# Patient Record
Sex: Female | Born: 1965 | Race: White | Hispanic: No | Marital: Married | Smoking: Current every day smoker
Health system: Southern US, Community
[De-identification: ages and names within clinical notes are randomized; demographics above are authoritative.]

## PROBLEM LIST (undated history)

## (undated) ENCOUNTER — Emergency Department (HOSPITAL_COMMUNITY): Admission: EM | Payer: PRIVATE HEALTH INSURANCE | Source: Home / Self Care

## (undated) DIAGNOSIS — F32A Depression, unspecified: Secondary | ICD-10-CM

## (undated) DIAGNOSIS — F419 Anxiety disorder, unspecified: Secondary | ICD-10-CM

## (undated) DIAGNOSIS — F329 Major depressive disorder, single episode, unspecified: Secondary | ICD-10-CM

## (undated) DIAGNOSIS — K219 Gastro-esophageal reflux disease without esophagitis: Secondary | ICD-10-CM

## (undated) HISTORY — DX: Depression, unspecified: F32.A

## (undated) HISTORY — DX: Anxiety disorder, unspecified: F41.9

## (undated) HISTORY — DX: Major depressive disorder, single episode, unspecified: F32.9

---

## 1997-05-13 ENCOUNTER — Inpatient Hospital Stay (HOSPITAL_COMMUNITY): Admission: AD | Admit: 1997-05-13 | Discharge: 1997-05-16 | Payer: Self-pay | Admitting: *Deleted

## 2003-01-19 HISTORY — PX: TUBAL LIGATION: SHX77

## 2006-06-21 ENCOUNTER — Other Ambulatory Visit: Admission: RE | Admit: 2006-06-21 | Discharge: 2006-06-21 | Payer: Self-pay | Admitting: Obstetrics and Gynecology

## 2008-02-06 ENCOUNTER — Other Ambulatory Visit: Admission: RE | Admit: 2008-02-06 | Discharge: 2008-02-06 | Payer: Self-pay | Admitting: Obstetrics and Gynecology

## 2008-02-12 ENCOUNTER — Encounter: Admission: RE | Admit: 2008-02-12 | Discharge: 2008-02-12 | Payer: Self-pay | Admitting: Obstetrics and Gynecology

## 2009-06-12 ENCOUNTER — Ambulatory Visit (HOSPITAL_COMMUNITY): Payer: Self-pay | Admitting: Psychiatry

## 2009-06-24 ENCOUNTER — Ambulatory Visit (HOSPITAL_COMMUNITY): Payer: Self-pay | Admitting: Psychiatry

## 2009-06-26 ENCOUNTER — Ambulatory Visit (HOSPITAL_COMMUNITY): Payer: Self-pay | Admitting: Psychiatry

## 2009-07-02 ENCOUNTER — Ambulatory Visit (HOSPITAL_COMMUNITY): Payer: Self-pay | Admitting: Psychiatry

## 2009-07-22 ENCOUNTER — Ambulatory Visit (HOSPITAL_COMMUNITY): Payer: Self-pay | Admitting: Psychiatry

## 2009-07-28 ENCOUNTER — Ambulatory Visit (HOSPITAL_COMMUNITY): Payer: Self-pay | Admitting: Psychiatry

## 2009-08-19 ENCOUNTER — Ambulatory Visit (HOSPITAL_COMMUNITY): Payer: Self-pay | Admitting: Psychiatry

## 2009-08-26 ENCOUNTER — Ambulatory Visit (HOSPITAL_COMMUNITY): Payer: Self-pay | Admitting: Psychiatry

## 2009-09-23 ENCOUNTER — Ambulatory Visit (HOSPITAL_COMMUNITY): Payer: Self-pay | Admitting: Psychiatry

## 2009-09-30 ENCOUNTER — Other Ambulatory Visit: Admission: RE | Admit: 2009-09-30 | Discharge: 2009-09-30 | Payer: Self-pay | Admitting: Obstetrics and Gynecology

## 2009-10-28 ENCOUNTER — Ambulatory Visit (HOSPITAL_COMMUNITY): Payer: Self-pay | Admitting: Psychiatry

## 2010-01-01 ENCOUNTER — Ambulatory Visit (HOSPITAL_COMMUNITY): Payer: Self-pay | Admitting: Psychiatry

## 2010-02-03 ENCOUNTER — Ambulatory Visit (HOSPITAL_COMMUNITY)
Admission: RE | Admit: 2010-02-03 | Discharge: 2010-02-03 | Payer: Self-pay | Source: Home / Self Care | Attending: Psychiatry | Admitting: Psychiatry

## 2010-03-03 ENCOUNTER — Encounter (HOSPITAL_COMMUNITY): Payer: Self-pay | Admitting: Psychiatry

## 2010-03-17 ENCOUNTER — Encounter (INDEPENDENT_AMBULATORY_CARE_PROVIDER_SITE_OTHER): Payer: 59 | Admitting: Psychiatry

## 2010-03-17 DIAGNOSIS — F319 Bipolar disorder, unspecified: Secondary | ICD-10-CM

## 2010-05-14 ENCOUNTER — Encounter (INDEPENDENT_AMBULATORY_CARE_PROVIDER_SITE_OTHER): Payer: 59 | Admitting: Psychiatry

## 2010-05-14 DIAGNOSIS — F3189 Other bipolar disorder: Secondary | ICD-10-CM

## 2010-07-14 ENCOUNTER — Encounter (HOSPITAL_COMMUNITY): Payer: 59 | Admitting: Psychiatry

## 2011-01-02 ENCOUNTER — Other Ambulatory Visit (HOSPITAL_COMMUNITY): Payer: Self-pay | Admitting: Psychiatry

## 2011-05-27 ENCOUNTER — Other Ambulatory Visit: Payer: Self-pay | Admitting: Family Medicine

## 2011-05-27 DIAGNOSIS — Z1231 Encounter for screening mammogram for malignant neoplasm of breast: Secondary | ICD-10-CM

## 2011-07-06 ENCOUNTER — Ambulatory Visit: Payer: 59 | Attending: Orthopedic Surgery | Admitting: Physical Therapy

## 2011-07-06 DIAGNOSIS — M25619 Stiffness of unspecified shoulder, not elsewhere classified: Secondary | ICD-10-CM | POA: Insufficient documentation

## 2011-07-06 DIAGNOSIS — IMO0001 Reserved for inherently not codable concepts without codable children: Secondary | ICD-10-CM | POA: Insufficient documentation

## 2011-07-06 DIAGNOSIS — M25519 Pain in unspecified shoulder: Secondary | ICD-10-CM | POA: Insufficient documentation

## 2011-07-20 ENCOUNTER — Ambulatory Visit: Payer: 59 | Attending: Orthopedic Surgery | Admitting: Physical Therapy

## 2011-07-20 DIAGNOSIS — M25519 Pain in unspecified shoulder: Secondary | ICD-10-CM | POA: Insufficient documentation

## 2011-07-20 DIAGNOSIS — M25619 Stiffness of unspecified shoulder, not elsewhere classified: Secondary | ICD-10-CM | POA: Insufficient documentation

## 2011-07-20 DIAGNOSIS — IMO0001 Reserved for inherently not codable concepts without codable children: Secondary | ICD-10-CM | POA: Insufficient documentation

## 2011-07-27 ENCOUNTER — Encounter: Payer: 59 | Admitting: Physical Therapy

## 2011-08-03 ENCOUNTER — Ambulatory Visit: Payer: 59 | Admitting: Physical Therapy

## 2011-08-18 ENCOUNTER — Encounter: Payer: 59 | Admitting: Physical Therapy

## 2011-09-01 ENCOUNTER — Encounter: Payer: 59 | Admitting: Physical Therapy

## 2011-10-25 ENCOUNTER — Ambulatory Visit
Admission: RE | Admit: 2011-10-25 | Discharge: 2011-10-25 | Disposition: A | Discharge status: Home / Self Care | Payer: 59 | Source: Ambulatory Visit | Attending: Family Medicine | Admitting: Family Medicine

## 2011-10-25 DIAGNOSIS — Z1231 Encounter for screening mammogram for malignant neoplasm of breast: Secondary | ICD-10-CM

## 2011-10-27 ENCOUNTER — Other Ambulatory Visit: Payer: Self-pay | Admitting: Family Medicine

## 2011-10-27 DIAGNOSIS — R928 Other abnormal and inconclusive findings on diagnostic imaging of breast: Secondary | ICD-10-CM

## 2011-11-22 ENCOUNTER — Other Ambulatory Visit: Payer: Self-pay | Admitting: Family Medicine

## 2011-11-22 ENCOUNTER — Ambulatory Visit
Admission: RE | Admit: 2011-11-22 | Discharge: 2011-11-22 | Disposition: A | Discharge status: Home / Self Care | Payer: 59 | Source: Ambulatory Visit | Attending: Family Medicine | Admitting: Family Medicine

## 2011-11-22 DIAGNOSIS — R928 Other abnormal and inconclusive findings on diagnostic imaging of breast: Secondary | ICD-10-CM

## 2011-12-03 ENCOUNTER — Ambulatory Visit
Admission: RE | Admit: 2011-12-03 | Discharge: 2011-12-03 | Disposition: A | Discharge status: Home / Self Care | Payer: 59 | Source: Ambulatory Visit | Attending: Family Medicine | Admitting: Family Medicine

## 2011-12-03 DIAGNOSIS — R928 Other abnormal and inconclusive findings on diagnostic imaging of breast: Secondary | ICD-10-CM

## 2012-05-10 ENCOUNTER — Other Ambulatory Visit: Payer: Self-pay | Admitting: Family Medicine

## 2012-05-10 ENCOUNTER — Other Ambulatory Visit: Payer: Self-pay | Admitting: Nurse Practitioner

## 2012-09-07 ENCOUNTER — Encounter: Payer: Self-pay | Admitting: Nurse Practitioner

## 2012-09-07 ENCOUNTER — Ambulatory Visit (INDEPENDENT_AMBULATORY_CARE_PROVIDER_SITE_OTHER): Payer: 59 | Admitting: Nurse Practitioner

## 2012-09-07 VITALS — BP 102/64 | Ht 63.0 in | Wt 118.0 lb

## 2012-09-07 DIAGNOSIS — G4700 Insomnia, unspecified: Secondary | ICD-10-CM

## 2012-09-07 DIAGNOSIS — F319 Bipolar disorder, unspecified: Secondary | ICD-10-CM

## 2012-09-07 MED ORDER — DIVALPROEX SODIUM ER 500 MG PO TB24: ORAL_TABLET | ORAL | Status: DC

## 2012-09-07 MED ORDER — ALPRAZOLAM 0.5 MG PO TABS: 0.5000 mg | ORAL_TABLET | Freq: Every evening | ORAL | Status: DC | PRN

## 2012-09-07 MED ORDER — QUETIAPINE FUMARATE 25 MG PO TABS: ORAL_TABLET | ORAL | Status: DC

## 2012-09-11 ENCOUNTER — Encounter: Payer: Self-pay | Admitting: Nurse Practitioner

## 2012-09-11 DIAGNOSIS — F319 Bipolar disorder, unspecified: Secondary | ICD-10-CM | POA: Insufficient documentation

## 2012-09-11 NOTE — Assessment & Plan Note (Signed)
Meds ordered this encounter  Medications  . DISCONTD: QUEtiapine Fumarate (SEROQUEL PO)    Sig: Take by mouth.  . DISCONTD: ALPRAZolam (XANAX) 0.5 MG tablet    Sig: Take 0.5 mg by mouth at bedtime as needed for sleep.  Marland Kitchen ALPRAZolam (XANAX) 0.5 MG tablet    Sig: Take 1 tablet (0.5 mg total) by mouth at bedtime as needed for sleep.    Dispense:  30 tablet    Refill:  5    Order Specific Question:  Supervising Provider    Answer:  Merlyn Albert [[id number]]  . QUEtiapine (SEROQUEL) 25 MG tablet    Sig: 3 po QHS    Dispense:  270 tablet    Refill:  1    Order Specific Question:  Supervising Provider    Answer:  Merlyn Albert [[id number]]  . divalproex (DEPAKOTE ER) 500 MG 24 hr tablet    Sig: 2 po qd    Dispense:  180 tablet    Refill:  1    Order Specific Question:  Supervising Provider    Answer:  Merlyn Albert [[id number]]   If no problems recheck in 6 months, call back sooner if needed. Reminded patient about importance of regular preventive health physicals and routine lab wo

## 2012-09-11 NOTE — Progress Notes (Signed)
Subjective:  Patient presents for recheck on her bipolar disorder, depression and anxiety. Symptoms have been stable. Currently regimen seems to be working well using Xanax at bedtime for sleep. Has been under more stress lately due to personal issues. Defers need for psychiatric care at this time.  Objective:   BP 102/64  Ht 5\' 3"  (1.6 m)  Wt 118 lb (53.524 kg)  BMI 20.91 kg/m2 NAD. Alert, oriented. Mildly anxious affect. Lungs clear. Heart regular rate rhythm.  Assessment:Bipolar disorder, unspecified  Insomnia  Plan: Meds ordered this encounter  Medications  . DISCONTD: QUEtiapine Fumarate (SEROQUEL PO)    Sig: Take by mouth.  . DISCONTD: ALPRAZolam (XANAX) 0.5 MG tablet    Sig: Take 0.5 mg by mouth at bedtime as needed for sleep.  Marland Kitchen ALPRAZolam (XANAX) 0.5 MG tablet    Sig: Take 1 tablet (0.5 mg total) by mouth at bedtime as needed for sleep.    Dispense:  30 tablet    Refill:  5    Order Specific Question:  Supervising Provider    Answer:  Merlyn Albert [[id number]]  . QUEtiapine (SEROQUEL) 25 MG tablet    Sig: 3 po QHS    Dispense:  270 tablet    Refill:  1    Order Specific Question:  Supervising Provider    Answer:  Merlyn Albert [[id number]]  . divalproex (DEPAKOTE ER) 500 MG 24 hr tablet    Sig: 2 po qd    Dispense:  180 tablet    Refill:  1    Order Specific Question:  Supervising Provider    Answer:  Merlyn Albert [[id number]]   If no problems recheck in 6 months, call back sooner if needed. Reminded patient about importance of regular preventive health physicals and routine lab work.

## 2012-09-14 ENCOUNTER — Ambulatory Visit (INDEPENDENT_AMBULATORY_CARE_PROVIDER_SITE_OTHER): Payer: 59 | Admitting: Nurse Practitioner

## 2012-09-14 ENCOUNTER — Encounter: Payer: Self-pay | Admitting: Nurse Practitioner

## 2012-09-14 VITALS — BP 102/68 | Temp 98.6°F | Ht 62.0 in | Wt 109.0 lb

## 2012-09-14 DIAGNOSIS — J069 Acute upper respiratory infection, unspecified: Secondary | ICD-10-CM

## 2012-09-15 ENCOUNTER — Ambulatory Visit: Payer: 59 | Admitting: Nurse Practitioner

## 2012-09-16 NOTE — Progress Notes (Signed)
Subjective:  Presents complaints of sore throat and ear pain that began 4 days ago. Slight fever times. No cough runny nose or wheezing. No changes in her headaches. No vomiting diarrhea or abdominal pain. No acid reflux or heartburn.  Objective:   BP 102/68  Temp(Src) 98.6 F (37 C) (Oral)  Ht 5\' 2"  (1.575 m)  Wt 109 lb (49.442 kg)  BMI 19.93 kg/m2 NAD. Alert, oriented. TMs clear effusion, no erythema. Pharynx injected with PND noted. Neck supple with mild soft slightly tender adenopathy. Lungs clear. Heart regular rate rhythm.  Assessment:Acute upper respiratory infections of unspecified site  Plan: Given a written prescription for Z-Pak as directed per patient request. OTC meds as directed for congestion. Call back next week if no improvement, sooner if worse.

## 2012-11-15 ENCOUNTER — Other Ambulatory Visit: Payer: Self-pay

## 2012-11-15 ENCOUNTER — Other Ambulatory Visit: Payer: Self-pay | Admitting: Family Medicine

## 2012-11-15 DIAGNOSIS — Z1231 Encounter for screening mammogram for malignant neoplasm of breast: Secondary | ICD-10-CM

## 2012-12-04 ENCOUNTER — Ambulatory Visit: Payer: 59

## 2012-12-04 ENCOUNTER — Ambulatory Visit (HOSPITAL_COMMUNITY)
Admission: RE | Admit: 2012-12-04 | Discharge: 2012-12-04 | Disposition: A | Discharge status: Home / Self Care | Payer: 59 | Source: Ambulatory Visit | Attending: Family Medicine | Admitting: Family Medicine

## 2012-12-04 DIAGNOSIS — Z1231 Encounter for screening mammogram for malignant neoplasm of breast: Secondary | ICD-10-CM | POA: Insufficient documentation

## 2013-04-07 ENCOUNTER — Other Ambulatory Visit: Payer: Self-pay | Admitting: Nurse Practitioner

## 2013-05-14 ENCOUNTER — Ambulatory Visit (INDEPENDENT_AMBULATORY_CARE_PROVIDER_SITE_OTHER): Payer: PRIVATE HEALTH INSURANCE | Admitting: Nurse Practitioner

## 2013-05-14 ENCOUNTER — Encounter: Payer: Self-pay | Admitting: Nurse Practitioner

## 2013-05-14 VITALS — BP 112/82 | Ht 62.0 in | Wt 119.0 lb

## 2013-05-14 DIAGNOSIS — F319 Bipolar disorder, unspecified: Secondary | ICD-10-CM

## 2013-05-14 MED ORDER — DIVALPROEX SODIUM ER 500 MG PO TB24: ORAL_TABLET | ORAL | Status: DC

## 2013-05-14 MED ORDER — ALPRAZOLAM 0.5 MG PO TABS: 0.5000 mg | ORAL_TABLET | Freq: Three times a day (TID) | ORAL | Status: DC | PRN

## 2013-05-14 MED ORDER — QUETIAPINE FUMARATE 25 MG PO TABS: ORAL_TABLET | ORAL | Status: DC

## 2013-05-16 ENCOUNTER — Encounter: Payer: Self-pay | Admitting: Nurse Practitioner

## 2013-05-16 ENCOUNTER — Other Ambulatory Visit: Payer: Self-pay | Admitting: Nurse Practitioner

## 2013-05-16 DIAGNOSIS — F319 Bipolar disorder, unspecified: Secondary | ICD-10-CM

## 2013-05-16 NOTE — Progress Notes (Signed)
Subjective:  Presents for recheck. Overall symptoms are stable but having more anxiety due to situational stress. Her husband has major health issues. Her other meds are the same, asking for an increase in xanax for now.   Objective:   BP 112/82  Ht 5\' 2"  (1.575 m)  Wt 119 lb (53.978 kg)  BMI 21.76 kg/m2 NAD. Alert,oriented. Thoughts logical, coherent and relevant. Lungs clear. Heart RRR.  Assessment:  Problem List Items Addressed This Visit     Other   Bipolar disorder, unspecified - Primary (Chronic)     Plan:  Meds ordered this encounter  Medications  . QUEtiapine (SEROQUEL) 25 MG tablet    Sig: 3 po QHS    Dispense:  270 tablet    Refill:  0    Order Specific Question:  Supervising Provider    Answer:  Merlyn AlbertLUKING, WILLIAM S [[id number]]  . divalproex (DEPAKOTE ER) 500 MG 24 hr tablet    Sig: 2 po qd    Dispense:  180 tablet    Refill:  0    Order Specific Question:  Supervising Provider    Answer:  Merlyn AlbertLUKING, WILLIAM S [[id number]]  . ALPRAZolam (XANAX) 0.5 MG tablet    Sig: Take 1 tablet (0.5 mg total) by mouth 3 (three) times daily as needed for anxiety.    Dispense:  90 tablet    Refill:  2    May refill monthly    Order Specific Question:  Supervising Provider    Answer:  Merlyn AlbertLUKING, WILLIAM S [[id number]]  discussed our updated policy regarding treatment of bipolar disorder. Patient understands that she must make appointment with mental health for further refills. Given information on local mental health providers. Call back in meantime if any problems. Seek help immediately if severe issues.

## 2013-05-28 ENCOUNTER — Telehealth: Payer: Self-pay | Admitting: Family Medicine

## 2013-05-28 NOTE — Telephone Encounter (Signed)
Spoke with pt, explained that I haven't gotten to her referral as of yet but nothing special required, she may call to set up, [facility] [place], also suggested calling her insurance to get list of providers to choose from, pt will set up appt

## 2013-05-28 NOTE — Telephone Encounter (Signed)
Patient would like to know status on referral to psychiatry.

## 2013-07-10 ENCOUNTER — Ambulatory Visit (INDEPENDENT_AMBULATORY_CARE_PROVIDER_SITE_OTHER): Payer: PRIVATE HEALTH INSURANCE | Admitting: Psychiatry

## 2013-07-10 ENCOUNTER — Encounter (HOSPITAL_COMMUNITY): Payer: Self-pay | Admitting: Psychiatry

## 2013-07-10 VITALS — BP 110/80 | Ht 62.0 in | Wt 114.0 lb

## 2013-07-10 DIAGNOSIS — F410 Panic disorder [episodic paroxysmal anxiety] without agoraphobia: Secondary | ICD-10-CM

## 2013-07-10 DIAGNOSIS — F319 Bipolar disorder, unspecified: Secondary | ICD-10-CM

## 2013-07-10 DIAGNOSIS — F39 Unspecified mood [affective] disorder: Secondary | ICD-10-CM

## 2013-07-10 MED ORDER — QUETIAPINE FUMARATE 25 MG PO TABS: ORAL_TABLET | ORAL | Status: DC

## 2013-07-10 MED ORDER — DIVALPROEX SODIUM ER 500 MG PO TB24: ORAL_TABLET | ORAL | Status: DC

## 2013-07-10 MED ORDER — ALPRAZOLAM 0.5 MG PO TABS: 0.5000 mg | ORAL_TABLET | Freq: Three times a day (TID) | ORAL | Status: DC | PRN

## 2013-07-10 MED ORDER — ALPRAZOLAM 0.5 MG PO TABS: 0.5000 mg | ORAL_TABLET | ORAL | Status: DC | PRN
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
## 2013-08-08 LAB — HEPATIC FUNCTION PANEL
ALT: <8 U/L (ref 0–35)
AST: 13 U/L (ref 0–37)
Albumin: 4.2 g/dL (ref 3.5–5.2)
Alkaline Phosphatase: 43 U/L (ref 39–117)
BILIRUBIN DIRECT: 0.1 mg/dL (ref 0.0–0.3)
BILIRUBIN TOTAL: 0.4 mg/dL (ref 0.2–1.2)
Indirect Bilirubin: 0.3 mg/dL (ref 0.2–1.2)
Total Protein: 6.1 g/dL (ref 6.0–8.3)

## 2013-08-08 LAB — VALPROIC ACID LEVEL: Valproic Acid Lvl: 86.6 ug/mL (ref 50.0–100.0)

## 2013-09-26 ENCOUNTER — Telehealth (HOSPITAL_COMMUNITY): Payer: Self-pay | Admitting: *Deleted

## 2013-09-26 NOTE — Telephone Encounter (Signed)
Pt calling stating she only have 1 night of her Depakote and Xanax. Pt states have an up coming appt and 10-11-13. Pt last received refills for Xanax (TID) 07-10-13 with 2 refills. Pt Depakote was last filled 07-10-13 180 tablets with 0 refills. Pt was wondering if Dr. Tenny Craw could fill these medications before appt date?

## 2013-09-26 NOTE — Telephone Encounter (Signed)
Please call her pharmacy to see what is going on. She should not be out of meds

## 2013-09-27 NOTE — Telephone Encounter (Signed)
Spoke with pt about Dr. Tenny Craw decisions and she states she called her pharmacy. Per pt, her pharmacy informed her they do have refills for her.

## 2013-10-11 ENCOUNTER — Ambulatory Visit (INDEPENDENT_AMBULATORY_CARE_PROVIDER_SITE_OTHER): Payer: PRIVATE HEALTH INSURANCE | Admitting: Psychiatry

## 2013-10-11 ENCOUNTER — Encounter (HOSPITAL_COMMUNITY): Payer: Self-pay | Admitting: Psychiatry

## 2013-10-11 VITALS — BP 123/87 | HR 95 | Ht 62.0 in | Wt 113.6 lb

## 2013-10-11 DIAGNOSIS — F39 Unspecified mood [affective] disorder: Secondary | ICD-10-CM

## 2013-10-11 DIAGNOSIS — F410 Panic disorder [episodic paroxysmal anxiety] without agoraphobia: Secondary | ICD-10-CM

## 2013-10-11 DIAGNOSIS — F319 Bipolar disorder, unspecified: Secondary | ICD-10-CM

## 2013-10-11 MED ORDER — DIVALPROEX SODIUM ER 500 MG PO TB24: ORAL_TABLET | ORAL | Status: DC

## 2013-10-11 MED ORDER — ALPRAZOLAM 0.5 MG PO TABS: 0.5000 mg | ORAL_TABLET | Freq: Three times a day (TID) | ORAL | Status: DC | PRN

## 2013-10-11 MED ORDER — QUETIAPINE FUMARATE 25 MG PO TABS: ORAL_TABLET | ORAL | Status: DC
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
## 2013-10-31 ENCOUNTER — Encounter: Payer: Self-pay | Admitting: Nurse Practitioner

## 2013-10-31 ENCOUNTER — Ambulatory Visit (INDEPENDENT_AMBULATORY_CARE_PROVIDER_SITE_OTHER): Payer: PRIVATE HEALTH INSURANCE | Admitting: Nurse Practitioner

## 2013-10-31 VITALS — BP 110/72 | Ht 62.0 in | Wt 119.0 lb

## 2013-10-31 DIAGNOSIS — Z79899 Other long term (current) drug therapy: Secondary | ICD-10-CM

## 2013-10-31 DIAGNOSIS — Z0000 Encounter for general adult medical examination without abnormal findings: Secondary | ICD-10-CM

## 2013-10-31 DIAGNOSIS — R5383 Other fatigue: Secondary | ICD-10-CM

## 2013-10-31 DIAGNOSIS — Z139 Encounter for screening, unspecified: Secondary | ICD-10-CM

## 2013-10-31 DIAGNOSIS — Z01419 Encounter for gynecological examination (general) (routine) without abnormal findings: Secondary | ICD-10-CM

## 2013-10-31 MED ORDER — LIDOCAINE-HYDROCORTISONE ACE 3-0.5 % RE CREA: 1.0000 | TOPICAL_CREAM | Freq: Two times a day (BID) | RECTAL | Status: DC

## 2013-10-31 NOTE — Progress Notes (Signed)
   Subjective:    Patient ID: Anne Logan, female    DOB: 05/26/1965, 48 y.o.   MRN: [id number]  HPI presents for her wellness exam. Married, same sexual partner. Having regular cycles once a month normal flow 6-7 days. Regular vision and dental exams. Eating healthy. Has a sedentary job. No regular exercise. Gets regular followup with mental health.    Review of Systems  Constitutional: Negative for fever, activity change, appetite change and fatigue.  HENT: Negative for dental problem, ear pain, sinus pressure and sore throat.   Respiratory: Negative for cough, chest tightness, shortness of breath and wheezing.   Cardiovascular: Negative for chest pain.  Gastrointestinal: Positive for constipation. Negative for nausea, vomiting, abdominal pain, diarrhea, blood in stool and abdominal distention.  Genitourinary: Negative for dysuria, urgency, frequency, vaginal discharge, enuresis, difficulty urinating, genital sores, menstrual problem and pelvic pain.  Psychiatric/Behavioral: Positive for dysphoric mood.       Objective:   Physical Exam  Vitals reviewed. Constitutional: She is oriented to person, place, and time. She appears well-developed. No distress.  HENT:  Right Ear: External ear normal.  Left Ear: External ear normal.  Mouth/Throat: Oropharynx is clear and moist.  Neck: Normal range of motion. Neck supple. No tracheal deviation present. No thyromegaly present.  Cardiovascular: Normal rate, regular rhythm and normal heart sounds.  Exam reveals no gallop.   No murmur heard. Pulmonary/Chest: Effort normal and breath sounds normal.  Abdominal: Soft. She exhibits no distension. There is no tenderness.  Genitourinary: Vagina normal and uterus normal. No vaginal discharge found.  External GU no rashes or lesions. 2 tiny skin tags noted on the lower portion of the left labia. Vagina no discharge. Cervix normal limit in appearance. Bimanual exam no tenderness or obvious masses noted.    Musculoskeletal: She exhibits no edema.  Lymphadenopathy:    She has no cervical adenopathy.  Neurological: She is alert and oriented to person, place, and time.  Skin: Skin is warm and dry. No rash noted.  Psychiatric: She has a normal mood and affect. Her behavior is normal.   Breast exam: Some fine nodularity, no dominant masses. Axillae no adenopathy.        Assessment & Plan:  Well woman exam  Other fatigue - Plan: Hepatic function panel, Basic metabolic panel, CBC with Differential, TSH, Vit D  25 hydroxy (rtn osteoporosis monitoring)  Screening - Plan: Lipid panel, Hepatic function panel, Basic metabolic panel  High risk medication use - Plan: Hepatic function panel, Basic metabolic panel  Recommend regular activity and daily calcium/vitamin D supplementation. Defers flu vaccine. Patient states she's had a mammogram this year but it was done in November of last year. Nurse will call her tomorrow to remind her about mammogram. Return in about 1 year (around 11/01/2014).

## 2014-01-14 ENCOUNTER — Ambulatory Visit (HOSPITAL_COMMUNITY): Payer: Self-pay | Admitting: Psychiatry

## 2014-01-17 ENCOUNTER — Encounter: Payer: Self-pay | Admitting: Nurse Practitioner

## 2014-01-17 ENCOUNTER — Ambulatory Visit (INDEPENDENT_AMBULATORY_CARE_PROVIDER_SITE_OTHER): Payer: PRIVATE HEALTH INSURANCE | Admitting: Nurse Practitioner

## 2014-01-17 VITALS — BP 130/78 | Temp 99.0°F | Ht 62.0 in | Wt 117.8 lb

## 2014-01-17 DIAGNOSIS — J300 Vasomotor rhinitis: Secondary | ICD-10-CM

## 2014-01-17 MED ORDER — METHYLPREDNISOLONE ACETATE 40 MG/ML IJ SUSP
40.0000 mg | Freq: Once | INTRAMUSCULAR | Status: AC
  Administered 2014-01-17: 40 mg via INTRAMUSCULAR

## 2014-01-17 MED ORDER — PREDNISONE 20 MG PO TABS: ORAL_TABLET | ORAL | Status: DC

## 2014-01-20 ENCOUNTER — Encounter: Payer: Self-pay | Admitting: Nurse Practitioner

## 2014-01-20 NOTE — Progress Notes (Addendum)
Subjective:  Presents for c/o generalized sinus pressure and ear pain for 2 1/2 weeks. No drainage from ear. No relief with Cortisporin drops. No fever. No relief with Sudafed or Nasacort. No headache or cough. Sore throat.  Objective:   BP 130/78 mmHg  Temp(Src) 99 F (37.2 C) (Oral)  Ht  (1.575 m)  Wt 117 lb 12.8 oz (53.434 kg)  BMI 21.54 kg/m2 NAD. Alert, oriented. Cloudy effusion bilat; no erythema. Pharynx clear. Neck supple with mild anterior adenopathy. Lungs clear. Heart RRR.     Assessment: Vasomotor rhinitis - Plan: methylPREDNISolone acetate (DEPO-MEDROL) injection 40 mg  Plan:  Meds ordered this encounter  Medications  . DISCONTD: predniSONE (DELTASONE) 20 MG tablet    Sig: One po BID x 5 d    Dispense:  10 tablet    Refill:  0    Order Specific Question:  Supervising Provider    Answer:  Merlyn Albert [[id number]]  . methylPREDNISolone acetate (DEPO-MEDROL) injection 40 mg    Sig:   . predniSONE (DELTASONE) 20 MG tablet    Sig: One po BID x 5 d    Dispense:  10 tablet    Refill:  0   Restart nasacort and antihistamine daily. Use Sudafed sparingly as directed.  Call back if worsens or persists. Given Rx for prednisone if needed.

## 2014-01-22 ENCOUNTER — Other Ambulatory Visit: Payer: Self-pay | Admitting: Nurse Practitioner

## 2014-01-22 ENCOUNTER — Ambulatory Visit (INDEPENDENT_AMBULATORY_CARE_PROVIDER_SITE_OTHER): Payer: PRIVATE HEALTH INSURANCE | Admitting: Psychiatry

## 2014-01-22 ENCOUNTER — Encounter (HOSPITAL_COMMUNITY): Payer: Self-pay | Admitting: Psychiatry

## 2014-01-22 VITALS — BP 117/75 | HR 90 | Ht 62.0 in | Wt 116.8 lb

## 2014-01-22 DIAGNOSIS — F3162 Bipolar disorder, current episode mixed, moderate: Secondary | ICD-10-CM

## 2014-01-22 DIAGNOSIS — Z1231 Encounter for screening mammogram for malignant neoplasm of breast: Secondary | ICD-10-CM

## 2014-01-22 DIAGNOSIS — F39 Unspecified mood [affective] disorder: Secondary | ICD-10-CM

## 2014-01-22 DIAGNOSIS — F4310 Post-traumatic stress disorder, unspecified: Secondary | ICD-10-CM

## 2014-01-22 MED ORDER — QUETIAPINE FUMARATE 25 MG PO TABS: ORAL_TABLET | ORAL | Status: DC

## 2014-01-22 MED ORDER — DIVALPROEX SODIUM ER 500 MG PO TB24: ORAL_TABLET | ORAL | Status: DC

## 2014-01-22 MED ORDER — ALPRAZOLAM 0.5 MG PO TABS: 0.5000 mg | ORAL_TABLET | Freq: Three times a day (TID) | ORAL | Status: DC | PRN
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
## 2014-01-24 ENCOUNTER — Telehealth: Payer: Self-pay | Admitting: Nurse Practitioner

## 2014-01-24 NOTE — Telephone Encounter (Signed)
2 options: #1: start Nasacort AQ daily over the counter as directed. AND/OR #2: refer to ENT if pressure continues

## 2014-01-24 NOTE — Telephone Encounter (Signed)
Pt states Anne Logan told her to call back if after she finished her prednisone pack An her ear was still bothering her to let you know  [facility]   No fever, ears stopped up only, no popping in them

## 2014-01-24 NOTE — Telephone Encounter (Signed)
Patient notified and is going to try Nasacort first. She said she would call back to be referred to ENT if the pressure continues.

## 2014-02-25 ENCOUNTER — Ambulatory Visit (HOSPITAL_COMMUNITY): Payer: Self-pay

## 2014-04-08 ENCOUNTER — Ambulatory Visit (HOSPITAL_COMMUNITY): Payer: Self-pay

## 2014-05-20 ENCOUNTER — Encounter (HOSPITAL_COMMUNITY): Payer: Self-pay | Admitting: Psychiatry

## 2014-05-20 ENCOUNTER — Ambulatory Visit (INDEPENDENT_AMBULATORY_CARE_PROVIDER_SITE_OTHER): Payer: PRIVATE HEALTH INSURANCE | Admitting: Psychiatry

## 2014-05-20 VITALS — BP 124/83 | HR 98 | Ht 62.0 in | Wt 122.6 lb

## 2014-05-20 DIAGNOSIS — F39 Unspecified mood [affective] disorder: Secondary | ICD-10-CM | POA: Diagnosis not present

## 2014-05-20 DIAGNOSIS — F410 Panic disorder [episodic paroxysmal anxiety] without agoraphobia: Secondary | ICD-10-CM | POA: Diagnosis not present

## 2014-05-20 DIAGNOSIS — F3162 Bipolar disorder, current episode mixed, moderate: Secondary | ICD-10-CM

## 2014-05-20 MED ORDER — DIVALPROEX SODIUM ER 500 MG PO TB24: ORAL_TABLET | ORAL | Status: DC

## 2014-05-20 MED ORDER — ALPRAZOLAM 0.5 MG PO TABS: 0.5000 mg | ORAL_TABLET | Freq: Three times a day (TID) | ORAL | Status: DC | PRN

## 2014-05-20 MED ORDER — QUETIAPINE FUMARATE 25 MG PO TABS: ORAL_TABLET | ORAL | Status: DC
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
## 2014-05-23 LAB — HEPATIC FUNCTION PANEL
ALK PHOS: 49 U/L (ref 39–117)
ALT: <8 U/L (ref 0–35)
AST: 14 U/L (ref 0–37)
Albumin: 4 g/dL (ref 3.5–5.2)
BILIRUBIN DIRECT: 0.1 mg/dL (ref 0.0–0.3)
BILIRUBIN INDIRECT: 0.2 mg/dL (ref 0.2–1.2)
Total Bilirubin: 0.3 mg/dL (ref 0.2–1.2)
Total Protein: 6.4 g/dL (ref 6.0–8.3)

## 2014-05-23 LAB — VALPROIC ACID LEVEL: Valproic Acid Lvl: 89.4 ug/mL (ref 50.0–100.0)

## 2014-07-01 ENCOUNTER — Ambulatory Visit (HOSPITAL_COMMUNITY)
Admission: RE | Admit: 2014-07-01 | Discharge: 2014-07-01 | Disposition: A | Discharge status: Home / Self Care | Payer: PRIVATE HEALTH INSURANCE | Source: Ambulatory Visit | Attending: Nurse Practitioner | Admitting: Nurse Practitioner

## 2014-07-01 DIAGNOSIS — Z1231 Encounter for screening mammogram for malignant neoplasm of breast: Secondary | ICD-10-CM

## 2014-09-16 ENCOUNTER — Other Ambulatory Visit (HOSPITAL_COMMUNITY): Payer: Self-pay | Admitting: Psychiatry

## 2014-10-10 ENCOUNTER — Encounter: Payer: Self-pay | Admitting: Nurse Practitioner

## 2014-10-10 ENCOUNTER — Ambulatory Visit (INDEPENDENT_AMBULATORY_CARE_PROVIDER_SITE_OTHER): Payer: PRIVATE HEALTH INSURANCE | Admitting: Nurse Practitioner

## 2014-10-10 VITALS — BP 122/82 | Ht 62.0 in | Wt 129.2 lb

## 2014-10-10 DIAGNOSIS — M545 Low back pain, unspecified: Secondary | ICD-10-CM

## 2014-10-10 MED ORDER — CHLORZOXAZONE 500 MG PO TABS: ORAL_TABLET | ORAL | Status: DC

## 2014-10-15 ENCOUNTER — Encounter: Payer: Self-pay | Admitting: Nurse Practitioner

## 2014-10-15 NOTE — Progress Notes (Signed)
Subjective:  Presents for complaints of generalized low back pain for the past 8 weeks. Mainly bothers at nighttime. No specific history of injury. Localized to the lumbar area. No change in bowel or bladder habits. Worse with prolonged sitting bending or lifting. No abdominal pain.  Objective:   BP 122/82 mmHg  Ht  (1.575 m)  Wt 129 lb 3.2 oz (58.605 kg)  BMI 23.63 kg/m2 NAD. Alert, oriented. Lungs clear. Heart regular rate rhythm. Minimal tenderness to palpation of the upper lumbar paraspinal area bilateral. SLR negative bilateral. Reflexes normal limit lower extremities. Good active ROM of the back with minimal tenderness.  Assessment: Bilateral low back pain without sciatica  Plan:  Meds ordered this encounter  Medications  . chlorzoxazone (PARAFON) 500 MG tablet    Sig: One po qhs prn muscle spasms    Dispense:  30 tablet    Refill:  0    Order Specific Question:  Supervising Provider    Answer:  Merlyn Albert [[id number]]   Ice/heat to the back area. Ibuprofen as directed. Given copy of back exercises. Warning signs reviewed. Call back if worsens or persists.

## 2014-11-15 ENCOUNTER — Telehealth: Payer: Self-pay | Admitting: Nurse Practitioner

## 2014-11-15 NOTE — Telephone Encounter (Signed)
Please schedule MRI lumbar spine. Insurance may deny based on my notes

## 2014-11-15 NOTE — Telephone Encounter (Signed)
Called patient and informed [facility]-MRI lumbar spine may be denied based on office notes for last visit. Patient verbalized understanding and stated that she may not be able to do MRI if insurance does not pay for it. I informed patient also that usually no referral is need for to see chiropractor. Patient verbalized understanding and stated if she changes her mind about MRI she will call back.

## 2014-11-15 NOTE — Telephone Encounter (Signed)
Pt called stating that her back is not getting better and was told by Anne Jonesarolyn to call back if not any better. Pt stated that Anne Logan mentioned seeing a chiropractor and doing an mri. Pt would like to have those set up.

## 2014-12-19 ENCOUNTER — Telehealth (HOSPITAL_COMMUNITY): Payer: Self-pay | Admitting: *Deleted

## 2014-12-19 NOTE — Telephone Encounter (Signed)
Pt called stating she is out of her Xanax and need refills. Pt was unable to get on sch sooner due to no open slot for her to be scheduled earlier. Pt f/u appt is 02-03-15. Pt is aware that provider is out of office and have to wait until the 5th of December for a response and pt showed understanding.

## 2014-12-23 NOTE — Telephone Encounter (Signed)
You may send in 30 days with one refill 

## 2014-12-26 ENCOUNTER — Telehealth (HOSPITAL_COMMUNITY): Payer: Self-pay | Admitting: *Deleted

## 2014-12-26 MED ORDER — ALPRAZOLAM 0.5 MG PO TABS: 0.5000 mg | ORAL_TABLET | Freq: Three times a day (TID) | ORAL | Status: DC | PRN

## 2014-12-26 NOTE — Telephone Encounter (Signed)
Per Dr. Tenny Crawoss to call in 30 days with 1 refill of Xanax to pt pharmacy. Called pt pharmacy and spoke with Tripe and he stated that they will get script ready for pt.

## 2014-12-26 NOTE — Telephone Encounter (Signed)
Called pt pharmacy and spoke with Tripe and he stated that they will get script ready for pt

## 2015-02-03 ENCOUNTER — Encounter (HOSPITAL_COMMUNITY): Payer: Self-pay | Admitting: Psychiatry

## 2015-02-03 ENCOUNTER — Ambulatory Visit (INDEPENDENT_AMBULATORY_CARE_PROVIDER_SITE_OTHER): Payer: PRIVATE HEALTH INSURANCE | Admitting: Psychiatry

## 2015-02-03 ENCOUNTER — Ambulatory Visit (HOSPITAL_COMMUNITY): Payer: Self-pay | Admitting: Psychiatry

## 2015-02-03 VITALS — BP 130/99 | HR 100 | Ht 62.0 in | Wt 125.0 lb

## 2015-02-03 DIAGNOSIS — F410 Panic disorder [episodic paroxysmal anxiety] without agoraphobia: Secondary | ICD-10-CM

## 2015-02-03 DIAGNOSIS — F3162 Bipolar disorder, current episode mixed, moderate: Secondary | ICD-10-CM

## 2015-02-03 DIAGNOSIS — F39 Unspecified mood [affective] disorder: Secondary | ICD-10-CM | POA: Diagnosis not present

## 2015-02-03 MED ORDER — DIVALPROEX SODIUM ER 500 MG PO TB24: ORAL_TABLET | ORAL | Status: DC

## 2015-02-03 MED ORDER — QUETIAPINE FUMARATE 25 MG PO TABS: ORAL_TABLET | ORAL | Status: DC

## 2015-02-03 MED ORDER — ALPRAZOLAM 0.5 MG PO TABS: 0.5000 mg | ORAL_TABLET | Freq: Three times a day (TID) | ORAL | Status: DC | PRN
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
## 2015-03-14 ENCOUNTER — Telehealth: Payer: Self-pay | Admitting: Nurse Practitioner

## 2015-03-14 NOTE — Telephone Encounter (Signed)
[facility] 03/14/15

## 2015-03-14 NOTE — Telephone Encounter (Signed)
Pt was referred to a chiropractor by Eber Jones and they are wanting Eber Jones to order an MRI and complete blood work. Pt is wanting to know if this can be done without her coming in to be seen. Please advise. The chiropractor is Reatha Harps in [place]

## 2015-03-14 NOTE — Telephone Encounter (Signed)
Office visit will be needed to evaluate and discuss what to order if no other reason than insurance. She has not been seen since last fall. Thanks.

## 2015-03-14 NOTE — Telephone Encounter (Signed)
Discussed with pt

## 2015-03-25 ENCOUNTER — Ambulatory Visit (INDEPENDENT_AMBULATORY_CARE_PROVIDER_SITE_OTHER): Payer: PRIVATE HEALTH INSURANCE | Admitting: Nurse Practitioner

## 2015-03-25 ENCOUNTER — Encounter: Payer: Self-pay | Admitting: Nurse Practitioner

## 2015-03-25 VITALS — BP 112/74 | Ht 62.0 in | Wt 130.0 lb

## 2015-03-25 DIAGNOSIS — R5383 Other fatigue: Secondary | ICD-10-CM | POA: Diagnosis not present

## 2015-03-25 DIAGNOSIS — M545 Low back pain, unspecified: Secondary | ICD-10-CM

## 2015-03-25 DIAGNOSIS — M62838 Other muscle spasm: Secondary | ICD-10-CM

## 2015-03-25 DIAGNOSIS — Z13220 Encounter for screening for lipoid disorders: Secondary | ICD-10-CM | POA: Diagnosis not present

## 2015-03-25 DIAGNOSIS — M6249 Contracture of muscle, multiple sites: Secondary | ICD-10-CM

## 2015-03-26 ENCOUNTER — Telehealth: Payer: Self-pay | Admitting: Family Medicine

## 2015-03-26 NOTE — Telephone Encounter (Signed)
Pt was supposed to be getting a muscle relaxer med sent to [facility]  After her visit with Eber Jonesarolyn yesterday can we get that info and send this  In please

## 2015-03-27 ENCOUNTER — Encounter: Payer: Self-pay | Admitting: Nurse Practitioner

## 2015-03-27 ENCOUNTER — Other Ambulatory Visit: Payer: Self-pay | Admitting: Nurse Practitioner

## 2015-03-27 MED ORDER — BACLOFEN 10 MG PO TABS: 10.0000 mg | ORAL_TABLET | Freq: Three times a day (TID) | ORAL | Status: DC

## 2015-03-27 NOTE — Progress Notes (Signed)
Subjective:  Presents for complaints of persistent back pain that began several months ago see previous note from 10/10/14. No relief with muscle relaxants, chiropractic treatment, TENS unit, ice/heat applications. Has had adjustments on her back 3 times per week with no improvement. Symptoms only occur at nighttime. Never bother her during the day. A new mattress in November, no improvement in symptoms. Generalized low back pain which will radiate to the upper back area. Describes as a tightness and occasional burning pain. No change in bowel or bladder habits. No abdominal pain. Continues to be under tremendous stress. Generalized fatigue. Did not get lab work ordered in May.  Objective:   BP 112/74 mmHg  Ht 5\' 2"  (1.575 m)  Wt 130 lb (58.968 kg)  BMI 23.77 kg/m2 NAD. Alert, oriented. Lungs clear. Heart regular rate rhythm. Mild diffuse tenderness and tightness noted all along the upper and lower back area. SLR negative bilaterally. Reflexes normal limit lower extremities. Gait normal.  Assessment: Night muscle spasms - Plan: Magnesium  Bilateral low back pain without sciatica  Other fatigue - Plan: CBC with Differential/Platelet, Hepatic function panel, Basic metabolic panel, TSH, VITAMIN D 25 Hydroxy (Vit-D Deficiency, Fractures)  Screening for lipid disorders - Plan: Lipid panel  Plan: Patient to get lab work done as soon as possible. Based on results, will discuss further intervention for her back pain at that time. At end of visit patient mentions she's been having some shoulder issues, recommend recheck with her orthopedic specialist in [place], patient agrees.

## 2015-04-11 ENCOUNTER — Ambulatory Visit: Payer: PRIVATE HEALTH INSURANCE | Admitting: Nurse Practitioner

## 2015-04-15 ENCOUNTER — Other Ambulatory Visit (HOSPITAL_COMMUNITY): Payer: Self-pay | Admitting: Psychiatry

## 2015-04-28 ENCOUNTER — Ambulatory Visit (HOSPITAL_COMMUNITY): Payer: Self-pay | Admitting: Psychiatry

## 2015-04-29 ENCOUNTER — Ambulatory Visit (INDEPENDENT_AMBULATORY_CARE_PROVIDER_SITE_OTHER): Payer: PRIVATE HEALTH INSURANCE | Admitting: Psychiatry

## 2015-04-29 ENCOUNTER — Encounter (HOSPITAL_COMMUNITY): Payer: Self-pay | Admitting: Psychiatry

## 2015-04-29 VITALS — BP 140/83 | HR 105 | Ht 62.0 in | Wt 120.4 lb

## 2015-04-29 DIAGNOSIS — F39 Unspecified mood [affective] disorder: Secondary | ICD-10-CM

## 2015-04-29 DIAGNOSIS — F3162 Bipolar disorder, current episode mixed, moderate: Secondary | ICD-10-CM

## 2015-04-29 DIAGNOSIS — F410 Panic disorder [episodic paroxysmal anxiety] without agoraphobia: Secondary | ICD-10-CM

## 2015-04-29 MED ORDER — QUETIAPINE FUMARATE 25 MG PO TABS: ORAL_TABLET | ORAL | Status: DC

## 2015-04-29 MED ORDER — ALPRAZOLAM 0.5 MG PO TABS: 0.5000 mg | ORAL_TABLET | Freq: Three times a day (TID) | ORAL | Status: DC | PRN

## 2015-04-29 MED ORDER — DIVALPROEX SODIUM ER 500 MG PO TB24: ORAL_TABLET | ORAL | Status: DC
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
## 2015-05-01 ENCOUNTER — Telehealth (HOSPITAL_COMMUNITY): Payer: Self-pay | Admitting: *Deleted

## 2015-05-01 ENCOUNTER — Other Ambulatory Visit: Payer: Self-pay | Admitting: Nurse Practitioner

## 2015-05-01 LAB — CBC WITH DIFFERENTIAL/PLATELET
BASOS ABS: 46 {cells}/uL (ref 0–200)
BASOS PCT: 1 %
Eosinophils Absolute: 138 cells/uL (ref 15–500)
Eosinophils Relative: 3 %
HCT: 44.3 % (ref 35.0–45.0)
Hemoglobin: 14.7 g/dL (ref 11.7–15.5)
Lymphocytes Relative: 33 %
Lymphs Abs: 1518 cells/uL (ref 850–3900)
MCH: 31.7 pg (ref 27.0–33.0)
MCHC: 33.2 g/dL (ref 32.0–36.0)
MCV: 95.5 fL (ref 80.0–100.0)
MONOS PCT: 6 %
MPV: 10.2 fL (ref 7.5–12.5)
Monocytes Absolute: 276 cells/uL (ref 200–950)
NEUTROS ABS: 2622 {cells}/uL (ref 1500–7800)
NEUTROS PCT: 57 %
PLATELETS: 219 10*3/uL (ref 140–400)
RBC: 4.64 MIL/uL (ref 3.80–5.10)
RDW: 14.4 % (ref 11.0–15.0)
WBC: 4.6 10*3/uL (ref 3.8–10.8)

## 2015-05-01 LAB — BASIC METABOLIC PANEL
BUN: 15 mg/dL (ref 7–25)
CHLORIDE: 106 mmol/L (ref 98–110)
CO2: 24 mmol/L (ref 20–31)
Calcium: 9.2 mg/dL (ref 8.6–10.2)
Creat: 0.7 mg/dL (ref 0.50–1.10)
Glucose, Bld: 101 mg/dL — ABNORMAL HIGH (ref 65–99)
POTASSIUM: 4.2 mmol/L (ref 3.5–5.3)
SODIUM: 143 mmol/L (ref 135–146)

## 2015-05-01 LAB — MAGNESIUM: MAGNESIUM: 2.2 mg/dL (ref 1.5–2.5)

## 2015-05-01 LAB — HEPATIC FUNCTION PANEL
ALBUMIN: 4.2 g/dL (ref 3.6–5.1)
ALT: 15 U/L (ref 6–29)
AST: 19 U/L (ref 10–35)
Alkaline Phosphatase: 58 U/L (ref 33–115)
BILIRUBIN INDIRECT: 0.3 mg/dL (ref 0.2–1.2)
Bilirubin, Direct: 0.1 mg/dL (ref <=0.2)
TOTAL PROTEIN: 6.4 g/dL (ref 6.1–8.1)
Total Bilirubin: 0.4 mg/dL (ref 0.2–1.2)

## 2015-05-01 LAB — LIPID PANEL
CHOL/HDL RATIO: 3.7 ratio (ref <=5.0)
CHOLESTEROL: 195 mg/dL (ref 125–200)
HDL: 53 mg/dL (ref >=46)
LDL CALC: 105 mg/dL (ref <130)
TRIGLYCERIDES: 186 mg/dL — AB (ref <150)
VLDL: 37 mg/dL — AB (ref <30)

## 2015-05-01 LAB — TSH: TSH: 0.85 m[IU]/L

## 2015-05-01 NOTE — Telephone Encounter (Signed)
Ruby informed [facility] and they showed understanding.

## 2015-05-01 NOTE — Telephone Encounter (Signed)
Linda from [facility] called, said patient came in this morning with two orders, one from Dr. Gerda DissLuking, and one from Dr. Tenny Crawoss with the same lab order.  Bonita QuinLinda wants to know if they can take your lab order off since she had two orders.

## 2015-05-01 NOTE — Telephone Encounter (Signed)
yes

## 2015-05-02 ENCOUNTER — Ambulatory Visit (HOSPITAL_COMMUNITY): Payer: Self-pay | Admitting: Psychiatry

## 2015-05-02 LAB — VITAMIN D 25 HYDROXY (VIT D DEFICIENCY, FRACTURES): Vit D, 25-Hydroxy: 37 ng/mL (ref 30–100)

## 2015-05-02 LAB — VALPROIC ACID LEVEL: VALPROIC ACID LVL: 83.9 ug/mL (ref 50.0–100.0)

## 2015-06-23 ENCOUNTER — Other Ambulatory Visit: Payer: Self-pay | Admitting: Nurse Practitioner

## 2015-06-23 DIAGNOSIS — Z1231 Encounter for screening mammogram for malignant neoplasm of breast: Secondary | ICD-10-CM

## 2015-07-02 ENCOUNTER — Ambulatory Visit (INDEPENDENT_AMBULATORY_CARE_PROVIDER_SITE_OTHER): Payer: PRIVATE HEALTH INSURANCE | Admitting: Family Medicine

## 2015-07-02 ENCOUNTER — Encounter: Payer: Self-pay | Admitting: Family Medicine

## 2015-07-02 VITALS — BP 110/76 | Temp 98.4°F | Ht 62.0 in | Wt 118.5 lb

## 2015-07-02 DIAGNOSIS — J209 Acute bronchitis, unspecified: Secondary | ICD-10-CM | POA: Diagnosis not present

## 2015-07-02 DIAGNOSIS — J329 Chronic sinusitis, unspecified: Secondary | ICD-10-CM | POA: Diagnosis not present

## 2015-07-02 MED ORDER — AMOXICILLIN-POT CLAVULANATE 875-125 MG PO TABS: 1.0000 | ORAL_TABLET | Freq: Two times a day (BID) | ORAL | Status: AC

## 2015-07-02 NOTE — Progress Notes (Signed)
   Subjective:    Patient ID: Anne Logan, female    DOB: 12/15/1965, 50 y.o.   MRN: [id number]  Sinusitis This is a new problem. The current episode started in the past 7 days. The problem is unchanged. There has been no fever. The pain is moderate. Associated symptoms include congestion, coughing, ear pain, headaches and a sore throat. Past treatments include oral decongestants and spray decongestants. The treatment provided no relief.   Patient states that she has no other concerns at this time.  Alka seltz et cough and cpld ptec pllergy   Pos fe er  Both ears painful   Headache frontal in nature worse with change of position.  Intermittent cough productive nature yellowish phlegm low-grade fever present diminished energy diminished appetite  Review of Systems  HENT: Positive for congestion, ear pain and sore throat.   Respiratory: Positive for cough.   Neurological: Positive for headaches.       Objective:   Physical Exam  Alert, mild malaise. Hydration good Vitals stable. frontal/ maxillary tenderness evident positive nasal congestion. pharynx normal neck supple  lungs clear/no crackles or wheezes. heart regular in rhythm Plus bronchial cough during exam      Assessment & Plan:  Impression rhinosinusitis likely post viral, discussed with patient. plan antibiotics prescribed. Questions answered. Symptomatic care discussed. warning signs discussed. WSL Plus element of bronchitis patient strongly encouraged to stop smoking

## 2015-07-14 ENCOUNTER — Ambulatory Visit (HOSPITAL_COMMUNITY)
Admission: RE | Admit: 2015-07-14 | Discharge: 2015-07-14 | Disposition: A | Discharge status: Home / Self Care | Payer: PRIVATE HEALTH INSURANCE | Source: Ambulatory Visit | Attending: Nurse Practitioner | Admitting: Nurse Practitioner

## 2015-07-14 DIAGNOSIS — Z1231 Encounter for screening mammogram for malignant neoplasm of breast: Secondary | ICD-10-CM | POA: Diagnosis present

## 2015-07-19 ENCOUNTER — Other Ambulatory Visit (HOSPITAL_COMMUNITY): Payer: Self-pay | Admitting: Psychiatry

## 2015-07-21 ENCOUNTER — Telehealth (HOSPITAL_COMMUNITY): Payer: Self-pay | Admitting: *Deleted

## 2015-07-21 NOTE — Telephone Encounter (Signed)
Called pt to verify refills for her Depakote due to pharmacy requesting it and pt should not need refills until 07-24-15. lmtcb and office number provided

## 2015-07-24 ENCOUNTER — Ambulatory Visit (INDEPENDENT_AMBULATORY_CARE_PROVIDER_SITE_OTHER): Payer: PRIVATE HEALTH INSURANCE | Admitting: Psychiatry

## 2015-07-24 ENCOUNTER — Encounter (HOSPITAL_COMMUNITY): Payer: Self-pay | Admitting: Psychiatry

## 2015-07-24 VITALS — BP 128/81 | Ht 62.0 in | Wt 119.0 lb

## 2015-07-24 DIAGNOSIS — F3162 Bipolar disorder, current episode mixed, moderate: Secondary | ICD-10-CM

## 2015-07-24 MED ORDER — TRAZODONE HCL 50 MG PO TABS: 50.0000 mg | ORAL_TABLET | Freq: Every day | ORAL | Status: DC

## 2015-07-24 MED ORDER — QUETIAPINE FUMARATE 25 MG PO TABS: ORAL_TABLET | ORAL | Status: DC

## 2015-07-24 MED ORDER — DIVALPROEX SODIUM ER 500 MG PO TB24: ORAL_TABLET | ORAL | Status: DC

## 2015-07-24 MED ORDER — ALPRAZOLAM 0.5 MG PO TABS: 0.5000 mg | ORAL_TABLET | Freq: Three times a day (TID) | ORAL | Status: DC | PRN
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
## 2015-07-29 ENCOUNTER — Ambulatory Visit (HOSPITAL_COMMUNITY): Payer: Self-pay | Admitting: Psychiatry

## 2015-07-31 ENCOUNTER — Telehealth (HOSPITAL_COMMUNITY): Payer: Self-pay | Admitting: *Deleted

## 2015-07-31 NOTE — Telephone Encounter (Signed)
Return telephone call to pt. Pt states she is unable to the Trazodone.  She was experiencing some side effects, blurred vision, nausea and she was light headed. Offered pt an earlier appt, pt refused. Pt would like for Dr. Tenny Crawoss to call another medication into pharmacy. Informed pt provider is out of the office this week. Pt states she could wait to speak with Dr. Tenny Crawoss when she returns to office. Pt is schedule to f/u w/ provider on 10/5.

## 2015-07-31 NOTE — Telephone Encounter (Signed)
Voice message left by patient.  She said she is not able to take the Trazodone.   She is having too many side effects,  blurred vision, nausea, light headed.

## 2015-08-01 ENCOUNTER — Other Ambulatory Visit (HOSPITAL_COMMUNITY): Payer: Self-pay | Admitting: Psychiatry

## 2015-08-04 ENCOUNTER — Other Ambulatory Visit (HOSPITAL_COMMUNITY): Payer: Self-pay | Admitting: Psychiatry

## 2015-08-04 MED ORDER — ZOLPIDEM TARTRATE 5 MG PO TABS: 5.0000 mg | ORAL_TABLET | Freq: Every evening | ORAL | Status: DC | PRN

## 2015-08-04 NOTE — Telephone Encounter (Signed)
Ambien 5 mg printed

## 2015-08-05 NOTE — Telephone Encounter (Signed)
Pt verbalized understanding.

## 2015-08-05 NOTE — Telephone Encounter (Signed)
Called pt and informed her per previous call, she have printed Ambien at office ready for pick up

## 2015-08-06 ENCOUNTER — Encounter (HOSPITAL_COMMUNITY): Payer: Self-pay | Admitting: *Deleted

## 2015-08-06 NOTE — Progress Notes (Signed)
Pt came into office to pick up printed script. Pt D/L number is [id number] and expiration date of 08-19-2021. Script ID number is [id number]. Script is Ambien.

## 2015-08-29 ENCOUNTER — Ambulatory Visit (INDEPENDENT_AMBULATORY_CARE_PROVIDER_SITE_OTHER): Payer: PRIVATE HEALTH INSURANCE | Admitting: Nurse Practitioner

## 2015-08-29 ENCOUNTER — Encounter: Payer: Self-pay | Admitting: Nurse Practitioner

## 2015-08-29 VITALS — BP 112/78 | Temp 98.3°F | Ht 62.0 in | Wt 122.0 lb

## 2015-08-29 DIAGNOSIS — J069 Acute upper respiratory infection, unspecified: Secondary | ICD-10-CM

## 2015-08-29 DIAGNOSIS — B9689 Other specified bacterial agents as the cause of diseases classified elsewhere: Secondary | ICD-10-CM

## 2015-08-29 MED ORDER — AMOXICILLIN-POT CLAVULANATE 875-125 MG PO TABS: 1.0000 | ORAL_TABLET | Freq: Two times a day (BID) | ORAL | 0 refills | Status: DC

## 2015-08-29 MED ORDER — HYDROCODONE-HOMATROPINE 5-1.5 MG/5ML PO SYRP: 5.0000 mL | ORAL_SOLUTION | ORAL | 0 refills | Status: DC | PRN

## 2015-08-30 ENCOUNTER — Encounter: Payer: Self-pay | Admitting: Nurse Practitioner

## 2015-08-30 NOTE — Progress Notes (Signed)
Subjective:  Presents for c/o cough x 2 weeks. No fever. Sore throat with cough. No sinus headache. Runny nose. Increase cough at night, producing green sputum. No ear pain or wheezing. No vomiting, reflux or abdominal pain.   Objective:   BP 112/78   Temp 98.3 F (36.8 C) (Oral)   Ht 5\' 2"  (1.575 m)   Wt 122 lb (55.3 kg)   BMI 22.31 kg/m  NAD. Alert, oriented. TMs retracted, no erythema. Pharynx injected with PND noted. Neck supple with mild anterior adenopathy. Lungs clear. Heart RRR. Abdomen soft, non distended, non tender.   Assessment: Bacterial upper respiratory infection  Plan:  Meds ordered this encounter  Medications  . amoxicillin-clavulanate (AUGMENTIN) 875-125 MG tablet    Sig: Take 1 tablet by mouth 2 (two) times daily.    Dispense:  20 tablet    Refill:  0    Order Specific Question:   Supervising Provider    Answer:   Merlyn AlbertLUKING, WILLIAM S [[id number]]  . HYDROcodone-homatropine (HYCODAN) 5-1.5 MG/5ML syrup    Sig: Take 5 mLs by mouth every 4 (four) hours as needed.    Dispense:  90 mL    Refill:  0    Order Specific Question:   Supervising Provider    Answer:   Merlyn AlbertLUKING, WILLIAM S [[id number]]   Cannot use steroid nasal spray due to side effects. OTC meds as directed. Call back if worsens or persists.

## 2015-09-18 ENCOUNTER — Other Ambulatory Visit: Payer: Self-pay | Admitting: Physician Assistant

## 2015-10-23 ENCOUNTER — Encounter (HOSPITAL_COMMUNITY): Payer: Self-pay | Admitting: Psychiatry

## 2015-10-23 ENCOUNTER — Ambulatory Visit (INDEPENDENT_AMBULATORY_CARE_PROVIDER_SITE_OTHER): Payer: PRIVATE HEALTH INSURANCE | Admitting: Psychiatry

## 2015-10-23 VITALS — BP 114/77 | HR 93 | Ht 62.0 in | Wt 126.0 lb

## 2015-10-23 DIAGNOSIS — F39 Unspecified mood [affective] disorder: Secondary | ICD-10-CM | POA: Diagnosis not present

## 2015-10-23 DIAGNOSIS — F410 Panic disorder [episodic paroxysmal anxiety] without agoraphobia: Secondary | ICD-10-CM

## 2015-10-23 DIAGNOSIS — F3162 Bipolar disorder, current episode mixed, moderate: Secondary | ICD-10-CM

## 2015-10-23 MED ORDER — TEMAZEPAM 15 MG PO CAPS: 15.0000 mg | ORAL_CAPSULE | Freq: Every evening | ORAL | 2 refills | Status: DC | PRN

## 2015-10-23 MED ORDER — DIVALPROEX SODIUM ER 500 MG PO TB24: ORAL_TABLET | ORAL | 2 refills | Status: DC

## 2015-10-23 MED ORDER — QUETIAPINE FUMARATE 100 MG PO TABS: 100.0000 mg | ORAL_TABLET | Freq: Every day | ORAL | 2 refills | Status: DC

## 2015-10-23 MED ORDER — ALPRAZOLAM 0.5 MG PO TABS: 0.5000 mg | ORAL_TABLET | Freq: Three times a day (TID) | ORAL | 2 refills | Status: DC | PRN
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
## 2015-12-23 ENCOUNTER — Ambulatory Visit (INDEPENDENT_AMBULATORY_CARE_PROVIDER_SITE_OTHER): Payer: PRIVATE HEALTH INSURANCE | Admitting: Psychiatry

## 2015-12-23 ENCOUNTER — Encounter (HOSPITAL_COMMUNITY): Payer: Self-pay | Admitting: Psychiatry

## 2015-12-23 VITALS — BP 122/88 | HR 109 | Ht 62.0 in | Wt 130.2 lb

## 2015-12-23 DIAGNOSIS — Z813 Family history of other psychoactive substance abuse and dependence: Secondary | ICD-10-CM

## 2015-12-23 DIAGNOSIS — F3162 Bipolar disorder, current episode mixed, moderate: Secondary | ICD-10-CM

## 2015-12-23 DIAGNOSIS — Z811 Family history of alcohol abuse and dependence: Secondary | ICD-10-CM

## 2015-12-23 DIAGNOSIS — F410 Panic disorder [episodic paroxysmal anxiety] without agoraphobia: Secondary | ICD-10-CM | POA: Diagnosis not present

## 2015-12-23 DIAGNOSIS — Z818 Family history of other mental and behavioral disorders: Secondary | ICD-10-CM | POA: Diagnosis not present

## 2015-12-23 DIAGNOSIS — Z79899 Other long term (current) drug therapy: Secondary | ICD-10-CM

## 2015-12-23 MED ORDER — TEMAZEPAM 30 MG PO CAPS: 30.0000 mg | ORAL_CAPSULE | Freq: Every evening | ORAL | 2 refills | Status: DC | PRN

## 2015-12-23 MED ORDER — DIVALPROEX SODIUM ER 500 MG PO TB24: ORAL_TABLET | ORAL | 2 refills | Status: DC

## 2015-12-23 MED ORDER — ALPRAZOLAM 0.5 MG PO TABS: 0.5000 mg | ORAL_TABLET | Freq: Three times a day (TID) | ORAL | 2 refills | Status: DC | PRN

## 2015-12-23 MED ORDER — QUETIAPINE FUMARATE 100 MG PO TABS: 100.0000 mg | ORAL_TABLET | Freq: Every day | ORAL | 2 refills | Status: DC
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
## 2016-03-04 ENCOUNTER — Other Ambulatory Visit (HOSPITAL_COMMUNITY): Payer: Self-pay | Admitting: Psychiatry

## 2016-03-08 ENCOUNTER — Encounter: Payer: Self-pay | Admitting: Nurse Practitioner

## 2016-03-08 ENCOUNTER — Ambulatory Visit (INDEPENDENT_AMBULATORY_CARE_PROVIDER_SITE_OTHER): Payer: PRIVATE HEALTH INSURANCE | Admitting: Nurse Practitioner

## 2016-03-08 ENCOUNTER — Encounter: Payer: Self-pay | Admitting: Family Medicine

## 2016-03-08 VITALS — BP 112/76 | Temp 98.2°F | Ht 62.0 in | Wt 134.2 lb

## 2016-03-08 DIAGNOSIS — J209 Acute bronchitis, unspecified: Secondary | ICD-10-CM | POA: Diagnosis not present

## 2016-03-08 DIAGNOSIS — J111 Influenza due to unidentified influenza virus with other respiratory manifestations: Secondary | ICD-10-CM

## 2016-03-08 MED ORDER — AMOXICILLIN-POT CLAVULANATE 875-125 MG PO TABS: 1.0000 | ORAL_TABLET | Freq: Two times a day (BID) | ORAL | 0 refills | Status: DC

## 2016-03-08 MED ORDER — HYDROCODONE-HOMATROPINE 5-1.5 MG/5ML PO SYRP: 5.0000 mL | ORAL_SOLUTION | ORAL | 0 refills | Status: DC | PRN

## 2016-03-08 NOTE — Patient Instructions (Signed)

## 2016-03-08 NOTE — Progress Notes (Signed)
Subjective:  Presents for c/o fever, cough, body aches and fatigue that began 3 days ago. Facial area tight. Sore throat. Bilateral ear pain. No wheezing. No V/D or abd pain. Taking fluids well. Voiding nl.   Objective:   BP 112/76   Temp 98.2 F (36.8 C) (Oral)   Ht 5\' 2"  (1.575 m)   Wt 134 lb 4 oz (60.9 kg)   BMI 24.55 kg/m  NAD. Alert, oriented. TMs clear effusion; partially obscured right ear due to cerumen. Pharynx posterior erythema with green PND noted. Neck supple with mild anterior adenopathy; slightly tender. Lungs: scattered expiratory crackles; no wheezing or tachypnea. Heart RRR.   Assessment:  Influenza  Acute bronchitis, unspecified organism    Plan:   Meds ordered this encounter  Medications  . amoxicillin-clavulanate (AUGMENTIN) 875-125 MG tablet    Sig: Take 1 tablet by mouth 2 (two) times daily.    Dispense:  20 tablet    Refill:  0    Order Specific Question:   Supervising Provider    Answer:   Merlyn AlbertLUKING, WILLIAM S [[id number]]  . HYDROcodone-homatropine (HYCODAN) 5-1.5 MG/5ML syrup    Sig: Take 5 mLs by mouth every 4 (four) hours as needed.    Dispense:  120 mL    Refill:  0    Order Specific Question:   Supervising Provider    Answer:   Merlyn AlbertLUKING, WILLIAM S [[id number]]   Reviewed symptomatic care and warning signs. Tamiflu not prescribed since beyond the 48 hour window. Call back by the end of the week if no better, sooner if worse.

## 2016-03-22 ENCOUNTER — Encounter (HOSPITAL_COMMUNITY): Payer: Self-pay | Admitting: Psychiatry

## 2016-03-22 ENCOUNTER — Ambulatory Visit (INDEPENDENT_AMBULATORY_CARE_PROVIDER_SITE_OTHER): Payer: PRIVATE HEALTH INSURANCE | Admitting: Psychiatry

## 2016-03-22 VITALS — BP 118/78 | HR 104 | Ht 62.0 in | Wt 131.0 lb

## 2016-03-22 DIAGNOSIS — F3162 Bipolar disorder, current episode mixed, moderate: Secondary | ICD-10-CM | POA: Diagnosis not present

## 2016-03-22 DIAGNOSIS — Z818 Family history of other mental and behavioral disorders: Secondary | ICD-10-CM | POA: Diagnosis not present

## 2016-03-22 DIAGNOSIS — Z811 Family history of alcohol abuse and dependence: Secondary | ICD-10-CM | POA: Diagnosis not present

## 2016-03-22 DIAGNOSIS — F39 Unspecified mood [affective] disorder: Secondary | ICD-10-CM

## 2016-03-22 DIAGNOSIS — Z813 Family history of other psychoactive substance abuse and dependence: Secondary | ICD-10-CM | POA: Diagnosis not present

## 2016-03-22 MED ORDER — ALPRAZOLAM 0.5 MG PO TABS: 0.5000 mg | ORAL_TABLET | Freq: Three times a day (TID) | ORAL | 2 refills | Status: DC | PRN

## 2016-03-22 MED ORDER — QUETIAPINE FUMARATE 100 MG PO TABS: 100.0000 mg | ORAL_TABLET | Freq: Every day | ORAL | 2 refills | Status: DC

## 2016-03-22 MED ORDER — DIVALPROEX SODIUM ER 500 MG PO TB24: ORAL_TABLET | ORAL | 2 refills | Status: DC

## 2016-03-22 MED ORDER — TEMAZEPAM 30 MG PO CAPS: 30.0000 mg | ORAL_CAPSULE | Freq: Every evening | ORAL | 2 refills | Status: DC | PRN
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
## 2016-04-06 ENCOUNTER — Encounter: Payer: Self-pay | Admitting: Family Medicine

## 2016-04-06 ENCOUNTER — Ambulatory Visit (INDEPENDENT_AMBULATORY_CARE_PROVIDER_SITE_OTHER): Payer: PRIVATE HEALTH INSURANCE | Admitting: Family Medicine

## 2016-04-06 VITALS — BP 110/76 | Temp 98.1°F | Ht 62.0 in | Wt 128.2 lb

## 2016-04-06 DIAGNOSIS — J019 Acute sinusitis, unspecified: Secondary | ICD-10-CM | POA: Diagnosis not present

## 2016-04-06 MED ORDER — AMOXICILLIN-POT CLAVULANATE 875-125 MG PO TABS: 1.0000 | ORAL_TABLET | Freq: Two times a day (BID) | ORAL | 0 refills | Status: DC

## 2016-04-06 NOTE — Progress Notes (Deleted)
Error

## 2016-04-06 NOTE — Progress Notes (Signed)
   Subjective:    Patient ID: Anne Logan, female    DOB: 07/01/1965, 51 y.o.   MRN: [id number]  Otalgia   There is pain in both ears. This is a new problem. Associated symptoms include coughing, rhinorrhea and a sore throat.   States no other concerns this visit.   Review of Systems  Constitutional: Negative for activity change and fever.  HENT: Positive for congestion, ear pain, rhinorrhea and sore throat.   Eyes: Negative for discharge.  Respiratory: Positive for cough. Negative for shortness of breath and wheezing.   Cardiovascular: Negative for chest pain.       Objective:   Physical Exam  Constitutional: She appears well-developed.  HENT:  Head: Normocephalic.  Nose: Nose normal.  Mouth/Throat: Oropharynx is clear and moist. No oropharyngeal exudate.  Neck: Neck supple.  Cardiovascular: Normal rate and normal heart sounds.   No murmur heard. Pulmonary/Chest: Effort normal and breath sounds normal. She has no wheezes.  Lymphadenopathy:    She has no cervical adenopathy.  Skin: Skin is warm and dry.  Nursing note and vitals reviewed.         Assessment & Plan:  Viral syndrome Secondary rhinosinusitis Antibiotic prescribed Augmentin twice a day 10 days If progressive troubles or worse to follow-up

## 2016-04-06 NOTE — Progress Notes (Addendum)
   Subjective:    Patient ID: Anne OctaveWanda D Logan, female    DOB: 10/09/1965, 51 y.o.   MRN: [id number]  Otalgia   There is pain in both ears. Associated symptoms include coughing, rhinorrhea and a sore throat.      Review of Systems  HENT: Positive for ear pain, rhinorrhea and sore throat.   Respiratory: Positive for cough.        Objective:   Physical Exam        Assessment & Plan:

## 2016-04-19 ENCOUNTER — Telehealth (HOSPITAL_COMMUNITY): Payer: Self-pay

## 2016-04-19 ENCOUNTER — Other Ambulatory Visit (HOSPITAL_COMMUNITY): Payer: Self-pay | Admitting: Psychiatry

## 2016-04-19 NOTE — Telephone Encounter (Signed)
Called Mr. Anne Logan, pharmacist at [facility] to provide order from 03/22/16 for Restoril by Dr. Tenny Craw.  Mr. Anne Logan reported he had found the prescription there as it was filled with patient's Xanax order from the same date.  Called patient back to inform order was found by pharmacist at [facility] and being filled at this time.

## 2016-04-19 NOTE — Telephone Encounter (Signed)
You may call in 30 days with 2 refills 

## 2016-04-19 NOTE — Telephone Encounter (Signed)
Medication management - Patient called and left a message she had lost her prescription for Restoril, written at evaluation 03/22/16. Called pt. back to verify as states she has the Xanax prescription from that day but not the Restoril and cannot find it.  Patient reported the Restoril prescription was not on the same paper prescription with her Xanax order and not with the other Xanax order.  States she still has the Xanax order but needs a new Restoril order from Dr. Tenny Craw to replace the misplaced Xanax order.  Agreed to send request to Dr. Tenny Craw as she states she is out today and needs the order as soon as possible if approved.

## 2016-04-20 ENCOUNTER — Telehealth (HOSPITAL_COMMUNITY): Payer: Self-pay | Admitting: *Deleted

## 2016-04-20 NOTE — Telephone Encounter (Signed)
noted 

## 2016-04-20 NOTE — Telephone Encounter (Signed)
voice message from patient, said to disregard the message she left earlier.   She said her husband looked at the prescription wrong,  two was on the script.

## 2016-06-04 ENCOUNTER — Emergency Department (HOSPITAL_COMMUNITY): Admit: 2016-06-04 | Payer: PRIVATE HEALTH INSURANCE | Admitting: Cardiovascular Disease

## 2016-06-04 ENCOUNTER — Encounter (HOSPITAL_COMMUNITY): Payer: Self-pay

## 2016-06-04 ENCOUNTER — Other Ambulatory Visit: Payer: Self-pay | Admitting: Nurse Practitioner

## 2016-06-04 DIAGNOSIS — Z1231 Encounter for screening mammogram for malignant neoplasm of breast: Secondary | ICD-10-CM

## 2016-06-04 SURGERY — LEFT HEART CATH AND CORONARY ANGIOGRAPHY
Anesthesia: LOCAL

## 2016-06-21 ENCOUNTER — Encounter (HOSPITAL_COMMUNITY): Payer: Self-pay | Admitting: Psychiatry

## 2016-06-21 ENCOUNTER — Ambulatory Visit (INDEPENDENT_AMBULATORY_CARE_PROVIDER_SITE_OTHER): Payer: PRIVATE HEALTH INSURANCE | Admitting: Psychiatry

## 2016-06-21 VITALS — BP 127/93 | HR 101 | Ht 60.0 in | Wt 116.0 lb

## 2016-06-21 DIAGNOSIS — F39 Unspecified mood [affective] disorder: Secondary | ICD-10-CM | POA: Diagnosis not present

## 2016-06-21 DIAGNOSIS — F410 Panic disorder [episodic paroxysmal anxiety] without agoraphobia: Secondary | ICD-10-CM | POA: Diagnosis not present

## 2016-06-21 DIAGNOSIS — Z811 Family history of alcohol abuse and dependence: Secondary | ICD-10-CM | POA: Diagnosis not present

## 2016-06-21 DIAGNOSIS — Z813 Family history of other psychoactive substance abuse and dependence: Secondary | ICD-10-CM

## 2016-06-21 DIAGNOSIS — Z818 Family history of other mental and behavioral disorders: Secondary | ICD-10-CM

## 2016-06-21 DIAGNOSIS — F3162 Bipolar disorder, current episode mixed, moderate: Secondary | ICD-10-CM | POA: Diagnosis not present

## 2016-06-21 MED ORDER — DIVALPROEX SODIUM ER 500 MG PO TB24: ORAL_TABLET | ORAL | 2 refills | Status: DC

## 2016-06-21 MED ORDER — ALPRAZOLAM 0.5 MG PO TABS: 0.5000 mg | ORAL_TABLET | Freq: Three times a day (TID) | ORAL | 3 refills | Status: DC | PRN

## 2016-06-21 MED ORDER — QUETIAPINE FUMARATE 100 MG PO TABS: 100.0000 mg | ORAL_TABLET | Freq: Every day | ORAL | 3 refills | Status: DC

## 2016-06-21 MED ORDER — TEMAZEPAM 30 MG PO CAPS: 30.0000 mg | ORAL_CAPSULE | Freq: Every evening | ORAL | 2 refills | Status: DC | PRN

## 2016-06-21 MED ORDER — QUETIAPINE FUMARATE 100 MG PO TABS: 100.0000 mg | ORAL_TABLET | Freq: Every day | ORAL | 2 refills | Status: DC

## 2016-06-21 MED ORDER — ALPRAZOLAM 0.5 MG PO TABS: 0.5000 mg | ORAL_TABLET | Freq: Three times a day (TID) | ORAL | 2 refills | Status: DC | PRN

## 2016-06-21 MED ORDER — TEMAZEPAM 30 MG PO CAPS: 30.0000 mg | ORAL_CAPSULE | Freq: Every evening | ORAL | 3 refills | Status: DC | PRN
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
## 2016-07-19 ENCOUNTER — Ambulatory Visit (HOSPITAL_COMMUNITY): Payer: Self-pay

## 2016-07-26 ENCOUNTER — Ambulatory Visit (HOSPITAL_COMMUNITY)
Admission: RE | Admit: 2016-07-26 | Discharge: 2016-07-26 | Disposition: A | Discharge status: Home / Self Care | Payer: PRIVATE HEALTH INSURANCE | Source: Ambulatory Visit | Attending: Nurse Practitioner | Admitting: Nurse Practitioner

## 2016-07-26 ENCOUNTER — Encounter (HOSPITAL_COMMUNITY): Payer: Self-pay | Admitting: Radiology

## 2016-07-26 DIAGNOSIS — N6489 Other specified disorders of breast: Secondary | ICD-10-CM | POA: Diagnosis not present

## 2016-07-26 DIAGNOSIS — R928 Other abnormal and inconclusive findings on diagnostic imaging of breast: Secondary | ICD-10-CM | POA: Diagnosis not present

## 2016-07-26 DIAGNOSIS — Z1231 Encounter for screening mammogram for malignant neoplasm of breast: Secondary | ICD-10-CM | POA: Insufficient documentation

## 2016-07-27 ENCOUNTER — Other Ambulatory Visit: Payer: Self-pay | Admitting: Nurse Practitioner

## 2016-07-27 DIAGNOSIS — R928 Other abnormal and inconclusive findings on diagnostic imaging of breast: Secondary | ICD-10-CM

## 2016-08-03 ENCOUNTER — Ambulatory Visit (HOSPITAL_COMMUNITY)
Admission: RE | Admit: 2016-08-03 | Discharge: 2016-08-03 | Disposition: A | Discharge status: Home / Self Care | Payer: PRIVATE HEALTH INSURANCE | Source: Ambulatory Visit | Attending: Nurse Practitioner | Admitting: Nurse Practitioner

## 2016-08-03 DIAGNOSIS — R928 Other abnormal and inconclusive findings on diagnostic imaging of breast: Secondary | ICD-10-CM | POA: Diagnosis present

## 2016-10-14 ENCOUNTER — Other Ambulatory Visit (HOSPITAL_COMMUNITY): Payer: Self-pay | Admitting: Psychiatry

## 2016-10-18 ENCOUNTER — Ambulatory Visit (HOSPITAL_COMMUNITY): Payer: PRIVATE HEALTH INSURANCE | Admitting: Psychiatry

## 2016-10-26 ENCOUNTER — Ambulatory Visit (HOSPITAL_COMMUNITY): Payer: PRIVATE HEALTH INSURANCE | Admitting: Psychiatry

## 2016-10-26 ENCOUNTER — Ambulatory Visit (HOSPITAL_COMMUNITY): Payer: Self-pay | Admitting: Psychiatry

## 2016-11-01 ENCOUNTER — Ambulatory Visit (INDEPENDENT_AMBULATORY_CARE_PROVIDER_SITE_OTHER): Payer: PRIVATE HEALTH INSURANCE | Admitting: Psychiatry

## 2016-11-01 ENCOUNTER — Encounter (HOSPITAL_COMMUNITY): Payer: Self-pay | Admitting: Psychiatry

## 2016-11-01 VITALS — BP 122/83 | HR 100 | Ht 60.0 in | Wt 122.8 lb

## 2016-11-01 DIAGNOSIS — F3162 Bipolar disorder, current episode mixed, moderate: Secondary | ICD-10-CM

## 2016-11-01 DIAGNOSIS — Z818 Family history of other mental and behavioral disorders: Secondary | ICD-10-CM

## 2016-11-01 DIAGNOSIS — Z813 Family history of other psychoactive substance abuse and dependence: Secondary | ICD-10-CM

## 2016-11-01 DIAGNOSIS — F410 Panic disorder [episodic paroxysmal anxiety] without agoraphobia: Secondary | ICD-10-CM | POA: Diagnosis not present

## 2016-11-01 DIAGNOSIS — R450 Nervousness: Secondary | ICD-10-CM

## 2016-11-01 DIAGNOSIS — F39 Unspecified mood [affective] disorder: Secondary | ICD-10-CM | POA: Diagnosis not present

## 2016-11-01 DIAGNOSIS — F419 Anxiety disorder, unspecified: Secondary | ICD-10-CM | POA: Diagnosis not present

## 2016-11-01 DIAGNOSIS — Z811 Family history of alcohol abuse and dependence: Secondary | ICD-10-CM

## 2016-11-01 MED ORDER — DIVALPROEX SODIUM ER 500 MG PO TB24: ORAL_TABLET | ORAL | 2 refills | Status: DC

## 2016-11-01 MED ORDER — QUETIAPINE FUMARATE 100 MG PO TABS: 100.0000 mg | ORAL_TABLET | Freq: Every day | ORAL | 3 refills | Status: DC

## 2016-11-01 MED ORDER — ALPRAZOLAM 0.5 MG PO TABS: 0.5000 mg | ORAL_TABLET | Freq: Three times a day (TID) | ORAL | 3 refills | Status: DC | PRN

## 2016-11-01 MED ORDER — TEMAZEPAM 30 MG PO CAPS: 30.0000 mg | ORAL_CAPSULE | Freq: Every evening | ORAL | 3 refills | Status: DC | PRN
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
## 2017-01-03 ENCOUNTER — Other Ambulatory Visit: Payer: PRIVATE HEALTH INSURANCE | Admitting: Adult Health

## 2017-01-06 ENCOUNTER — Other Ambulatory Visit: Payer: PRIVATE HEALTH INSURANCE | Admitting: Adult Health

## 2017-01-27 ENCOUNTER — Encounter: Payer: Self-pay | Admitting: Family Medicine

## 2017-01-27 ENCOUNTER — Ambulatory Visit (INDEPENDENT_AMBULATORY_CARE_PROVIDER_SITE_OTHER): Payer: PRIVATE HEALTH INSURANCE | Admitting: Family Medicine

## 2017-01-27 VITALS — BP 116/72 | Temp 98.4°F | Ht 62.0 in | Wt 134.4 lb

## 2017-01-27 DIAGNOSIS — J019 Acute sinusitis, unspecified: Secondary | ICD-10-CM

## 2017-01-27 MED ORDER — CEFDINIR 300 MG PO CAPS: 300.0000 mg | ORAL_CAPSULE | Freq: Two times a day (BID) | ORAL | 0 refills | Status: DC

## 2017-01-27 NOTE — Progress Notes (Signed)
   Subjective:    Patient ID: Anne Logan, female    DOB: 08/01/1965, 52 y.o.   MRN: [id number]  Sinus Problem  This is a new problem. The current episode started in the past 7 days. Associated symptoms include congestion, coughing, ear pain, headaches and a sore throat. Treatments tried: otc cold meds.   Last thur started nose riunning  Ears hurting   Felt bad  Used a z pk,    Did not help   Now has moved into the [facility]  Doe n nt take otc meds uses tyl prn   Has cut down smoking        Review of Systems  HENT: Positive for congestion, ear pain and sore throat.   Respiratory: Positive for cough.   Neurological: Positive for headaches.       Objective:   Physical Exam  Alert, mild malaise. Hydration good Vitals stable. frontal/ maxillary tenderness evident positive nasal congestion. pharynx normal neck supple  lungs clear/no crackles or wheezes. heart regular in rhythm       Assessment & Plan:  Impression rhinosinusitis/bronchitis likely post viral, discussed with patient. plan antibiotics prescribed. Questions answered. Symptomatic care discussed. warning signs discussed. WSL

## 2017-01-31 ENCOUNTER — Encounter: Payer: Self-pay | Admitting: Adult Health

## 2017-01-31 ENCOUNTER — Other Ambulatory Visit (HOSPITAL_COMMUNITY)
Admission: RE | Admit: 2017-01-31 | Discharge: 2017-01-31 | Disposition: A | Discharge status: Home / Self Care | Payer: PRIVATE HEALTH INSURANCE | Source: Ambulatory Visit | Attending: Adult Health | Admitting: Adult Health

## 2017-01-31 ENCOUNTER — Ambulatory Visit (INDEPENDENT_AMBULATORY_CARE_PROVIDER_SITE_OTHER): Payer: PRIVATE HEALTH INSURANCE | Admitting: Adult Health

## 2017-01-31 VITALS — BP 116/80 | HR 102 | Ht 63.0 in | Wt 133.0 lb

## 2017-01-31 DIAGNOSIS — R319 Hematuria, unspecified: Secondary | ICD-10-CM | POA: Insufficient documentation

## 2017-01-31 DIAGNOSIS — R350 Frequency of micturition: Secondary | ICD-10-CM | POA: Diagnosis not present

## 2017-01-31 DIAGNOSIS — L918 Other hypertrophic disorders of the skin: Secondary | ICD-10-CM | POA: Insufficient documentation

## 2017-01-31 DIAGNOSIS — N3946 Mixed incontinence: Secondary | ICD-10-CM

## 2017-01-31 DIAGNOSIS — Z01411 Encounter for gynecological examination (general) (routine) with abnormal findings: Secondary | ICD-10-CM | POA: Diagnosis not present

## 2017-01-31 DIAGNOSIS — Z1211 Encounter for screening for malignant neoplasm of colon: Secondary | ICD-10-CM | POA: Diagnosis not present

## 2017-01-31 DIAGNOSIS — Z01419 Encounter for gynecological examination (general) (routine) without abnormal findings: Secondary | ICD-10-CM

## 2017-01-31 DIAGNOSIS — Z1212 Encounter for screening for malignant neoplasm of rectum: Secondary | ICD-10-CM | POA: Diagnosis not present

## 2017-01-31 LAB — POCT URINALYSIS DIPSTICK
Glucose, UA: NEGATIVE
Leukocytes, UA: NEGATIVE
NITRITE UA: NEGATIVE
PROTEIN UA: NEGATIVE

## 2017-01-31 LAB — HEMOCCULT GUIAC POC 1CARD (OFFICE): Fecal Occult Blood, POC: NEGATIVE

## 2017-01-31 MED ORDER — SOLIFENACIN SUCCINATE 10 MG PO TABS: 10.0000 mg | ORAL_TABLET | Freq: Every day | ORAL | 3 refills | Status: DC

## 2017-01-31 NOTE — Progress Notes (Signed)
Patient ID: Anne Logan, female   DOB: 09/02/1965, 52 y.o.   MRN: [id number] History of Present Illness: Anne Logan is a 52 year old white female, married in for well woman gyn exam and pap.She complains of urinary frequency and some UI.Has had some back pain today.  PCP is Anne Logan.   Current Medications, Allergies, Past Medical History, Past Surgical History, Family History and Social History were reviewed in [facility] electronic medical record.     Review of Systems: Patient denies any headaches, hearing loss, fatigue, blurred vision, shortness of breath, chest pain, abdominal pain, problems with bowel movements, or intercourse(not having sex, husband not well). No joint pain or mood swings. See HPI for positives.   Physical Exam:BP 116/80 (BP Location: Left Arm, Patient Position: Sitting, Cuff Size: Normal)   Pulse (!) 102   Ht 5\' 3"  (1.6 m)   Wt 133 lb (60.3 kg)   LMP 04/13/2015   BMI 23.56 kg/m urine dipstick trace blood, trace ketones. General:  Well developed, well nourished, no acute distress Skin:  Warm and dry Neck:  Midline trachea, normal thyroid, good ROM, no lymphadenopathy Lungs; Clear to auscultation bilaterally Breast:  No dominant palpable mass, retraction, or nipple discharge Cardiovascular: Regular rate and rhythm Abdomen:  Soft, non tender, no hepatosplenomegaly Pelvic:  External genitalia is normal in appearance, several skin tags base left labia.  The vagina is normal in appearance. Urethra has no lesions or masses. The cervix is bulbous,pap with HPV performed.  Uterus is felt to be normal size, shape, and contour.  No adnexal masses or tenderness noted.Bladder is non tender, no masses felt. Rectal: Good sphincter tone, no polyps, or hemorrhoids felt.  Hemoccult negative. Extremities/musculoskeletal:  No swelling or varicosities noted, no clubbing or cyanosis Psych:  No mood changes, alert and cooperative,seems happy PHQ 2 score o.  Impression: 1.  Encounter for gynecological examination with Papanicolaou smear of cervix   2. Urinary frequency   3. Screening for colorectal cancer   4. Skin tags, multiple acquired   5. Mixed stress and urge urinary incontinence   6. Hematuria, unspecified type       Plan: UA C&S sent Meds ordered this encounter  Medications  . solifenacin (VESICARE) 10 MG tablet    Sig: Take 1 tablet (10 mg total) by mouth daily.    Dispense:  30 tablet    Refill:  3    Order Specific Question:   Supervising Provider    Answer:   Anne Logan [[id number]]  Follow up in 4 week for skin tag removal and ROS Physical in 1 year Pap in 3 if normal Mammogram yearly Colonoscopy advised

## 2017-02-02 ENCOUNTER — Telehealth: Payer: Self-pay | Admitting: Adult Health

## 2017-02-02 LAB — URINALYSIS, ROUTINE W REFLEX MICROSCOPIC
BILIRUBIN UA: NEGATIVE
Glucose, UA: NEGATIVE
Ketones, UA: NEGATIVE
LEUKOCYTES UA: NEGATIVE
NITRITE UA: NEGATIVE
PH UA: 7.5 (ref 5.0–7.5)
Protein, UA: NEGATIVE
RBC UA: NEGATIVE
Specific Gravity, UA: 1.024 (ref 1.005–1.030)
Urobilinogen, Ur: 0.2 mg/dL (ref 0.2–1.0)

## 2017-02-02 LAB — CYTOLOGY - PAP
Diagnosis: NEGATIVE
HPV: NOT DETECTED

## 2017-02-02 MED ORDER — FLUCONAZOLE 150 MG PO TABS: 150.0000 mg | ORAL_TABLET | Freq: Once | ORAL | 0 refills | Status: AC

## 2017-02-02 NOTE — Telephone Encounter (Signed)
Pt aware that pap negative for malignancy and HPV, but +yeast will rx diflucan

## 2017-02-03 LAB — URINE CULTURE: ORGANISM ID, BACTERIA: NO GROWTH

## 2017-02-08 ENCOUNTER — Telehealth: Payer: Self-pay | Admitting: Family Medicine

## 2017-02-08 MED ORDER — AMOXICILLIN-POT CLAVULANATE 875-125 MG PO TABS
1.0000 | ORAL_TABLET | Freq: Two times a day (BID) | ORAL | 0 refills | Status: DC
Start: 2017-02-08 — End: 2017-03-16

## 2017-02-08 NOTE — Telephone Encounter (Signed)
Patient was seen 1/10 with sinus infection and given omnicef 300 mg. She finished up antibiotic on 1/19 but still has cough and now cold has settled in her chest. [facility]

## 2017-02-08 NOTE — Telephone Encounter (Signed)
Rx sent [facility] to [facility] and pt is aware new one sent in to d/c the previous.

## 2017-02-08 NOTE — Telephone Encounter (Signed)
Aug 875 bid ten d 

## 2017-02-08 NOTE — Telephone Encounter (Signed)
I spoke with the pt she states she finished the antibx on Sat. She says she still has a productive cough that produces green mucus. She is not taking anything over the counter. Please advise. If anything is needed to be called in to the drugstore she uses [facility].

## 2017-02-08 NOTE — Telephone Encounter (Signed)
Please see below.

## 2017-02-08 NOTE — Addendum Note (Signed)
Addended by: Meredith LeedsSUTTON, Venba Zenner L on: 02/08/2017 11:05 AM   Modules accepted: Orders

## 2017-02-22 ENCOUNTER — Telehealth: Payer: Self-pay | Admitting: Obstetrics & Gynecology

## 2017-02-22 NOTE — Telephone Encounter (Signed)
Informed patient urine showed no growth. No further questions or problems.

## 2017-03-03 ENCOUNTER — Ambulatory Visit (HOSPITAL_COMMUNITY): Payer: Self-pay | Admitting: Psychiatry

## 2017-03-10 ENCOUNTER — Ambulatory Visit (HOSPITAL_COMMUNITY): Payer: PRIVATE HEALTH INSURANCE | Admitting: Psychiatry

## 2017-03-12 ENCOUNTER — Other Ambulatory Visit (HOSPITAL_COMMUNITY): Payer: Self-pay | Admitting: Psychiatry

## 2017-03-14 ENCOUNTER — Other Ambulatory Visit (HOSPITAL_COMMUNITY): Payer: Self-pay | Admitting: Psychiatry

## 2017-03-14 ENCOUNTER — Telehealth (HOSPITAL_COMMUNITY): Payer: Self-pay | Admitting: *Deleted

## 2017-03-14 MED ORDER — TEMAZEPAM 30 MG PO CAPS: 30.0000 mg | ORAL_CAPSULE | Freq: Every day | ORAL | 2 refills | Status: DC

## 2017-03-14 NOTE — Telephone Encounter (Signed)
sent 

## 2017-03-14 NOTE — Telephone Encounter (Signed)
Dr Tenny Crawoss Patient called stating she has had to reschedule appointment  X 2 due to provider schedule. Patient is requesting refill on Temazepam has enough for 1 more day & next appointment is 03/16/17.

## 2017-03-16 ENCOUNTER — Ambulatory Visit (INDEPENDENT_AMBULATORY_CARE_PROVIDER_SITE_OTHER): Payer: PRIVATE HEALTH INSURANCE | Admitting: Psychiatry

## 2017-03-16 ENCOUNTER — Telehealth: Payer: Self-pay | Admitting: *Deleted

## 2017-03-16 ENCOUNTER — Encounter (HOSPITAL_COMMUNITY): Payer: Self-pay | Admitting: Psychiatry

## 2017-03-16 VITALS — BP 134/86 | HR 104 | Ht 63.0 in | Wt 132.0 lb

## 2017-03-16 DIAGNOSIS — Z818 Family history of other mental and behavioral disorders: Secondary | ICD-10-CM | POA: Diagnosis not present

## 2017-03-16 DIAGNOSIS — Z62810 Personal history of physical and sexual abuse in childhood: Secondary | ICD-10-CM

## 2017-03-16 DIAGNOSIS — Z813 Family history of other psychoactive substance abuse and dependence: Secondary | ICD-10-CM | POA: Diagnosis not present

## 2017-03-16 DIAGNOSIS — F17210 Nicotine dependence, cigarettes, uncomplicated: Secondary | ICD-10-CM | POA: Diagnosis not present

## 2017-03-16 DIAGNOSIS — F3162 Bipolar disorder, current episode mixed, moderate: Secondary | ICD-10-CM | POA: Diagnosis not present

## 2017-03-16 DIAGNOSIS — Z636 Dependent relative needing care at home: Secondary | ICD-10-CM | POA: Diagnosis not present

## 2017-03-16 DIAGNOSIS — G4700 Insomnia, unspecified: Secondary | ICD-10-CM

## 2017-03-16 DIAGNOSIS — Z811 Family history of alcohol abuse and dependence: Secondary | ICD-10-CM | POA: Diagnosis not present

## 2017-03-16 DIAGNOSIS — Z62811 Personal history of psychological abuse in childhood: Secondary | ICD-10-CM | POA: Diagnosis not present

## 2017-03-16 MED ORDER — TOLTERODINE TARTRATE ER 2 MG PO CP24: 2.0000 mg | ORAL_CAPSULE | Freq: Every day | ORAL | 2 refills | Status: DC

## 2017-03-16 MED ORDER — DIVALPROEX SODIUM ER 500 MG PO TB24: ORAL_TABLET | ORAL | 2 refills | Status: DC

## 2017-03-16 MED ORDER — QUETIAPINE FUMARATE 100 MG PO TABS: 100.0000 mg | ORAL_TABLET | Freq: Every day | ORAL | 3 refills | Status: DC

## 2017-03-16 MED ORDER — ALPRAZOLAM 0.5 MG PO TABS: 0.5000 mg | ORAL_TABLET | Freq: Three times a day (TID) | ORAL | 3 refills | Status: DC | PRN
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
## 2017-03-16 NOTE — Telephone Encounter (Signed)
Pt complains of blurred vision with Vesicare, so stop it and lets try Detrol LA 2 mg daily

## 2017-03-17 ENCOUNTER — Telehealth (HOSPITAL_COMMUNITY): Payer: Self-pay | Admitting: *Deleted

## 2017-03-17 ENCOUNTER — Other Ambulatory Visit (HOSPITAL_COMMUNITY): Payer: Self-pay | Admitting: Psychiatry

## 2017-03-17 MED ORDER — TEMAZEPAM 30 MG PO CAPS: 30.0000 mg | ORAL_CAPSULE | Freq: Every day | ORAL | 2 refills | Status: DC

## 2017-03-17 NOTE — Telephone Encounter (Signed)
Dr Tenny Crawoss Patient called stating that she took the sample of Belsomra last night & that she has been up all night.  Stated that she couldn't sleep @ all & now she's @ work from 8-5.

## 2017-03-17 NOTE — Telephone Encounter (Signed)
I sent Restoril back into the pharmacy since she got some sleep with it. She can add 10 mg of melatonin if needed

## 2017-05-14 ENCOUNTER — Other Ambulatory Visit (HOSPITAL_COMMUNITY): Payer: Self-pay | Admitting: Psychiatry

## 2017-06-13 ENCOUNTER — Other Ambulatory Visit (HOSPITAL_COMMUNITY): Payer: Self-pay | Admitting: Psychiatry

## 2017-06-15 ENCOUNTER — Encounter (HOSPITAL_COMMUNITY): Payer: Self-pay | Admitting: Psychiatry

## 2017-06-15 ENCOUNTER — Ambulatory Visit (INDEPENDENT_AMBULATORY_CARE_PROVIDER_SITE_OTHER): Payer: PRIVATE HEALTH INSURANCE | Admitting: Psychiatry

## 2017-06-15 ENCOUNTER — Other Ambulatory Visit: Payer: Self-pay | Admitting: Adult Health

## 2017-06-15 VITALS — BP 116/84 | HR 96 | Ht 63.0 in | Wt 131.0 lb

## 2017-06-15 DIAGNOSIS — Z818 Family history of other mental and behavioral disorders: Secondary | ICD-10-CM | POA: Diagnosis not present

## 2017-06-15 DIAGNOSIS — F3162 Bipolar disorder, current episode mixed, moderate: Secondary | ICD-10-CM | POA: Diagnosis not present

## 2017-06-15 DIAGNOSIS — Z91410 Personal history of adult physical and sexual abuse: Secondary | ICD-10-CM | POA: Diagnosis not present

## 2017-06-15 DIAGNOSIS — F17210 Nicotine dependence, cigarettes, uncomplicated: Secondary | ICD-10-CM

## 2017-06-15 DIAGNOSIS — Z811 Family history of alcohol abuse and dependence: Secondary | ICD-10-CM | POA: Diagnosis not present

## 2017-06-15 DIAGNOSIS — G4700 Insomnia, unspecified: Secondary | ICD-10-CM

## 2017-06-15 DIAGNOSIS — Z91411 Personal history of adult psychological abuse: Secondary | ICD-10-CM | POA: Diagnosis not present

## 2017-06-15 DIAGNOSIS — Z813 Family history of other psychoactive substance abuse and dependence: Secondary | ICD-10-CM

## 2017-06-15 MED ORDER — TEMAZEPAM 30 MG PO CAPS: ORAL_CAPSULE | ORAL | 2 refills | Status: DC

## 2017-06-15 MED ORDER — QUETIAPINE FUMARATE 100 MG PO TABS: 100.0000 mg | ORAL_TABLET | Freq: Every day | ORAL | 3 refills | Status: DC

## 2017-06-15 MED ORDER — TOLTERODINE TARTRATE ER 2 MG PO CP24: 2.0000 mg | ORAL_CAPSULE | Freq: Every day | ORAL | 2 refills | Status: DC

## 2017-06-15 MED ORDER — ALPRAZOLAM 0.5 MG PO TABS: 0.5000 mg | ORAL_TABLET | Freq: Three times a day (TID) | ORAL | 3 refills | Status: DC | PRN

## 2017-06-15 MED ORDER — DIVALPROEX SODIUM ER 500 MG PO TB24: ORAL_TABLET | ORAL | 2 refills | Status: DC

## 2017-06-15 NOTE — Progress Notes (Signed)
Refill detrol  
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
## 2017-06-16 LAB — HEPATIC FUNCTION PANEL
AG Ratio: 2 (calc) (ref 1.0–2.5)
ALBUMIN MSPROF: 4.5 g/dL (ref 3.6–5.1)
ALT: 16 U/L (ref 6–29)
AST: 24 U/L (ref 10–35)
Alkaline phosphatase (APISO): 71 U/L (ref 33–130)
BILIRUBIN DIRECT: 0 mg/dL (ref 0.0–0.2)
GLOBULIN: 2.2 g/dL (ref 1.9–3.7)
Indirect Bilirubin: 0.2 mg/dL (calc) (ref 0.2–1.2)
Total Bilirubin: 0.2 mg/dL (ref 0.2–1.2)
Total Protein: 6.7 g/dL (ref 6.1–8.1)

## 2017-06-16 LAB — VALPROIC ACID LEVEL: Valproic Acid Lvl: 63.9 mg/L (ref 50.0–100.0)

## 2017-06-27 ENCOUNTER — Other Ambulatory Visit: Payer: Self-pay | Admitting: Family Medicine

## 2017-06-27 DIAGNOSIS — Z1231 Encounter for screening mammogram for malignant neoplasm of breast: Secondary | ICD-10-CM

## 2017-08-08 ENCOUNTER — Ambulatory Visit (HOSPITAL_COMMUNITY)
Admission: RE | Admit: 2017-08-08 | Discharge: 2017-08-08 | Disposition: A | Discharge status: Home / Self Care | Payer: PRIVATE HEALTH INSURANCE | Source: Ambulatory Visit | Attending: Family Medicine | Admitting: Family Medicine

## 2017-08-08 DIAGNOSIS — Z1231 Encounter for screening mammogram for malignant neoplasm of breast: Secondary | ICD-10-CM | POA: Insufficient documentation

## 2017-08-26 ENCOUNTER — Other Ambulatory Visit: Payer: Self-pay | Admitting: Adult Health

## 2017-09-07 ENCOUNTER — Other Ambulatory Visit (HOSPITAL_COMMUNITY): Payer: Self-pay | Admitting: Psychiatry

## 2017-10-17 ENCOUNTER — Encounter (HOSPITAL_COMMUNITY): Payer: Self-pay | Admitting: Psychiatry

## 2017-10-17 ENCOUNTER — Ambulatory Visit (INDEPENDENT_AMBULATORY_CARE_PROVIDER_SITE_OTHER): Payer: PRIVATE HEALTH INSURANCE | Admitting: Psychiatry

## 2017-10-17 ENCOUNTER — Other Ambulatory Visit (HOSPITAL_COMMUNITY): Payer: Self-pay | Admitting: Psychiatry

## 2017-10-17 VITALS — BP 156/90 | HR 101 | Ht 63.0 in | Wt 131.0 lb

## 2017-10-17 DIAGNOSIS — F17210 Nicotine dependence, cigarettes, uncomplicated: Secondary | ICD-10-CM

## 2017-10-17 DIAGNOSIS — F3162 Bipolar disorder, current episode mixed, moderate: Secondary | ICD-10-CM | POA: Diagnosis not present

## 2017-10-17 DIAGNOSIS — G4700 Insomnia, unspecified: Secondary | ICD-10-CM

## 2017-10-17 MED ORDER — ALPRAZOLAM 0.5 MG PO TABS: ORAL_TABLET | ORAL | 3 refills | Status: DC

## 2017-10-17 MED ORDER — QUETIAPINE FUMARATE 100 MG PO TABS: 100.0000 mg | ORAL_TABLET | Freq: Every day | ORAL | 3 refills | Status: DC

## 2017-10-17 MED ORDER — TEMAZEPAM 30 MG PO CAPS: 30.0000 mg | ORAL_CAPSULE | Freq: Every day | ORAL | 3 refills | Status: DC

## 2017-10-17 MED ORDER — DIVALPROEX SODIUM ER 500 MG PO TB24: ORAL_TABLET | ORAL | 2 refills | Status: DC
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
## 2018-01-02 ENCOUNTER — Other Ambulatory Visit: Payer: Self-pay | Admitting: Adult Health

## 2018-01-23 ENCOUNTER — Ambulatory Visit (INDEPENDENT_AMBULATORY_CARE_PROVIDER_SITE_OTHER): Payer: PRIVATE HEALTH INSURANCE | Admitting: Psychiatry

## 2018-01-23 ENCOUNTER — Encounter (HOSPITAL_COMMUNITY): Payer: Self-pay | Admitting: Psychiatry

## 2018-01-23 VITALS — BP 130/92 | HR 113 | Ht 63.0 in | Wt 134.0 lb

## 2018-01-23 DIAGNOSIS — F3162 Bipolar disorder, current episode mixed, moderate: Secondary | ICD-10-CM | POA: Diagnosis not present

## 2018-01-23 MED ORDER — QUETIAPINE FUMARATE 100 MG PO TABS: 100.0000 mg | ORAL_TABLET | Freq: Every day | ORAL | 3 refills | Status: DC

## 2018-01-23 MED ORDER — DIVALPROEX SODIUM ER 500 MG PO TB24: ORAL_TABLET | ORAL | 3 refills | Status: DC

## 2018-01-23 MED ORDER — TEMAZEPAM 30 MG PO CAPS: 30.0000 mg | ORAL_CAPSULE | Freq: Every day | ORAL | 3 refills | Status: DC

## 2018-01-23 MED ORDER — ALPRAZOLAM 0.5 MG PO TABS: ORAL_TABLET | ORAL | 3 refills | Status: DC
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
## 2018-02-01 ENCOUNTER — Other Ambulatory Visit: Payer: Self-pay | Admitting: Adult Health

## 2018-02-01 MED ORDER — TOLTERODINE TARTRATE ER 2 MG PO CP24: ORAL_CAPSULE | ORAL | 6 refills | Status: DC

## 2018-02-01 NOTE — Progress Notes (Signed)
Refill detrol

## 2018-02-07 ENCOUNTER — Telehealth: Payer: Self-pay | Admitting: Family Medicine

## 2018-02-07 DIAGNOSIS — Z1211 Encounter for screening for malignant neoplasm of colon: Secondary | ICD-10-CM

## 2018-02-07 NOTE — Telephone Encounter (Signed)
Referral put in.

## 2018-02-07 NOTE — Telephone Encounter (Signed)
Pt called requesting a referral to Dr. Darrick Penna for a screening colonoscopy  If ok, please initiate referral in system so that I may process

## 2018-02-08 ENCOUNTER — Encounter: Payer: Self-pay | Admitting: Family Medicine

## 2018-02-08 ENCOUNTER — Other Ambulatory Visit (HOSPITAL_COMMUNITY): Payer: Self-pay | Admitting: Psychiatry

## 2018-02-08 ENCOUNTER — Ambulatory Visit (HOSPITAL_COMMUNITY)
Admission: RE | Admit: 2018-02-08 | Discharge: 2018-02-08 | Disposition: A | Discharge status: Home / Self Care | Payer: PRIVATE HEALTH INSURANCE | Source: Ambulatory Visit | Attending: Family Medicine | Admitting: Family Medicine

## 2018-02-08 ENCOUNTER — Other Ambulatory Visit (HOSPITAL_COMMUNITY)
Admission: RE | Admit: 2018-02-08 | Discharge: 2018-02-08 | Disposition: A | Discharge status: Home / Self Care | Payer: PRIVATE HEALTH INSURANCE | Source: Ambulatory Visit | Attending: Family Medicine | Admitting: Family Medicine

## 2018-02-08 ENCOUNTER — Ambulatory Visit (INDEPENDENT_AMBULATORY_CARE_PROVIDER_SITE_OTHER): Payer: PRIVATE HEALTH INSURANCE | Admitting: Family Medicine

## 2018-02-08 VITALS — BP 116/82 | Temp 98.2°F | Wt 136.2 lb

## 2018-02-08 DIAGNOSIS — R109 Unspecified abdominal pain: Secondary | ICD-10-CM

## 2018-02-08 DIAGNOSIS — R1084 Generalized abdominal pain: Secondary | ICD-10-CM

## 2018-02-08 LAB — HEPATIC FUNCTION PANEL
ALT: 23 U/L (ref 0–44)
AST: 26 U/L (ref 15–41)
Albumin: 4.4 g/dL (ref 3.5–5.0)
Alkaline Phosphatase: 57 U/L (ref 38–126)
BILIRUBIN DIRECT: 0.1 mg/dL (ref 0.0–0.2)
Indirect Bilirubin: 0.2 mg/dL — ABNORMAL LOW (ref 0.3–0.9)
Total Bilirubin: 0.3 mg/dL (ref 0.3–1.2)
Total Protein: 7.3 g/dL (ref 6.5–8.1)

## 2018-02-08 LAB — CBC WITH DIFFERENTIAL/PLATELET
Abs Immature Granulocytes: 0.02 10*3/uL (ref 0.00–0.07)
BASOS PCT: 1 %
Basophils Absolute: 0.1 10*3/uL (ref 0.0–0.1)
EOS PCT: 2 %
Eosinophils Absolute: 0.2 10*3/uL (ref 0.0–0.5)
HCT: 45.9 % (ref 36.0–46.0)
Hemoglobin: 14.8 g/dL (ref 12.0–15.0)
Immature Granulocytes: 0 %
Lymphocytes Relative: 21 %
Lymphs Abs: 1.8 10*3/uL (ref 0.7–4.0)
MCH: 32 pg (ref 26.0–34.0)
MCHC: 32.2 g/dL (ref 30.0–36.0)
MCV: 99.1 fL (ref 80.0–100.0)
Monocytes Absolute: 0.6 10*3/uL (ref 0.1–1.0)
Monocytes Relative: 7 %
NRBC: 0 % (ref 0.0–0.2)
Neutro Abs: 6.1 10*3/uL (ref 1.7–7.7)
Neutrophils Relative %: 69 %
Platelets: 252 10*3/uL (ref 150–400)
RBC: 4.63 MIL/uL (ref 3.87–5.11)
RDW: 13.3 % (ref 11.5–15.5)
WBC: 8.7 10*3/uL (ref 4.0–10.5)

## 2018-02-08 LAB — BASIC METABOLIC PANEL
Anion gap: 9 (ref 5–15)
BUN: 15 mg/dL (ref 6–20)
CO2: 28 mmol/L (ref 22–32)
Calcium: 9.6 mg/dL (ref 8.9–10.3)
Chloride: 103 mmol/L (ref 98–111)
Creatinine, Ser: 0.63 mg/dL (ref 0.44–1.00)
GFR calc Af Amer: >60 mL/min (ref >60)
GFR calc non Af Amer: >60 mL/min (ref >60)
GLUCOSE: 116 mg/dL — AB (ref 70–99)
Potassium: 4 mmol/L (ref 3.5–5.1)
Sodium: 140 mmol/L (ref 135–145)

## 2018-02-08 LAB — POCT URINALYSIS DIPSTICK
Spec Grav, UA: 1.01 (ref 1.010–1.025)
pH, UA: 6 (ref 5.0–8.0)

## 2018-02-08 LAB — D-DIMER, QUANTITATIVE: D-Dimer, Quant: 0.3 ug/mL-FEU (ref 0.00–0.50)

## 2018-02-08 MED ORDER — ETODOLAC 400 MG PO TABS: ORAL_TABLET | ORAL | 0 refills | Status: DC

## 2018-02-08 NOTE — Progress Notes (Signed)
Subjective:    Patient ID: Anne Logan, female    DOB: October 09, 1965, 53 y.o.   MRN: [id number]  HPI Patient arrives with right side pain in the kidney area off and on for over a week. Patient states the pain got worse last night and was really sharp and took her breath away at times. Reports pain worse with lying down and taking deep breaths, states last night had to sleep in a chair.  Denies any injury or new physical activities. Denies any recent cough. Pain worse with movement.   Denies any dysuria or hematuria. Reports hx of kidney stone 25 years ago. No N/V/D. No blood in stool. Reports hx of constipation off and on, LBM this morning. No fevers.   She is not on any form of hormone pills. She is a current smoker.   Review of Systems  Constitutional: Negative for chills and fever.  Respiratory: Negative for cough and shortness of breath.   Gastrointestinal: Positive for constipation. Negative for abdominal pain, blood in stool, diarrhea, nausea and vomiting.  Genitourinary: Positive for flank pain. Negative for difficulty urinating, dysuria, frequency, hematuria, vaginal bleeding and vaginal discharge.       Objective:   Physical Exam Vitals signs and nursing note reviewed.  Constitutional:      General: She is not in acute distress.    Appearance: She is not toxic-appearing.  HENT:     Head: Normocephalic and atraumatic.  Neck:     Musculoskeletal: Neck supple.  Cardiovascular:     Rate and Rhythm: Normal rate and regular rhythm.     Heart sounds: Normal heart sounds.  Pulmonary:     Effort: Pulmonary effort is normal. No respiratory distress.     Breath sounds: Normal breath sounds.  Chest:     Chest wall: Tenderness (mild right-sided lateral chest wall tenderness present) present.  Abdominal:     General: Abdomen is flat. Bowel sounds are normal. There is no distension.     Palpations: Abdomen is soft. There is no mass.     Tenderness: There is generalized abdominal  tenderness and tenderness in the right lower quadrant and left lower quadrant. There is no right CVA tenderness or left CVA tenderness.  Skin:    General: Skin is warm and dry.  Neurological:     Mental Status: She is alert and oriented to person, place, and time.     Results for orders placed or performed in visit on 02/08/18  POCT urinalysis dipstick  Result Value Ref Range   Color, UA     Clarity, UA     Glucose, UA     Bilirubin, UA     Ketones, UA     Spec Grav, UA 1.010 1.010 - 1.025   Blood, UA     pH, UA 6.0 5.0 - 8.0   Protein, UA     Urobilinogen, UA     Nitrite, UA     Leukocytes, UA     Appearance     Odor     Microscopic exam of urine, no WBCs or RBCs noted, rare epithelial cell    Assessment & Plan:  Right flank pain - Plan: POCT urinalysis dipstick, DG Chest 2 View, US Abdomen Complete, CBC with Differential/Platelet, Basic metabolic panel, Hepatic function panel, D-dimer, quantitative (not at [facility])  Generalized abdominal pain - Plan: DG Chest 2 View, US Abdomen Complete, CBC with Differential/Platelet, Basic metabolic panel, Hepatic function panel, D-dimer, quantitative (not at [facility])  Discussed with patient that I do not feel this is a kidney stone or related to her kidneys based on her presentation and exam.  Urine is clear.  Given patient has risk factors of smoking recommend we rule out the possibility of a pulmonary embolus.  Will obtain a stat chest x-ray and stat d-dimer.  Likely this is chest wall pain and inflammation, will treat appropriately once stat results are back and more serious conditions are ruled out.  Also recommend with her generalized abdominal pain to rule out the possibility of problems with her gallbladder.  Will obtain stat blood work and right upper quadrant ultrasound.  Patient does not appear toxic, oxygen saturation is normal, lungs clear on exam.  We will follow-up based on results.  Dr. Lubertha South was consulted on this case and is  in agreement with the above treatment plan.

## 2018-02-08 NOTE — Addendum Note (Signed)
Addended by: Margaretha Sheffield on: 02/08/2018 04:20 PM   Modules accepted: Orders

## 2018-02-09 LAB — VALPROIC ACID LEVEL: Valproic Acid Lvl: 88 ug/mL (ref 50–100)

## 2018-04-18 ENCOUNTER — Ambulatory Visit: Payer: Self-pay | Admitting: Gastroenterology

## 2018-04-19 ENCOUNTER — Ambulatory Visit: Payer: Self-pay | Admitting: Gastroenterology

## 2018-05-24 ENCOUNTER — Ambulatory Visit (INDEPENDENT_AMBULATORY_CARE_PROVIDER_SITE_OTHER): Payer: PRIVATE HEALTH INSURANCE | Admitting: Psychiatry

## 2018-05-24 ENCOUNTER — Encounter (HOSPITAL_COMMUNITY): Payer: Self-pay | Admitting: Psychiatry

## 2018-05-24 ENCOUNTER — Other Ambulatory Visit: Payer: Self-pay

## 2018-05-24 DIAGNOSIS — F3162 Bipolar disorder, current episode mixed, moderate: Secondary | ICD-10-CM

## 2018-05-24 DIAGNOSIS — G4700 Insomnia, unspecified: Secondary | ICD-10-CM

## 2018-05-24 MED ORDER — QUETIAPINE FUMARATE 100 MG PO TABS: 100.0000 mg | ORAL_TABLET | Freq: Every day | ORAL | 3 refills | Status: DC

## 2018-05-24 MED ORDER — DIVALPROEX SODIUM ER 500 MG PO TB24: ORAL_TABLET | ORAL | 3 refills | Status: DC

## 2018-05-24 MED ORDER — TEMAZEPAM 30 MG PO CAPS: 30.0000 mg | ORAL_CAPSULE | Freq: Every day | ORAL | 3 refills | Status: DC

## 2018-05-24 MED ORDER — ALPRAZOLAM 0.5 MG PO TABS: ORAL_TABLET | ORAL | 3 refills | Status: DC
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
## 2018-05-31 ENCOUNTER — Ambulatory Visit: Payer: PRIVATE HEALTH INSURANCE | Admitting: Nurse Practitioner

## 2018-07-04 ENCOUNTER — Other Ambulatory Visit (HOSPITAL_COMMUNITY): Payer: Self-pay | Admitting: Family Medicine

## 2018-07-04 DIAGNOSIS — Z1231 Encounter for screening mammogram for malignant neoplasm of breast: Secondary | ICD-10-CM

## 2018-07-17 ENCOUNTER — Other Ambulatory Visit: Payer: Self-pay

## 2018-07-17 ENCOUNTER — Ambulatory Visit (INDEPENDENT_AMBULATORY_CARE_PROVIDER_SITE_OTHER): Payer: PRIVATE HEALTH INSURANCE | Admitting: Psychiatry

## 2018-07-17 ENCOUNTER — Encounter (HOSPITAL_COMMUNITY): Payer: Self-pay | Admitting: Psychiatry

## 2018-07-17 DIAGNOSIS — F3162 Bipolar disorder, current episode mixed, moderate: Secondary | ICD-10-CM | POA: Diagnosis not present

## 2018-07-17 MED ORDER — QUETIAPINE FUMARATE 100 MG PO TABS: 100.0000 mg | ORAL_TABLET | Freq: Every day | ORAL | 3 refills | Status: DC

## 2018-07-17 MED ORDER — TEMAZEPAM 30 MG PO CAPS: 30.0000 mg | ORAL_CAPSULE | Freq: Every day | ORAL | 3 refills | Status: DC

## 2018-07-17 MED ORDER — DIVALPROEX SODIUM ER 500 MG PO TB24: ORAL_TABLET | ORAL | 3 refills | Status: DC

## 2018-07-17 MED ORDER — ALPRAZOLAM 1 MG PO TABS: 1.0000 mg | ORAL_TABLET | Freq: Three times a day (TID) | ORAL | 2 refills | Status: DC | PRN
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
## 2018-08-14 ENCOUNTER — Other Ambulatory Visit: Payer: Self-pay

## 2018-08-14 ENCOUNTER — Ambulatory Visit (HOSPITAL_COMMUNITY)
Admission: RE | Admit: 2018-08-14 | Discharge: 2018-08-14 | Disposition: A | Discharge status: Home / Self Care | Payer: PRIVATE HEALTH INSURANCE | Source: Ambulatory Visit | Attending: Family Medicine | Admitting: Family Medicine

## 2018-08-14 DIAGNOSIS — Z1231 Encounter for screening mammogram for malignant neoplasm of breast: Secondary | ICD-10-CM | POA: Diagnosis not present

## 2018-08-24 ENCOUNTER — Other Ambulatory Visit: Payer: Self-pay

## 2018-08-24 ENCOUNTER — Ambulatory Visit (INDEPENDENT_AMBULATORY_CARE_PROVIDER_SITE_OTHER): Payer: PRIVATE HEALTH INSURANCE | Admitting: Psychiatry

## 2018-08-24 ENCOUNTER — Encounter (HOSPITAL_COMMUNITY): Payer: Self-pay | Admitting: Psychiatry

## 2018-08-24 DIAGNOSIS — F419 Anxiety disorder, unspecified: Secondary | ICD-10-CM

## 2018-08-24 DIAGNOSIS — G4700 Insomnia, unspecified: Secondary | ICD-10-CM | POA: Diagnosis not present

## 2018-08-24 DIAGNOSIS — F3162 Bipolar disorder, current episode mixed, moderate: Secondary | ICD-10-CM

## 2018-08-24 MED ORDER — ESTAZOLAM 2 MG PO TABS: 2.0000 mg | ORAL_TABLET | Freq: Every day | ORAL | 2 refills | Status: DC

## 2018-08-24 MED ORDER — DIVALPROEX SODIUM ER 500 MG PO TB24: ORAL_TABLET | ORAL | 3 refills | Status: DC

## 2018-08-24 MED ORDER — QUETIAPINE FUMARATE 100 MG PO TABS: 100.0000 mg | ORAL_TABLET | Freq: Every day | ORAL | 3 refills | Status: DC

## 2018-08-24 MED ORDER — ALPRAZOLAM 1 MG PO TABS: 1.0000 mg | ORAL_TABLET | Freq: Three times a day (TID) | ORAL | 2 refills | Status: DC | PRN
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
## 2018-08-25 ENCOUNTER — Telehealth (HOSPITAL_COMMUNITY): Payer: Self-pay | Admitting: *Deleted

## 2018-08-25 ENCOUNTER — Other Ambulatory Visit (HOSPITAL_COMMUNITY): Payer: Self-pay | Admitting: Psychiatry

## 2018-08-25 MED ORDER — ZOLPIDEM TARTRATE ER 12.5 MG PO TBCR: 12.5000 mg | EXTENDED_RELEASE_TABLET | Freq: Every evening | ORAL | 2 refills | Status: DC | PRN

## 2018-08-25 NOTE — Telephone Encounter (Signed)
SPOKE WITH PATIENT PER PROVIDER: sent in [place], she has only tried regular [place]. Please inform patient

## 2018-08-25 NOTE — Telephone Encounter (Signed)
PATIENT CALLED THIS AM STATING SHE RECEIVED A CALL FROM HER [facility] THAT THE SCRIPT SENT IN estazolam (PROSOM) 2 MG tablet HAS BEEN DISCONTINUED BY THE MANUFACTURER

## 2018-08-25 NOTE — Telephone Encounter (Signed)
Ask pharmacy if they can get the 1 mg dose

## 2018-08-25 NOTE — Telephone Encounter (Signed)
I sent in [place], she has only tried regular [place]. Please inform patient

## 2018-08-25 NOTE — Telephone Encounter (Signed)
SPOKE WITH THE PHARMACIST & IT'S BEEN DISCONTINUED IN THEIR WAREHOUSE & THEY ARE UNABLE  TO OBTAIN. PHARMACIST CHECK LIST OF MED'S SHE HAS TRIED Anne Logan FOR SLEEP & HE SAID ONLY OTHER THAT HASN'T BEEN TRIED/TAKEN IS ATIVAN

## 2018-08-31 ENCOUNTER — Other Ambulatory Visit (HOSPITAL_COMMUNITY): Payer: Self-pay | Admitting: Psychiatry

## 2018-08-31 ENCOUNTER — Telehealth (HOSPITAL_COMMUNITY): Payer: Self-pay | Admitting: *Deleted

## 2018-08-31 MED ORDER — TRAZODONE HCL 100 MG PO TABS: ORAL_TABLET | ORAL | 2 refills | Status: DC

## 2018-08-31 NOTE — Telephone Encounter (Signed)
Spoke to patient, will change to trazodone 100 to 200 mg at bedtime

## 2018-08-31 NOTE — Telephone Encounter (Signed)
Patient called & stated she is taking medication as prescribed : Ambien Take 1 tablet (12.5 mg total) by mouth at bedtime as needed for sleep. But she still isn't sleeping. And that she was told to call back to let you know how medication is working.

## 2018-09-14 ENCOUNTER — Other Ambulatory Visit: Payer: Self-pay | Admitting: Adult Health

## 2018-10-24 NOTE — Progress Notes (Addendum)
Primary Care Physician:  Mikey Kirschner, MD  Primary Gastroenterologist:  Barney Drain, MD  REVIEWED-NO ADDITIONAL RECOMMENDATIONS.  Chief Complaint  Patient presents with  . Colonoscopy    never had tcs  . Bloated  . abdominal tenderness    HPI:  Anne Logan is a 53 y.o. female here at the request of Dr. Wolfgang Phoenix for further consideration of colonoscopy.  Initial referral came over January 2020.  Patient has postponed due to [place] pandemic.  She is never had a colonoscopy.  She has a history of anxiety and depression, followed by psychiatry.  Multiple recent medication changes.  She has had some change in bowel habits with this, she wonders if related to medication.  Most recent medication change was the addition of trazodone at night.  Alternates from constipation to diarrhea.  If no bowel movement after 3 days, she will take Dulcolax, 1-2.  She denies any melena or rectal bleeding.  No heartburn or dysphagia.  No vomiting.  Over the past 6 months she has had constant lower abdominal pain without relief.  Unaffected by meals.  No improvement with bowel movements.  Nothing seems to make it better.  When she has abdominal pain she typically has more diarrhea. Occasional shooting pains in the rectum, sporadic. Bloating.  No unintentional weight loss.   No family history of colon cancer.  Labs from January 2020 as outlined below.  Current Outpatient Medications  Medication Sig Dispense Refill  . ALPRAZolam (XANAX) 1 MG tablet Take 1 tablet (1 mg total) by mouth 3 (three) times daily as needed for anxiety. 90 tablet 2  . aspirin-acetaminophen-caffeine (EXCEDRIN MIGRAINE) 250-250-65 MG tablet Take 2 tablets by mouth as needed for headache.    . divalproex (DEPAKOTE ER) 500 MG 24 hr tablet 2 po qd 180 tablet 3  . docusate sodium (COLACE) 100 MG capsule Take 200 mg by mouth at bedtime.    Marland Kitchen QUEtiapine (SEROQUEL) 100 MG tablet Take 1 tablet (100 mg total) by mouth at bedtime. 90 tablet 3   . tolterodine (DETROL LA) 2 MG 24 hr capsule TAKE 1 CAPSULE BY MOUTH EVERY DAY 30 capsule 1  . traZODone (DESYREL) 100 MG tablet Take one to two at bedtime for sleep (Patient taking differently: 200 mg at bedtime. Take one to two at bedtime for sleep) 60 tablet 2   No current facility-administered medications for this visit.     Allergies as of 10/25/2018  . (No Known Allergies)    Past Medical History:  Diagnosis Date  . Anxiety   . Depression     Past Surgical History:  Procedure Laterality Date  . TUBAL LIGATION Bilateral 2005    Family History  Problem Relation Age of Onset  . Depression Mother   . Cancer Mother        lymphoma  . Alcohol abuse Father   . Drug abuse Son   . Colon cancer Neg Hx     Social History   Socioeconomic History  . Marital status: Married    Spouse name: Not on file  . Number of children: Not on file  . Years of education: Not on file  . Highest education level: Not on file  Occupational History  . Not on file  Social Needs  . Financial resource strain: Not on file  . Food insecurity    Worry: Not on file    Inability: Not on file  . Transportation needs    Medical: Not on file  Non-medical: Not on file  Tobacco Use  . Smoking status: Current Every Day Smoker    Packs/day: 0.00    Years: 20.00    Pack years: 0.00    Types: Cigarettes  . Smokeless tobacco: Never Used  . Tobacco comment: smokes 10 cig daily  Substance and Sexual Activity  . Alcohol use: No    Alcohol/week: 0.0 standard drinks  . Drug use: No  . Sexual activity: Not Currently    Partners: Male    Birth control/protection: Surgical    Comment: tubal  Lifestyle  . Physical activity    Days per week: Not on file    Minutes per session: Not on file  . Stress: Not on file  Relationships  . Social Musicianconnections    Talks on phone: Not on file    Gets together: Not on file    Attends religious service: Not on file    Active member of club or organization:  Not on file    Attends meetings of clubs or organizations: Not on file    Relationship status: Not on file  . Intimate partner violence    Fear of current or ex partner: Not on file    Emotionally abused: Not on file    Physically abused: Not on file    Forced sexual activity: Not on file  Other Topics Concern  . Not on file  Social History Narrative  . Not on file      ROS:  General: Negative for anorexia, weight loss, fever, chills, fatigue, weakness. Eyes: Negative for vision changes.  ENT: Negative for hoarseness, difficulty swallowing , nasal congestion. CV: Negative for chest pain, angina, palpitations, dyspnea on exertion, peripheral edema.  Respiratory: Negative for dyspnea at rest, dyspnea on exertion, cough, sputum, wheezing.  GI: See history of present illness. GU:  Negative for dysuria, hematuria, urinary incontinence, urinary frequency, nocturnal urination.  MS: Negative for joint pain, low back pain.  Derm: Negative for rash or itching.  Neuro: Negative for weakness, abnormal sensation, seizure, frequent headaches, memory loss, confusion.  Positive tremors  psych: Negative for  suicidal ideation, hallucinations.  Positive anxiety and depression Endo: Negative for unusual weight change.  Heme: Negative for bruising or bleeding. Allergy: Negative for rash or hives.    Physical Examination:  BP 133/87   Pulse 97   Temp (!) 96.9 F (36.1 C) (Temporal)   Ht 5\' 4"  (1.626 m)   Wt 130 lb (59 kg)   LMP 04/13/2015   BMI 22.31 kg/m    General: Well-nourished, well-developed in no acute distress.  Anxious appearing Head: Normocephalic, atraumatic.   Eyes: Conjunctiva pink, no icterus. Mouth: Oropharyngeal mucosa moist and pink , no lesions erythema or exudate. Neck: Supple without thyromegaly, masses, or lymphadenopathy.  Lungs: Clear to auscultation bilaterally.  Heart: Regular rate and rhythm, no murmurs rubs or gallops.  Abdomen: Bowel sounds are normal,  nondistended, no hepatosplenomegaly or masses, no abdominal bruits or    hernia , no rebound or guarding.  Moderate lower abdominal tenderness to deep palpation, mild tenderness epigastric area mostly over the xiphoid process. Rectal: Poor Extremities: No lower extremity edema. No clubbing or deformities.  Neuro: Alert and oriented x 4 , grossly normal neurologically.  Skin: Warm and dry, no rash or jaundice.   Psych: Alert and cooperative, normal mood and affect.  Labs: Lab Results  Component Value Date   CREATININE 0.63 02/08/2018   BUN 15 02/08/2018   NA 140 02/08/2018  K 4.0 02/08/2018   CL 103 02/08/2018   CO2 28 02/08/2018   Lab Results  Component Value Date   ALT 23 02/08/2018   AST 26 02/08/2018   ALKPHOS 57 02/08/2018   BILITOT 0.3 02/08/2018   Lab Results  Component Value Date   WBC 8.7 02/08/2018   HGB 14.8 02/08/2018   HCT 45.9 02/08/2018   MCV 99.1 02/08/2018   PLT 252 02/08/2018     Imaging Studies: No results found.

## 2018-10-25 ENCOUNTER — Encounter: Payer: Self-pay | Admitting: Gastroenterology

## 2018-10-25 ENCOUNTER — Other Ambulatory Visit: Payer: Self-pay | Admitting: Gastroenterology

## 2018-10-25 ENCOUNTER — Encounter: Payer: Self-pay | Admitting: *Deleted

## 2018-10-25 ENCOUNTER — Other Ambulatory Visit: Payer: Self-pay | Admitting: *Deleted

## 2018-10-25 ENCOUNTER — Other Ambulatory Visit: Payer: Self-pay

## 2018-10-25 ENCOUNTER — Telehealth: Payer: Self-pay | Admitting: *Deleted

## 2018-10-25 ENCOUNTER — Ambulatory Visit (INDEPENDENT_AMBULATORY_CARE_PROVIDER_SITE_OTHER): Payer: PRIVATE HEALTH INSURANCE | Admitting: Gastroenterology

## 2018-10-25 VITALS — BP 133/87 | HR 97 | Temp 96.9°F | Ht 64.0 in | Wt 130.0 lb

## 2018-10-25 DIAGNOSIS — Z1211 Encounter for screening for malignant neoplasm of colon: Secondary | ICD-10-CM | POA: Insufficient documentation

## 2018-10-25 DIAGNOSIS — R1030 Lower abdominal pain, unspecified: Secondary | ICD-10-CM | POA: Insufficient documentation

## 2018-10-25 MED ORDER — NA SULFATE-K SULFATE-MG SULF 17.5-3.13-1.6 GM/177ML PO SOLN: 1.0000 | Freq: Once | ORAL | 0 refills | Status: AC

## 2018-10-25 NOTE — Patient Instructions (Signed)
1. CT of your abdomen to work up your lower abdominal pain.  2. Colonoscopy as scheduled. See separate instructions.

## 2018-10-25 NOTE — Assessment & Plan Note (Signed)
First-ever colonoscopy in the near future.  Due to polypharmacy and significant anxiety, plan for deep sedation.  I have discussed the risks, alternatives, benefits with regards to but not limited to the risk of reaction to medication, bleeding, infection, perforation and the patient is agreeable to proceed. Written consent to be obtained.

## 2018-10-25 NOTE — Telephone Encounter (Signed)
Called [employer] and spoke with Robin. No PA is required for TCS  PA is required for CT A/P. Ref# [id number]. Clinicals faxed to [place] B at [phone]

## 2018-10-25 NOTE — Assessment & Plan Note (Addendum)
33-month history of constant lower abdominal discomfort, unrelieved with BMs.  Fluctuates between constipation and diarrhea but more diarrhea lately.  Diarrhea associated with worsening abdominal pain.  She denies any relief of abdominal pain.  Given chronicity of symptoms, doubt appendicitis.  Cannot exclude complicated diverticulitis, underlying malignancy.  Prior to colonoscopy, obtain CT abdomen pelvis with contrast.  Update labs.  Further recommendations to follow.

## 2018-10-25 NOTE — Addendum Note (Signed)
Addended by: Cheron Every on: 10/25/2018 04:27 PM   Modules accepted: Orders

## 2018-10-26 LAB — CBC WITH DIFFERENTIAL/PLATELET
Absolute Monocytes: 363 cells/uL (ref 200–950)
Basophils Absolute: 50 cells/uL (ref 0–200)
Basophils Relative: 0.9 %
Eosinophils Absolute: 121 cells/uL (ref 15–500)
Eosinophils Relative: 2.2 %
HCT: 41.7 % (ref 35.0–45.0)
Hemoglobin: 14.5 g/dL (ref 11.7–15.5)
Lymphs Abs: 1507 cells/uL (ref 850–3900)
MCH: 32.5 pg (ref 27.0–33.0)
MCHC: 34.8 g/dL (ref 32.0–36.0)
MCV: 93.5 fL (ref 80.0–100.0)
MPV: 10.8 fL (ref 7.5–12.5)
Monocytes Relative: 6.6 %
Neutro Abs: 3460 cells/uL (ref 1500–7800)
Neutrophils Relative %: 62.9 %
Platelets: 246 10*3/uL (ref 140–400)
RBC: 4.46 10*6/uL (ref 3.80–5.10)
RDW: 12.8 % (ref 11.0–15.0)
Total Lymphocyte: 27.4 %
WBC: 5.5 10*3/uL (ref 3.8–10.8)

## 2018-10-26 LAB — COMPLETE METABOLIC PANEL WITH GFR
AG Ratio: 2 (calc) (ref 1.0–2.5)
ALT: 13 U/L (ref 6–29)
AST: 17 U/L (ref 10–35)
Albumin: 4.4 g/dL (ref 3.6–5.1)
Alkaline phosphatase (APISO): 49 U/L (ref 37–153)
BUN: 17 mg/dL (ref 7–25)
CO2: 30 mmol/L (ref 20–32)
Calcium: 9.6 mg/dL (ref 8.6–10.4)
Chloride: 101 mmol/L (ref 98–110)
Creat: 0.74 mg/dL (ref 0.50–1.05)
GFR, Est African American: 107 mL/min/{1.73_m2} (ref >=60)
GFR, Est Non African American: 92 mL/min/{1.73_m2} (ref >=60)
Globulin: 2.2 g/dL (calc) (ref 1.9–3.7)
Glucose, Bld: 106 mg/dL (ref 65–139)
Potassium: 4.3 mmol/L (ref 3.5–5.3)
Sodium: 138 mmol/L (ref 135–146)
Total Bilirubin: 0.2 mg/dL (ref 0.2–1.2)
Total Protein: 6.6 g/dL (ref 6.1–8.1)

## 2018-10-26 LAB — LIPASE: Lipase: 22 U/L (ref 7–60)

## 2018-10-26 NOTE — Telephone Encounter (Signed)
Received called from [place] B and PA was approved. Auth# [id number] dates 10/26/2018-01/24/2019

## 2018-10-30 ENCOUNTER — Encounter: Payer: Self-pay | Admitting: *Deleted

## 2018-11-01 ENCOUNTER — Telehealth: Payer: Self-pay | Admitting: Gastroenterology

## 2018-11-01 NOTE — Telephone Encounter (Signed)
Pt was calling to see if her labs results were back. Please call [phone]

## 2018-11-02 NOTE — Telephone Encounter (Signed)
PT is aware of lab results.

## 2018-11-03 ENCOUNTER — Other Ambulatory Visit: Payer: Self-pay

## 2018-11-03 ENCOUNTER — Encounter: Payer: Self-pay | Admitting: Nurse Practitioner

## 2018-11-03 ENCOUNTER — Ambulatory Visit (INDEPENDENT_AMBULATORY_CARE_PROVIDER_SITE_OTHER): Payer: PRIVATE HEALTH INSURANCE | Admitting: Nurse Practitioner

## 2018-11-03 VITALS — BP 128/72 | Temp 97.6°F | Wt 129.0 lb

## 2018-11-03 DIAGNOSIS — R202 Paresthesia of skin: Secondary | ICD-10-CM

## 2018-11-03 DIAGNOSIS — H538 Other visual disturbances: Secondary | ICD-10-CM

## 2018-11-03 DIAGNOSIS — R200 Anesthesia of skin: Secondary | ICD-10-CM | POA: Diagnosis not present

## 2018-11-03 NOTE — Progress Notes (Signed)
   Subjective:    Patient ID: Anne Logan, female    DOB: 1965-08-01, 53 y.o.   MRN: [id number]  HPI Pt is having numbness and tingling in both hands and both feet. Going on for about 3 months. Only thing new is the patient psychiatrist placed her on Trazodone 200 mg nightly for sleep. Some swelling in feet.   Balance is also off. Also been about 3 months. Pt has not had any falls.    Review of Systems     Objective:   Physical Exam        Assessment & Plan:

## 2018-11-03 NOTE — Progress Notes (Signed)
   Subjective:    Patient ID: Anne Logan, female    DOB: [birth year], 53 y.o.   MRN: [id number]  HPI Pt is having numbness and tingling in both hands and both feet. Going on for about 3 months now. Pt stated that symptoms appeared 3 days after starting Trazodone 100 mg/day. Initially, it started off with increasing blurred vision. After 6 days of starting Trazodone, pt was still having trouble with insomnia. At that time dose was increased to 200 mg per day.  After increasing Trazodone, pt noticed numbness and tingling of her upper extremities. Accompanied symptoms include nausea and vomiting. Nothing makes it better or worse. Additionally, pt states "balance has been off", however she has not had any falls. Pt did not have any of these symptoms prior to starting Trazodone. Pt denies headaches or syncope. Pt recently had eye exam, no acute changes.   ROS General: Denies fatigue, fever, or chills. HEENT: Positive for blurred vision. Denies eye pain, ear pain, or sinus pain. Cardiovascular: Denies chest pain or palpitations. Respiratory: Denies shortness of breath, cough, wheezing, or rhonchi. Neurological: Positive for numbness and tingling in the upper extremities. Denies syncope, facial asymmetry, speech difficulty, light-headedness, or headache.   Musculoskeletal: Denies gait disturbances or weakness.      Objective:   Physical Exam: General: Alert and oriented. Well-appearing woman. Cheerful affect. HEENT: Extraocular movements intact. No facial droop noted. Nasolabial folds symmetrical.  Cardiovascular: Regular rate and rhythm. No murmurs present.  Respiratory: Normal lung sounds throughout all lung fields. No wheezing or rhonchi present. Neurological: Cranial nerves intact. Normal speech.  Bicep, brachioradialis, and achilles reflexes are +2 bilaterally. Patellar reflex +3 bilaterally. Hand strength equal bilaterally. Normal gait. Negative Romberg test.        Assessment & Plan:   Problem List Items Addressed This Visit    None    Visit Diagnoses    Numbness and tingling of both upper extremities    -  Primary   Blurred vision, bilateral         -Possible adverse medication reaction to Trazodone. Instructed pt to decrease Trazodone to 100 mg daily for 3 days. On Monday, decrease to half a tablet until meeting with psychiatrist on Wednesday. Instructions provided on AVS. Monitor symptoms for improvement. If no improvement or worsening symptoms, please schedule follow-up. If patient has numbness or tingling with one-sided weakness, extreme headache, slurred speech, or facial drop, please see ED immediately.  Return if symptoms worsen or fail to improve.

## 2018-11-03 NOTE — Patient Instructions (Addendum)
Starting today, decrease your Trazodone to 100 mg (one tablet) per day for 3 days. On Monday, cut pill in half (50 mg) until appointment on Wednesday with psychiatrist.

## 2018-11-06 ENCOUNTER — Ambulatory Visit (HOSPITAL_COMMUNITY)
Admission: RE | Admit: 2018-11-06 | Discharge: 2018-11-06 | Disposition: A | Discharge status: Home / Self Care | Payer: PRIVATE HEALTH INSURANCE | Source: Ambulatory Visit | Attending: Gastroenterology | Admitting: Gastroenterology

## 2018-11-06 ENCOUNTER — Other Ambulatory Visit: Payer: Self-pay

## 2018-11-06 DIAGNOSIS — R1030 Lower abdominal pain, unspecified: Secondary | ICD-10-CM | POA: Diagnosis present

## 2018-11-06 MED ORDER — IOHEXOL 300 MG/ML  SOLN
100.0000 mL | Freq: Once | INTRAMUSCULAR | Status: AC | PRN
  Administered 2018-11-06: 100 mL via INTRAVENOUS

## 2018-11-08 ENCOUNTER — Other Ambulatory Visit: Payer: Self-pay

## 2018-11-08 ENCOUNTER — Encounter (HOSPITAL_COMMUNITY): Payer: Self-pay | Admitting: Psychiatry

## 2018-11-08 ENCOUNTER — Ambulatory Visit (INDEPENDENT_AMBULATORY_CARE_PROVIDER_SITE_OTHER): Payer: PRIVATE HEALTH INSURANCE | Admitting: Psychiatry

## 2018-11-08 DIAGNOSIS — F3162 Bipolar disorder, current episode mixed, moderate: Secondary | ICD-10-CM | POA: Diagnosis not present

## 2018-11-08 MED ORDER — QUETIAPINE FUMARATE 100 MG PO TABS: 100.0000 mg | ORAL_TABLET | Freq: Every day | ORAL | 3 refills | Status: DC

## 2018-11-08 MED ORDER — ALPRAZOLAM 1 MG PO TABS: 1.0000 mg | ORAL_TABLET | Freq: Three times a day (TID) | ORAL | 2 refills | Status: DC | PRN

## 2018-11-08 MED ORDER — DIVALPROEX SODIUM ER 500 MG PO TB24: ORAL_TABLET | ORAL | 3 refills | Status: DC
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
## 2018-11-17 ENCOUNTER — Encounter: Payer: Self-pay | Admitting: Nurse Practitioner

## 2018-11-17 ENCOUNTER — Ambulatory Visit (INDEPENDENT_AMBULATORY_CARE_PROVIDER_SITE_OTHER): Payer: PRIVATE HEALTH INSURANCE | Admitting: Nurse Practitioner

## 2018-11-17 ENCOUNTER — Other Ambulatory Visit: Payer: Self-pay

## 2018-11-17 VITALS — BP 138/88 | Temp 97.4°F | Ht 64.0 in | Wt 126.0 lb

## 2018-11-17 DIAGNOSIS — Z13220 Encounter for screening for lipoid disorders: Secondary | ICD-10-CM

## 2018-11-17 DIAGNOSIS — Z720 Tobacco use: Secondary | ICD-10-CM

## 2018-11-17 DIAGNOSIS — E785 Hyperlipidemia, unspecified: Secondary | ICD-10-CM | POA: Diagnosis not present

## 2018-11-17 DIAGNOSIS — Z79899 Other long term (current) drug therapy: Secondary | ICD-10-CM

## 2018-11-17 DIAGNOSIS — R5382 Chronic fatigue, unspecified: Secondary | ICD-10-CM

## 2018-11-17 NOTE — Progress Notes (Signed)
   Subjective:    Patient ID: Anne Logan, female    DOB: 1965/08/02, 53 y.o.   MRN: [id number]  HPI Follow up on ct scan results.  Patient states she received word that she has emphysema based on CT scan she had done for GI.  Review of Systems No CP/ischemic type pain or SOB. No edema. No change in her breathing. Continues to smoke about 1/2 ppd. Remains under tremendous stress. Continues follow up with Dr. Harrington Challenger her psychiatrist. Has stopped the Trazodone. Some of the symptoms from previous visit has resolved. Continue to have chronic tremors. Mainly her symptoms get worse with her anxiety.     Objective:   Physical Exam NAD. Alert, oriented. Frequent shaking and tremors especially at the beginning of the visit. Anxious affect. Thoughts logical, coherent and relevant. Lungs clear, BS mildly diminished in general. O2 sat room air 96%. Heart RRR. No murmur or gallop noted. Abdomen soft mildly distended. No abdominal bruits noted.  CT scan of the abd dated 11/06/18: Lower chest no acute abnormality. Emphysema noted but no details. Vascular: aortic atherosclerosis; no details.        Assessment & Plan:  Screening for lipid disorders - Plan: Lipid panel  Chronic fatigue - Plan: TSH  Tobacco use  Lengthy discussion around smoking cessation and control of cholesterol for prevention of lung disease and CV disease. Due to patient's mental health issues and stress, it is unlikely that she can quit now but encouraged to limit smoking as much as possible. Last lipid was done 05/01/15 so this was reordered. Added TSH due to tremors and fatigue.  Contact office if any changes in her breathing. Warning signs reviewed.  Continue follow up with psychiatrist and GI specialist.  Return for Recheck here as needed; reminded about wellness physical.

## 2018-11-18 ENCOUNTER — Encounter: Payer: Self-pay | Admitting: Nurse Practitioner

## 2018-11-23 ENCOUNTER — Ambulatory Visit (HOSPITAL_COMMUNITY): Payer: PRIVATE HEALTH INSURANCE | Admitting: Psychiatry

## 2018-11-23 LAB — TSH: TSH: 0.562 u[IU]/mL (ref 0.450–4.500)

## 2018-11-23 LAB — LIPID PANEL
Chol/HDL Ratio: 4.8 ratio — ABNORMAL HIGH (ref 0.0–4.4)
Cholesterol, Total: 207 mg/dL — ABNORMAL HIGH (ref 100–199)
HDL: 43 mg/dL (ref >39)
LDL Chol Calc (NIH): 129 mg/dL — ABNORMAL HIGH (ref 0–99)
Triglycerides: 197 mg/dL — ABNORMAL HIGH (ref 0–149)
VLDL Cholesterol Cal: 35 mg/dL (ref 5–40)

## 2018-11-29 ENCOUNTER — Encounter (HOSPITAL_COMMUNITY): Payer: Self-pay | Admitting: Psychiatry

## 2018-11-29 ENCOUNTER — Ambulatory Visit (INDEPENDENT_AMBULATORY_CARE_PROVIDER_SITE_OTHER): Payer: PRIVATE HEALTH INSURANCE | Admitting: Psychiatry

## 2018-11-29 ENCOUNTER — Other Ambulatory Visit: Payer: Self-pay | Admitting: Nurse Practitioner

## 2018-11-29 ENCOUNTER — Other Ambulatory Visit: Payer: Self-pay

## 2018-11-29 DIAGNOSIS — F3162 Bipolar disorder, current episode mixed, moderate: Secondary | ICD-10-CM | POA: Diagnosis not present

## 2018-11-29 MED ORDER — ROSUVASTATIN CALCIUM 5 MG PO TABS: 5.0000 mg | ORAL_TABLET | Freq: Every day | ORAL | 2 refills | Status: DC

## 2018-11-29 MED ORDER — ALPRAZOLAM 1 MG PO TABS: 1.0000 mg | ORAL_TABLET | Freq: Three times a day (TID) | ORAL | 2 refills | Status: DC | PRN

## 2018-11-29 MED ORDER — DIVALPROEX SODIUM ER 500 MG PO TB24: ORAL_TABLET | ORAL | 3 refills | Status: DC

## 2018-11-29 MED ORDER — QUETIAPINE FUMARATE 200 MG PO TABS: 200.0000 mg | ORAL_TABLET | Freq: Every day | ORAL | 2 refills | Status: DC
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
## 2018-11-30 NOTE — Addendum Note (Signed)
Addended by: Dairl Ponder on: 11/30/2018 08:39 AM   Modules accepted: Orders

## 2018-12-06 ENCOUNTER — Other Ambulatory Visit: Payer: Self-pay | Admitting: *Deleted

## 2018-12-06 ENCOUNTER — Telehealth: Payer: Self-pay | Admitting: Gastroenterology

## 2018-12-06 DIAGNOSIS — R112 Nausea with vomiting, unspecified: Secondary | ICD-10-CM

## 2018-12-06 DIAGNOSIS — Z1211 Encounter for screening for malignant neoplasm of colon: Secondary | ICD-10-CM

## 2018-12-06 MED ORDER — METOCLOPRAMIDE HCL 5 MG PO TABS: ORAL_TABLET | ORAL | 11 refills | Status: DC

## 2018-12-06 MED ORDER — PANTOPRAZOLE SODIUM 40 MG PO TBEC: DELAYED_RELEASE_TABLET | ORAL | 11 refills | Status: DC

## 2018-12-06 NOTE — Telephone Encounter (Signed)
I PERSONALLY REVIEWED THE CT WITH DR. Thornton Papas.  PT DEFINITELY HAS EMPHYSEMA.   MOM CAME BY OFFICE. SHE HAS NAUSEA, CAN'T EAT, AND LOSING WEIGHT. WANTS TO EAT.  THROWING UP OFF AND ON. 3 TIMES:(NO VOMIT).   RECOMMENDATIONS: 1. ADD EGD WITH POSSIBLE DILATION WITH MAC ON JAN 5. 2. PROTONIX BID FOR ONE MONTH THEN ONCE DAILY 3. REGLAN 5 MG 30 MINS FIRST AND SECOND MEAL.  CALL FIRST WEEK OF December WITH AN UPDATE.  PT AWARE SHE HAS EMPHYSEMA. PT IS TRYING TO STOP SMOKING.

## 2018-12-06 NOTE — Telephone Encounter (Signed)
Called endo and she is able to add EGD to procedure. New orders placed.  Called [facility] and Spoke with Lanice Schwab and was advised no PA is required for EGD

## 2018-12-18 ENCOUNTER — Telehealth: Payer: Self-pay

## 2018-12-18 DIAGNOSIS — R109 Unspecified abdominal pain: Secondary | ICD-10-CM

## 2018-12-18 MED ORDER — ESOMEPRAZOLE MAGNESIUM 40 MG PO CPDR: DELAYED_RELEASE_CAPSULE | ORAL | 11 refills | Status: DC

## 2018-12-18 MED ORDER — METOCLOPRAMIDE HCL 5 MG PO TABS: ORAL_TABLET | ORAL | 11 refills | Status: DC

## 2018-12-18 NOTE — Telephone Encounter (Signed)
PT is aware of the meds and OK to schedule the Gastric Emptying Study.

## 2018-12-18 NOTE — Addendum Note (Signed)
Addended by: Danie Binder on: 12/18/2018 03:16 PM   Modules accepted: Orders

## 2018-12-18 NOTE — Telephone Encounter (Signed)
Pt called to give Dr. Oneida Alar update on the Protonix and Reglan. She said she is having one of the side effects from Protonix, sore tongue. She said it occurred about the second day after starting the Protonix bid. That seems to be the only side effect. She is still having nausea and abdominal pain when she eats. She had a very bad abdominal pain on Thanksgiving Day and the dayafter. She had only eaten about 3 bites of potatoes and 3 bites of green beans and 1/2 piece of ham and a roll. She said she is getting where she feels like she should just not eat anything.  Dr. Oneida Alar, please advise!

## 2018-12-18 NOTE — Telephone Encounter (Addendum)
PLEASE CALL PT. SHE SHOULD STOP PROTONIX. START CONTINUE Metaline. TAKE 30 MINUTES BEFORE MEALS. INCREASE REGLAN TO [facility] SCHEDULE PT FOR GASTRIC EMPTYING STUDY IN 2 WEEKS.

## 2018-12-19 NOTE — Telephone Encounter (Signed)
Called [facility] and spoke with Vaughan Basta B. No PA is required for GES

## 2018-12-19 NOTE — Addendum Note (Signed)
Addended by: Cheron Every on: 12/19/2018 08:39 AM   Modules accepted: Orders

## 2018-12-19 NOTE — Telephone Encounter (Signed)
ges scheduled for 12/15 at 8:00am, arrival 7:45am, npo midnight, no stomach medications after midnight.   Called pt and she is aware of appt details.

## 2019-01-02 ENCOUNTER — Encounter (HOSPITAL_COMMUNITY): Payer: Self-pay

## 2019-01-02 ENCOUNTER — Other Ambulatory Visit: Payer: Self-pay

## 2019-01-02 ENCOUNTER — Encounter (HOSPITAL_COMMUNITY)
Admission: RE | Admit: 2019-01-02 | Discharge: 2019-01-02 | Disposition: A | Discharge status: Home / Self Care | Payer: PRIVATE HEALTH INSURANCE | Source: Ambulatory Visit | Attending: Gastroenterology | Admitting: Gastroenterology

## 2019-01-02 DIAGNOSIS — R109 Unspecified abdominal pain: Secondary | ICD-10-CM | POA: Insufficient documentation

## 2019-01-02 MED ORDER — TECHNETIUM TC 99M SULFUR COLLOID
2.0000 | Freq: Once | INTRAVENOUS | Status: AC | PRN
  Administered 2019-01-02: 08:00:00 2 via ORAL

## 2019-01-04 ENCOUNTER — Telehealth: Payer: Self-pay | Admitting: Gastroenterology

## 2019-01-04 NOTE — Telephone Encounter (Signed)
PLEASE CALL PT. HER GES IS NORMAL.  COMPLETE EGD WITH POSSIBLE DILATION WITH MAC ON JAN 5. CONTINUE PROTONIX BID FOR ONE MONTH THEN ONCE DAILY CONTINUE REGLAN 5 MG 30 MINS FIRST AND SECOND MEAL.

## 2019-01-04 NOTE — Telephone Encounter (Signed)
Spoke with pt. Pt notified of results and recommendations of SLF. Pt was changed to Nexium by SLF in 11/2018 due to her reactions to Pantoprazole. Pt will continue Nexium.

## 2019-01-17 ENCOUNTER — Ambulatory Visit (INDEPENDENT_AMBULATORY_CARE_PROVIDER_SITE_OTHER): Payer: PRIVATE HEALTH INSURANCE | Admitting: Psychiatry

## 2019-01-17 ENCOUNTER — Encounter (HOSPITAL_COMMUNITY): Payer: Self-pay | Admitting: Psychiatry

## 2019-01-17 ENCOUNTER — Encounter (HOSPITAL_COMMUNITY): Payer: Self-pay

## 2019-01-17 ENCOUNTER — Other Ambulatory Visit: Payer: Self-pay

## 2019-01-17 DIAGNOSIS — G4700 Insomnia, unspecified: Secondary | ICD-10-CM

## 2019-01-17 DIAGNOSIS — F3162 Bipolar disorder, current episode mixed, moderate: Secondary | ICD-10-CM

## 2019-01-17 MED ORDER — QUETIAPINE FUMARATE 200 MG PO TABS: 200.0000 mg | ORAL_TABLET | Freq: Every day | ORAL | 2 refills | Status: DC

## 2019-01-17 MED ORDER — DIVALPROEX SODIUM ER 500 MG PO TB24: 1000.0000 mg | ORAL_TABLET | Freq: Every day | ORAL | 2 refills | Status: DC

## 2019-01-17 MED ORDER — ALPRAZOLAM 1 MG PO TABS: 1.0000 mg | ORAL_TABLET | Freq: Three times a day (TID) | ORAL | 2 refills | Status: DC | PRN
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
## 2019-01-18 ENCOUNTER — Other Ambulatory Visit: Payer: Self-pay

## 2019-01-18 ENCOUNTER — Encounter (HOSPITAL_COMMUNITY)
Admission: RE | Admit: 2019-01-18 | Discharge: 2019-01-18 | Disposition: A | Discharge status: Home / Self Care | Payer: PRIVATE HEALTH INSURANCE | Source: Ambulatory Visit | Attending: Gastroenterology | Admitting: Gastroenterology

## 2019-01-18 ENCOUNTER — Other Ambulatory Visit (HOSPITAL_COMMUNITY): Payer: PRIVATE HEALTH INSURANCE

## 2019-01-18 HISTORY — DX: Gastro-esophageal reflux disease without esophagitis: K21.9

## 2019-01-22 ENCOUNTER — Other Ambulatory Visit (HOSPITAL_COMMUNITY)
Admission: RE | Admit: 2019-01-22 | Discharge: 2019-01-22 | Disposition: A | Discharge status: Home / Self Care | Payer: PRIVATE HEALTH INSURANCE | Source: Ambulatory Visit | Attending: Gastroenterology | Admitting: Gastroenterology

## 2019-01-22 ENCOUNTER — Other Ambulatory Visit: Payer: Self-pay

## 2019-01-22 ENCOUNTER — Other Ambulatory Visit (HOSPITAL_COMMUNITY): Payer: PRIVATE HEALTH INSURANCE

## 2019-01-22 DIAGNOSIS — Z01812 Encounter for preprocedural laboratory examination: Secondary | ICD-10-CM | POA: Insufficient documentation

## 2019-01-22 DIAGNOSIS — Z20822 Contact with and (suspected) exposure to covid-19: Secondary | ICD-10-CM | POA: Insufficient documentation

## 2019-01-22 LAB — SARS CORONAVIRUS 2 (TAT 6-24 HRS): SARS Coronavirus 2: NEGATIVE

## 2019-01-23 ENCOUNTER — Encounter (HOSPITAL_COMMUNITY): Payer: Self-pay | Admitting: Gastroenterology

## 2019-01-23 ENCOUNTER — Ambulatory Visit (HOSPITAL_COMMUNITY): Payer: PRIVATE HEALTH INSURANCE | Admitting: Anesthesiology

## 2019-01-23 ENCOUNTER — Ambulatory Visit (HOSPITAL_COMMUNITY)
Admission: RE | Admit: 2019-01-23 | Discharge: 2019-01-23 | Disposition: A | Discharge status: Home / Self Care | Payer: PRIVATE HEALTH INSURANCE | Attending: Gastroenterology | Admitting: Gastroenterology

## 2019-01-23 ENCOUNTER — Encounter (HOSPITAL_COMMUNITY)
Admission: RE | Disposition: A | Discharge status: Home / Self Care | Payer: Self-pay | Source: Home / Self Care | Attending: Gastroenterology

## 2019-01-23 ENCOUNTER — Ambulatory Visit (HOSPITAL_COMMUNITY): Admit: 2019-01-23 | Payer: PRIVATE HEALTH INSURANCE | Admitting: Gastroenterology

## 2019-01-23 ENCOUNTER — Encounter (HOSPITAL_COMMUNITY): Payer: Self-pay

## 2019-01-23 DIAGNOSIS — F329 Major depressive disorder, single episode, unspecified: Secondary | ICD-10-CM | POA: Diagnosis not present

## 2019-01-23 DIAGNOSIS — K319 Disease of stomach and duodenum, unspecified: Secondary | ICD-10-CM | POA: Diagnosis not present

## 2019-01-23 DIAGNOSIS — K219 Gastro-esophageal reflux disease without esophagitis: Secondary | ICD-10-CM | POA: Insufficient documentation

## 2019-01-23 DIAGNOSIS — K648 Other hemorrhoids: Secondary | ICD-10-CM | POA: Insufficient documentation

## 2019-01-23 DIAGNOSIS — F419 Anxiety disorder, unspecified: Secondary | ICD-10-CM | POA: Diagnosis not present

## 2019-01-23 DIAGNOSIS — R1013 Epigastric pain: Secondary | ICD-10-CM | POA: Diagnosis present

## 2019-01-23 DIAGNOSIS — R1110 Vomiting, unspecified: Secondary | ICD-10-CM

## 2019-01-23 DIAGNOSIS — K2970 Gastritis, unspecified, without bleeding: Secondary | ICD-10-CM

## 2019-01-23 DIAGNOSIS — F17210 Nicotine dependence, cigarettes, uncomplicated: Secondary | ICD-10-CM | POA: Diagnosis not present

## 2019-01-23 DIAGNOSIS — K222 Esophageal obstruction: Secondary | ICD-10-CM | POA: Insufficient documentation

## 2019-01-23 DIAGNOSIS — Z1211 Encounter for screening for malignant neoplasm of colon: Secondary | ICD-10-CM | POA: Diagnosis not present

## 2019-01-23 DIAGNOSIS — K2950 Unspecified chronic gastritis without bleeding: Secondary | ICD-10-CM | POA: Insufficient documentation

## 2019-01-23 DIAGNOSIS — Z79899 Other long term (current) drug therapy: Secondary | ICD-10-CM | POA: Insufficient documentation

## 2019-01-23 DIAGNOSIS — K5730 Diverticulosis of large intestine without perforation or abscess without bleeding: Secondary | ICD-10-CM | POA: Insufficient documentation

## 2019-01-23 DIAGNOSIS — R1310 Dysphagia, unspecified: Secondary | ICD-10-CM

## 2019-01-23 HISTORY — PX: SAVORY DILATION: SHX5439

## 2019-01-23 HISTORY — PX: ESOPHAGOGASTRODUODENOSCOPY (EGD) WITH PROPOFOL: SHX5813

## 2019-01-23 HISTORY — PX: BIOPSY: SHX5522

## 2019-01-23 HISTORY — PX: COLONOSCOPY WITH PROPOFOL: SHX5780

## 2019-01-23 SURGERY — COLONOSCOPY WITH PROPOFOL
Anesthesia: General

## 2019-01-23 SURGERY — COLONOSCOPY WITH PROPOFOL
Anesthesia: Monitor Anesthesia Care

## 2019-01-23 MED ORDER — PROPOFOL 10 MG/ML IV BOLUS
INTRAVENOUS | Status: DC | PRN
  Administered 2019-01-23: 20 mg via INTRAVENOUS
  Administered 2019-01-23: 30 mg via INTRAVENOUS

## 2019-01-23 MED ORDER — PROMETHAZINE HCL 25 MG/ML IJ SOLN
6.2500 mg | INTRAMUSCULAR | Status: DC | PRN
  Administered 2019-01-23: 09:00:00 6.25 mg via INTRAVENOUS

## 2019-01-23 MED ORDER — PROPOFOL 10 MG/ML IV BOLUS
INTRAVENOUS | Status: AC
  Filled 2019-01-23: qty 100

## 2019-01-23 MED ORDER — LACTATED RINGERS IV SOLN: INTRAVENOUS | Status: DC

## 2019-01-23 MED ORDER — SODIUM CHLORIDE FLUSH 0.9 % IV SOLN
INTRAVENOUS | Status: AC
  Filled 2019-01-23: qty 10

## 2019-01-23 MED ORDER — GLYCOPYRROLATE 0.2 MG/ML IJ SOLN
INTRAMUSCULAR | Status: DC | PRN
  Administered 2019-01-23: .2 mg via INTRAVENOUS

## 2019-01-23 MED ORDER — PROMETHAZINE HCL 25 MG/ML IJ SOLN
INTRAMUSCULAR | Status: AC
  Filled 2019-01-23: qty 1

## 2019-01-23 MED ORDER — HYDROMORPHONE HCL 1 MG/ML IJ SOLN: 0.2500 mg | INTRAMUSCULAR | Status: DC | PRN

## 2019-01-23 MED ORDER — GLYCOPYRROLATE PF 0.2 MG/ML IJ SOSY
PREFILLED_SYRINGE | INTRAMUSCULAR | Status: AC
  Filled 2019-01-23: qty 1

## 2019-01-23 MED ORDER — HYDROCODONE-ACETAMINOPHEN 7.5-325 MG PO TABS: 1.0000 | ORAL_TABLET | Freq: Once | ORAL | Status: DC | PRN

## 2019-01-23 MED ORDER — KETAMINE HCL 10 MG/ML IJ SOLN
INTRAMUSCULAR | Status: DC | PRN
  Administered 2019-01-23: 10 mg via INTRAVENOUS
  Administered 2019-01-23: 20 mg via INTRAVENOUS

## 2019-01-23 MED ORDER — KETAMINE HCL 50 MG/5ML IJ SOSY
PREFILLED_SYRINGE | INTRAMUSCULAR | Status: AC
  Filled 2019-01-23: qty 5

## 2019-01-23 MED ORDER — MIDAZOLAM HCL 2 MG/2ML IJ SOLN: 0.5000 mg | Freq: Once | INTRAMUSCULAR | Status: DC | PRN

## 2019-01-23 MED ORDER — PROPOFOL 500 MG/50ML IV EMUL
INTRAVENOUS | Status: DC | PRN
  Administered 2019-01-23 (×2): 150 ug/kg/min via INTRAVENOUS
  Administered 2019-01-23: 175 ug/kg/min via INTRAVENOUS

## 2019-01-23 MED ORDER — LIDOCAINE HCL (CARDIAC) PF 100 MG/5ML IV SOSY
PREFILLED_SYRINGE | INTRAVENOUS | Status: DC | PRN
  Administered 2019-01-23: 40 mg via INTRAVENOUS

## 2019-01-23 MED ORDER — LIDOCAINE 2% (20 MG/ML) 5 ML SYRINGE
INTRAMUSCULAR | Status: AC
  Filled 2019-01-23: qty 10

## 2019-01-23 NOTE — Anesthesia Preprocedure Evaluation (Signed)
Anesthesia Evaluation  Patient identified by MRN, date of birth, ID band Patient awake  General Assessment Comment:PONV once with BTL ~2005  Reviewed: Allergy & Precautions, NPO status , Patient's Chart, lab work & pertinent test results  History of Anesthesia Complications (+) PONV  Airway Mallampati: II  TM Distance: >3 FB Neck ROM: Full    Dental no notable dental hx. (+) Teeth Intact   Pulmonary neg pulmonary ROS, Current Smoker and Patient abstained from smoking.,    Pulmonary exam normal breath sounds clear to auscultation       Cardiovascular Exercise Tolerance: Good negative cardio ROS Normal cardiovascular examI Rhythm:Regular Rate:Normal     Neuro/Psych Anxiety Depression Bipolar Disorder negative neurological ROS  negative psych ROS   GI/Hepatic Neg liver ROS, GERD  Medicated and Controlled,  Endo/Other  negative endocrine ROS  Renal/GU negative Renal ROS  negative genitourinary   Musculoskeletal negative musculoskeletal ROS (+)   Abdominal   Peds negative pediatric ROS (+)  Hematology negative hematology ROS (+)   Anesthesia Other Findings   Reproductive/Obstetrics negative OB ROS                             Anesthesia Physical Anesthesia Plan  ASA: II  Anesthesia Plan: General   Post-op Pain Management:    Induction: Intravenous  PONV Risk Score and Plan: 3 and TIVA, Propofol infusion, Ondansetron, Treatment may vary due to age or medical condition and Midazolam  Airway Management Planned: Nasal Cannula and Simple Face Mask  Additional Equipment:   Intra-op Plan:   Post-operative Plan:   Informed Consent: I have reviewed the patients History and Physical, chart, labs and discussed the procedure including the risks, benefits and alternatives for the proposed anesthesia with the patient or authorized representative who has indicated his/her understanding and  acceptance.     Dental advisory given  Plan Discussed with: CRNA  Anesthesia Plan Comments: (Plan Full PPE use  Plan GA with GETA as needed d/w pt -WTP with same after Q&A)        Anesthesia Quick Evaluation

## 2019-01-23 NOTE — Op Note (Addendum)
[facility] Patient Name: Anne Logan Procedure Date: 01/23/2019 7:15 AM MRN: [id number] Date of Birth: 06/21/1965 Attending MD: Jonette Eva MD, MD CSN: [id number] Age: 54 Admit Type: Outpatient Procedure:                Colonoscopy, SCREENING Indications:              Screening for colorectal malignant neoplasm. TOOK                            MIRALAX PREP. Providers:                Jonette Eva MD, MD, Criselda Peaches. Patsy Lager, RN,                            Burke Keels, Technician Referring MD:             Vilinda Blanks. Luking Medicines:                Propofol per Anesthesia Complications:            No immediate complications. Estimated Blood Loss:     Estimated blood loss: none. Procedure:                Pre-Anesthesia Assessment:                           - Prior to the procedure, a History and Physical                            was performed, and patient medications and                            allergies were reviewed. The patient's tolerance of                            previous anesthesia was also reviewed. The risks                            and benefits of the procedure and the sedation                            options and risks were discussed with the patient.                            All questions were answered, and informed consent                            was obtained. Prior Anticoagulants: The patient has                            taken no previous anticoagulant or antiplatelet                            agents except for aspirin. ASA Grade Assessment: II                            -  A patient with mild systemic disease. After                            reviewing the risks and benefits, the patient was                            deemed in satisfactory condition to undergo the                            procedure. After obtaining informed consent, the                            colonoscope was passed under direct vision.                             Throughout the procedure, the patient's blood                            pressure, pulse, and oxygen saturations were                            monitored continuously. The [id number]-[id number] ([id number])                            scope was introduced through the anus and advanced                            to the the cecum, identified by appendiceal orifice                            and ileocecal valve. The colonoscopy was somewhat                            difficult due to poor endoscopic visualization and                            a tortuous colon. Successful completion of the                            procedure was aided by straightening and shortening                            the scope to obtain bowel loop reduction. The                            quality of the bowel preparation was adequate to                            identify polyps. The ileocecal valve, appendiceal                            orifice, and rectum were photographed. The patient  tolerated the procedure fairly well. Scope In: 7:44:55 AM Scope Out: 8:20:50 AM Scope Withdrawal Time: 0 hours 29 minutes 31 seconds  Total Procedure Duration: 0 hours 35 minutes 55 seconds  Findings:      The recto-sigmoid colon, sigmoid colon, descending colon and splenic       flexure revealed grossly excessive looping.      Internal hemorrhoids were found. The hemorrhoids were moderate.      Multiple small and large-mouthed diverticula were found in the       recto-sigmoid colon and sigmoid colon.      -GOOD PREP WITH EXTENSIVE LAVAGE. POLYPS < 3 MM WOULD BE MISSED. Impression:               - There was significant looping of the colon.                           - Internal hemorrhoids.                           - The examination was otherwise normal.                           - MODERATE DIVERTICULOSIS WITH RESTRICTED MOBILITY                            OF THE RECTOSIGMOID COLON                            . Moderate Sedation:      Per Anesthesia Care Recommendation:           - Patient has a contact number available for                            emergencies. The signs and symptoms of potential                            delayed complications were discussed with the                            patient. Return to normal activities tomorrow.                            Written discharge instructions were provided to the                            patient.                           - High fiber diet.                           - Continue present medications.                           - Repeat colonoscopy in 5 years for surveillance  DUE TO PREP QUALITY. NEEDS SUPREP OR EQUIVALENT.                           - Return to GI office in 4 months. Procedure Code(s):        --- Professional ---                           [phone], Colonoscopy, flexible; diagnostic, including                            collection of specimen(s) by brushing or washing,                            when performed (separate procedure) Diagnosis Code(s):        --- Professional ---                           K64.8, Other hemorrhoids CPT copyright 2019 American Medical Association. All rights reserved. The codes documented in this report are preliminary and upon coder review may  be revised to meet current compliance requirements. Jonette Eva, MD Jonette Eva MD, MD 01/23/2019 8:54:49 AM This report has been signed electronically. Number of Addenda: 0

## 2019-01-23 NOTE — Anesthesia Procedure Notes (Signed)
Procedure Name: General with mask airway Date/Time: 01/23/2019 7:33 AM Performed by: Pernell Dupre, Xzavien Harada A, CRNA Pre-anesthesia Checklist: Timeout performed, Patient being monitored, Suction available, Emergency Drugs available and Patient identified Oxygen Delivery Method: Non-rebreather mask

## 2019-01-23 NOTE — H&P (Addendum)
Primary Care Physician:  Merlyn Albert, MD Primary Gastroenterologist:  Dr. Darrick Penna  Pre-Procedure History & Physical: HPI:  Anne Logan is a 54 y.o. female here for SCREENING/WEIGHT LOSS/NAUSEA.  Past Medical History:  Diagnosis Date  . Anxiety   . Depression   . GERD (gastroesophageal reflux disease)     Past Surgical History:  Procedure Laterality Date  . TUBAL LIGATION Bilateral 2005    Prior to Admission medications   Medication Sig Start Date End Date Taking? Authorizing Provider  aspirin-acetaminophen-caffeine (EXCEDRIN MIGRAINE) [phone] MG tablet Take 2 tablets by mouth every 8 (eight) hours as needed for headache.    Yes [provider]  divalproex (DEPAKOTE ER) 500 MG 24 hr tablet Take 2 tablets (1,000 mg total) by mouth at bedtime. 01/17/19  Yes Myrlene Broker, MD  esomeprazole (NEXIUM) 40 MG capsule 1 PO 30 MINUTES PRIOR TO MEALS BID for 3 mos then 1 po qd Patient taking differently: Take 40 mg by mouth 2 (two) times daily before a meal. 1 PO 30 MINUTES PRIOR TO MEALS BID for 3 mos then 1 po qd 12/18/18  Yes Mckynzi Cammon L, MD  metoCLOPramide (REGLAN) 5 MG tablet 1 po 30 minutes prior to MEALS THREE TIMES A DAY AND AT BEDTIME. Patient taking differently: Take 5 mg by mouth 3 (three) times daily before meals.  12/18/18  Yes Adaly Puder, Darleene Cleaver, MD  Multiple Vitamins-Minerals (ONE-A-DAY MENOPAUSE FORMULA PO) Take 1 tablet by mouth daily.   Yes [provider]  QUEtiapine (SEROQUEL) 200 MG tablet Take 1 tablet (200 mg total) by mouth at bedtime. 01/17/19  Yes Myrlene Broker, MD  rosuvastatin (CRESTOR) 5 MG tablet Take 1 tablet (5 mg total) by mouth daily. For cholesterol 11/29/18  Yes Campbell Riches, NP  ALPRAZolam Prudy Feeler) 1 MG tablet Take 1 tablet (1 mg total) by mouth 3 (three) times daily as needed for anxiety. 01/17/19 01/17/20  Myrlene Broker, MD  tolterodine (DETROL LA) 2 MG 24 hr capsule TAKE 1 CAPSULE BY MOUTH EVERY DAY Patient not  taking: Reported on 01/03/2019 09/18/18   Cheral Marker, CNM    Allergies as of 12/06/2018  . (No Known Allergies)    Family History  Problem Relation Age of Onset  . Depression Mother   . Cancer Mother        lymphoma  . Alcohol abuse Father   . Drug abuse Son   . Colon cancer Neg Hx     Social History   Socioeconomic History  . Marital status: Married    Spouse name: Not on file  . Number of children: Not on file  . Years of education: Not on file  . Highest education level: Not on file  Occupational History  . Not on file  Tobacco Use  . Smoking status: Current Every Day Smoker    Packs/day: 0.50    Years: 20.00    Pack years: 10.00    Types: Cigarettes  . Smokeless tobacco: Never Used  . Tobacco comment: smokes 10 cig daily  Substance and Sexual Activity  . Alcohol use: No    Alcohol/week: 0.0 standard drinks  . Drug use: No  . Sexual activity: Not Currently    Partners: Male    Birth control/protection: Surgical    Comment: tubal  Other Topics Concern  . Not on file  Social History Narrative  . Not on file   Social Determinants of Health   Financial Resource Strain:   .  Difficulty of Paying Living Expenses: Not on file  Food Insecurity:   . Worried About Charity fundraiser in the Last Year: Not on file  . Ran Out of Food in the Last Year: Not on file  Transportation Needs:   . Lack of Transportation (Medical): Not on file  . Lack of Transportation (Non-Medical): Not on file  Physical Activity:   . Days of Exercise per Week: Not on file  . Minutes of Exercise per Session: Not on file  Stress:   . Feeling of Stress : Not on file  Social Connections:   . Frequency of Communication with Friends and Family: Not on file  . Frequency of Social Gatherings with Friends and Family: Not on file  . Attends Religious Services: Not on file  . Active Member of Clubs or Organizations: Not on file  . Attends Archivist Meetings: Not on file  .  Marital Status: Not on file  Intimate Partner Violence:   . Fear of Current or Ex-Partner: Not on file  . Emotionally Abused: Not on file  . Physically Abused: Not on file  . Sexually Abused: Not on file    Review of Systems: See HPI, otherwise negative ROS   Physical Exam: BP 107/74   Pulse 86   Temp 98.3 F (36.8 C) (Oral)   Resp 14   Ht 5\' 4"  (1.626 m)   Wt 55.3 kg   LMP 04/13/2015   SpO2 94%   BMI 20.94 kg/m  General:   Alert,  pleasant and cooperative in NAD Head:  Normocephalic and atraumatic. Neck:  Supple; Lungs:  Clear throughout to auscultation.    Heart:  Regular rate and rhythm. Abdomen:  Soft, nontender and nondistended. Normal bowel sounds, without guarding, and without rebound.   Neurologic:  Alert and  oriented x4;  grossly normal neurologically.  Impression/Plan:    SCREENING/WEIGHT LOSS/NAUSEA  PLAN: 1. EGD/?DIL/TCS TODAY. DISCUSSED PROCEDURE, BENEFITS, & RISKS: < 1% chance of medication reaction, bleeding, perforation, ASPIRATION, or rupture of spleen/liver requiring surgery to fix it and missed polyps < 1 cm 10-20% of the time.

## 2019-01-23 NOTE — Discharge Instructions (Signed)
You have internal hemorrhoids and diverticulosis IN YOUR LEFT COLON. I dilated your esophagus DUE TO A STRICTURE. You have gastritis. I biopsied your stomach.   FOLLOW A HIGH FIBER/LOW FAT DIET. AVOID ITEMS THAT CAUSE BLOATING. SEE INFO BELOW.   CONTINUE NEXIUM. TAKE 30 MINUTES PRIOR TO MEALS ONCE OR TWICE DAILY.  CONTINUE REGLAN. SIDE EFFECTS OF REGLAN INCLUDE  AGITATION, DRAINAGE FROM BREAST, INVOLUNTARY MOVEMENT OF HEAD, NECK AND TORSO, OR CHANGES IN VISION.  USE PREPARATION H FOUR TIMES  A DAY IF NEEDED TO RELIEVE RECTAL PAIN/PRESSURE/BLEEDING.   YOUR BIOPSY RESULTS WILL BE BACK IN 5 BUSINESS DAYS.  FOLLOW UP IN 4 MOS.  Next colonoscopy in 5 years.  ENDOSCOPY Care After Read the instructions outlined below and refer to this sheet in the next week. These discharge instructions provide you with general information on caring for yourself after you leave the hospital. While your treatment has been planned according to the most current medical practices available, unavoidable complications occasionally occur. If you have any problems or questions after discharge, call DR. FIELDS, [phone].  ACTIVITY  You may resume your regular activity, but move at a slower pace for the next 24 hours.   Take frequent rest periods for the next 24 hours.   Walking will help get rid of the air and reduce the bloated feeling in your belly (abdomen).   No driving for 24 hours (because of the medicine (anesthesia) used during the test).   You may shower.   Do not sign any important legal documents or operate any machinery for 24 hours (because of the anesthesia used during the test).    NUTRITION  Drink plenty of fluids.   You may resume your normal diet as instructed by your doctor.   Begin with a light meal and progress to your normal diet. Heavy or fried foods are harder to digest and may make you feel sick to your stomach (nauseated).   Avoid alcoholic beverages for 24 hours or as  instructed.    MEDICATIONS  You may resume your normal medications.   WHAT YOU CAN EXPECT TODAY  Some feelings of bloating in the abdomen.   Passage of more gas than usual.   Spotting of blood in your stool or on the toilet paper  .  IF YOU HAD POLYPS REMOVED DURING THE ENDOSCOPY:  Eat a soft diet IF YOU HAVE NAUSEA, BLOATING, ABDOMINAL PAIN, OR VOMITING.    FINDING OUT THE RESULTS OF YOUR TEST Not all test results are available during your visit. DR. Darrick Penna WILL CALL YOU WITHIN 14 DAYS OF YOUR PROCEDUE WITH YOUR RESULTS. Do not assume everything is normal if you have not heard from DR. FIELDS, CALL HER OFFICE AT [phone].  SEEK IMMEDIATE MEDICAL ATTENTION AND CALL THE OFFICE: [phone] IF:  You have more than a spotting of blood in your stool.   Your belly is swollen (abdominal distention).   You are nauseated or vomiting.   You have a temperature over 101F.   You have abdominal pain or discomfort that is severe or gets worse throughout the day.  Gastritis  Gastritis is an inflammation (the body's way of reacting to injury and/or infection) of the stomach. It is often caused by viral or bacterial (germ) infections. It can also be caused BY ASPIRIN, BC/GOODY POWDER'S, (IBUPROFEN) MOTRIN, OR ALEVE (NAPROXEN), chemicals (including alcohol), SPICY FOODS, and medications. This illness may be associated with generalized malaise (feeling tired, not well), UPPER ABDOMINAL STOMACH cramps, and fever. One  common bacterial cause of gastritis is an organism known as H. Pylori. This can be treated with antibiotics.   High-Fiber Diet A high-fiber diet changes your normal diet to include more whole grains, legumes, fruits, and vegetables. Changes in the diet involve replacing refined carbohydrates with unrefined foods. The calorie level of the diet is essentially unchanged. The Dietary Reference Intake (recommended amount) for adult males is 38 grams per day. For adult females, it  is 25 grams per day. Pregnant and lactating women should consume 28 grams of fiber per day. Fiber is the intact part of a plant that is not broken down during digestion. Functional fiber is fiber that has been isolated from the plant to provide a beneficial effect in the body.  PURPOSE Increase stool bulk.  Ease and regulate bowel movements.  Lower cholesterol.  REDUCE RISK OF COLON CANCER  INDICATIONS THAT YOU NEED MORE FIBER Constipation and hemorrhoids.  Uncomplicated diverticulosis (intestine condition) and irritable bowel syndrome.  Weight management.  As a protective measure against hardening of the arteries (atherosclerosis), diabetes, and cancer.   GUIDELINES FOR INCREASING FIBER IN THE DIET Start adding fiber to the diet slowly. A gradual increase of about 5 more grams (2 servings of most fruits or vegetables) per day is best. Too rapid an increase in fiber may result in constipation, flatulence, and bloating.  Drink enough water and fluids to keep your urine clear or pale yellow. Water, juice, or caffeine-free drinks are recommended. Not drinking enough fluid may cause constipation.  Eat a variety of high-fiber foods rather than one type of fiber.  Try to increase your intake of fiber through using high-fiber foods rather than fiber pills or supplements that contain small amounts of fiber.  The goal is to change the types of food eaten. Do not supplement your present diet with high-fiber foods, but replace foods in your present diet.     Diverticulosis Diverticulosis is a common condition that develops when small pouches (diverticula) form in the wall of the colon. The risk of diverticulosis increases with age. It happens more often in people who eat a low-fiber diet. Most individuals with diverticulosis have no symptoms. Those individuals with symptoms usually experience belly (abdominal) pain, constipation, or loose stools (diarrhea).  HOME CARE INSTRUCTIONS  Increase the  amount of fiber in your diet as directed by your caregiver or dietician. This may reduce symptoms of diverticulosis.   Drink at least 6 to 8 glasses of water each day to prevent constipation.   Try not to strain when you have a bowel movement.   Avoiding nuts and seeds to prevent complications is NOT NECESSARY.    SEEK IMMEDIATE MEDICAL CARE IF:  You develop increasing pain or severe bloating.   You have an oral temperature above 101F.   You develop vomiting or bowel movements that are bloody or black.

## 2019-01-23 NOTE — Anesthesia Postprocedure Evaluation (Signed)
Anesthesia Post Note  Patient: Anne Logan  Procedure(s) Performed: COLONOSCOPY WITH PROPOFOL (N/A ) ESOPHAGOGASTRODUODENOSCOPY (EGD) WITH PROPOFOL (N/A ) SAVORY DILATION (N/A ) BIOPSY  Patient location during evaluation: PACU Anesthesia Type: General Level of consciousness: oriented and awake Pain management: pain level controlled Vital Signs Assessment: post-procedure vital signs reviewed and stable Respiratory status: spontaneous breathing Cardiovascular status: stable Postop Assessment: no apparent nausea or vomiting Anesthetic complications: no     Last Vitals:  Vitals:   01/23/19 0730 01/23/19 0845  BP: 115/63   Pulse:    Resp:  17  Temp:    SpO2:  90%    Last Pain:  Vitals:   01/23/19 0737  TempSrc:   PainSc: 0-No pain                 Raymie Giammarco A

## 2019-01-23 NOTE — Op Note (Signed)
[facility] Patient Name: Anne Logan Procedure Date: 01/23/2019 8:24 AM MRN: [id number] Date of Birth: 08-Jun-1965 Attending MD: Barney Drain MD, MD CSN: [id number] Age: 54 Admit Type: Outpatient Procedure:                Upper GI endoscopy WITH COLD FORCEPS                            BIOPSY/ESOPHAGEAL DILATION Indications:              Epigastric abdominal pain, Dyspepsia Providers:                Barney Drain MD, MD, Gwenlyn Fudge RN, RN, Randa Spike, Technician Referring MD:             Grace Bushy. Luking Medicines:                Propofol per Anesthesia Complications:            No immediate complications. Estimated Blood Loss:     Estimated blood loss was minimal. Procedure:                Pre-Anesthesia Assessment:                           - Prior to the procedure, a History and Physical                            was performed, and patient medications and                            allergies were reviewed. The patient's tolerance of                            previous anesthesia was also reviewed. The risks                            and benefits of the procedure and the sedation                            options and risks were discussed with the patient.                            All questions were answered, and informed consent                            was obtained. Prior Anticoagulants: The patient has                            taken no previous anticoagulant or antiplatelet                            agents except for aspirin. ASA Grade Assessment: II                            -  A patient with mild systemic disease. After                            reviewing the risks and benefits, the patient was                            deemed in satisfactory condition to undergo the                            procedure. After obtaining informed consent, the                            endoscope was passed under direct vision.         Throughout the procedure, the patient's blood                            pressure, pulse, and oxygen saturations were                            monitored continuously. The [id number]-[id number] ([id number])                            scope was introduced through the mouth, and                            advanced to the second part of duodenum. The upper                            GI endoscopy was accomplished without difficulty.                            The patient tolerated the procedure well. Scope In: 8:26:54 AM Scope Out: 8:37:03 AM Total Procedure Duration: 0 hours 10 minutes 9 seconds  Findings:      One benign-appearing, intrinsic moderate (circumferential scarring or       stenosis; an endoscope may pass) stenosis was found. This stenosis       measured 1.4 cm (inner diameter). The stenosis was traversed. A       guidewire was placed and the scope was withdrawn. Dilation was performed       with a Savary dilator with mild resistance at 16 mm and 17 mm.      Localized mild inflammation characterized by congestion (edema) and       erythema was found in the gastric antrum.       Biopsies(2;BODY,1:INCISURA,2:ANTRUM) were taken with a cold forceps for       Helicobacter pylori testing.      The duodenal bulb was normal. Biopsies(2) for histology were taken with       a cold forceps for evaluation of celiac disease.      The second portion of the duodenum was normal. Biopsies(4) for histology       were taken with a cold forceps for evaluation of celiac disease. Impression:               - NAUSEA/EPIGASTRIC PAIN DUE TO Benign-appearing  esophageal STRICTURE INDICATING UNCONTROLLED GERD.                           - MILD Gastritis. Biopsied. Moderate Sedation:      Per Anesthesia Care Recommendation:           - Patient has a contact number available for                            emergencies. The signs and symptoms of potential                            delayed  complications were discussed with the                            patient. Return to normal activities tomorrow.                            Written discharge instructions were provided to the                            patient.                           - High fiber diet and low fat diet.                           - Continue present medications. CONTINUE NEXIUM BID                            AND REGLAN QACHS.                           - Await pathology results.                           - Return to GI office in 4 months. Procedure Code(s):        --- Professional ---                           [phone], Esophagogastroduodenoscopy, flexible,                            transoral; with insertion of guide wire followed by                            passage of dilator(s) through esophagus over guide                            wire                           [id number], 59, Esophagogastroduodenoscopy, flexible,                            transoral; with biopsy, single or multiple Diagnosis Code(s):        --- Professional ---  K22.2, Esophageal obstruction                           K29.70, Gastritis, unspecified, without bleeding                           R10.13, Epigastric pain CPT copyright 2019 American Medical Association. All rights reserved. The codes documented in this report are preliminary and upon coder review may  be revised to meet current compliance requirements. Jonette Eva, MD Jonette Eva MD, MD 01/23/2019 8:59:20 AM This report has been signed electronically. Number of Addenda: 0

## 2019-01-23 NOTE — Transfer of Care (Signed)
Immediate Anesthesia Transfer of Care Note  Patient: Anne Logan  Procedure(s) Performed: COLONOSCOPY WITH PROPOFOL (N/A ) ESOPHAGOGASTRODUODENOSCOPY (EGD) WITH PROPOFOL (N/A ) SAVORY DILATION (N/A ) BIOPSY  Patient Location: PACU  Anesthesia Type:General  Level of Consciousness: awake, oriented and patient cooperative  Airway & Oxygen Therapy: Patient Spontanous Breathing  Post-op Assessment: Report given to RN and Post -op Vital signs reviewed and stable  Post vital signs: Reviewed and stable  Last Vitals:  Vitals Value Taken Time  BP 117/83 01/23/19 0846  Temp    Pulse 93 01/23/19 0848  Resp 20 01/23/19 0848  SpO2 95 % 01/23/19 0848  Vitals shown include unvalidated device data.  Last Pain:  Vitals:   01/23/19 0737  TempSrc:   PainSc: 0-No pain      Patients Stated Pain Goal: 5 (01/23/19 0659)  Complications: No apparent anesthesia complications

## 2019-01-24 ENCOUNTER — Other Ambulatory Visit: Payer: Self-pay

## 2019-01-24 LAB — SURGICAL PATHOLOGY

## 2019-01-25 ENCOUNTER — Encounter: Payer: Self-pay | Admitting: Gastroenterology

## 2019-01-26 ENCOUNTER — Other Ambulatory Visit: Payer: Self-pay | Admitting: Nurse Practitioner

## 2019-01-27 ENCOUNTER — Other Ambulatory Visit (HOSPITAL_COMMUNITY): Payer: Self-pay | Admitting: Psychiatry

## 2019-02-08 LAB — HEPATIC FUNCTION PANEL
ALT: 11 IU/L (ref 0–32)
AST: 16 IU/L (ref 0–40)
Albumin: 4.5 g/dL (ref 3.8–4.9)
Alkaline Phosphatase: 54 IU/L (ref 39–117)
Bilirubin Total: <0.2 mg/dL (ref 0.0–1.2)
Bilirubin, Direct: 0.06 mg/dL (ref 0.00–0.40)
Total Protein: 6.4 g/dL (ref 6.0–8.5)

## 2019-02-08 LAB — LIPID PANEL
Chol/HDL Ratio: 3.8 ratio (ref 0.0–4.4)
Cholesterol, Total: 184 mg/dL (ref 100–199)
HDL: 49 mg/dL (ref >39)
LDL Chol Calc (NIH): 105 mg/dL — ABNORMAL HIGH (ref 0–99)
Triglycerides: 170 mg/dL — ABNORMAL HIGH (ref 0–149)
VLDL Cholesterol Cal: 30 mg/dL (ref 5–40)

## 2019-02-12 ENCOUNTER — Telehealth: Payer: Self-pay | Admitting: Gastroenterology

## 2019-02-12 NOTE — Telephone Encounter (Signed)
Reglan can cause some visual disturbance. Please have your stop Reglan for now to see if symptoms clear up. If not she should see her PCP or eye doctor.

## 2019-02-12 NOTE — Telephone Encounter (Signed)
LMOM to call.

## 2019-02-12 NOTE — Telephone Encounter (Signed)
PT is aware of the recommendations.  

## 2019-02-12 NOTE — Telephone Encounter (Signed)
Pt called to let us know that the Reglan was effecting her vision, but the Nexium was working. [phone]

## 2019-02-12 NOTE — Telephone Encounter (Signed)
Pt said she has been taking the Reglan bid. She said it is affecting her vision. Said she was told by Dr. Darrick Penna that is one of the worst side effects of Reglan. Forwarding to Tana Coast, PA who saw pt in the office last to advise in Dr. Evelina Dun absence.

## 2019-02-21 ENCOUNTER — Encounter: Payer: Self-pay | Admitting: Family Medicine

## 2019-04-16 ENCOUNTER — Other Ambulatory Visit (HOSPITAL_COMMUNITY): Payer: Self-pay | Admitting: Psychiatry

## 2019-04-16 ENCOUNTER — Telehealth (HOSPITAL_COMMUNITY): Payer: Self-pay | Admitting: *Deleted

## 2019-04-16 MED ORDER — ALPRAZOLAM 1 MG PO TABS: 1.0000 mg | ORAL_TABLET | Freq: Three times a day (TID) | ORAL | 2 refills | Status: DC | PRN

## 2019-04-16 NOTE — Telephone Encounter (Signed)
sent 

## 2019-04-16 NOTE — Telephone Encounter (Signed)
patients called requested refill stated she's been out of her ALPRAZolam Prudy Feeler) 1 MG tablet since Friday. Upcoming appointment 04/18/2019

## 2019-04-16 NOTE — Telephone Encounter (Signed)
Sent. Please make f/u appt

## 2019-04-16 NOTE — Telephone Encounter (Signed)
patients called requested refill stated she's been out of her ALPRAZolam (XANAX) 1 MG tablet since Friday. Upcoming appointment 04/18/2019 

## 2019-04-16 NOTE — Telephone Encounter (Signed)
[facility] NO LONGER CARRIES XANAX  PLEASE RESEND BACK TO [facility] AS PREVIOUS USED

## 2019-04-18 ENCOUNTER — Ambulatory Visit (INDEPENDENT_AMBULATORY_CARE_PROVIDER_SITE_OTHER): Payer: PRIVATE HEALTH INSURANCE | Admitting: Psychiatry

## 2019-04-18 ENCOUNTER — Other Ambulatory Visit: Payer: Self-pay

## 2019-04-18 ENCOUNTER — Encounter (HOSPITAL_COMMUNITY): Payer: Self-pay | Admitting: Psychiatry

## 2019-04-18 DIAGNOSIS — F3162 Bipolar disorder, current episode mixed, moderate: Secondary | ICD-10-CM

## 2019-04-18 DIAGNOSIS — F17210 Nicotine dependence, cigarettes, uncomplicated: Secondary | ICD-10-CM

## 2019-04-18 DIAGNOSIS — F419 Anxiety disorder, unspecified: Secondary | ICD-10-CM

## 2019-04-18 DIAGNOSIS — G4700 Insomnia, unspecified: Secondary | ICD-10-CM | POA: Diagnosis not present

## 2019-04-18 MED ORDER — DIVALPROEX SODIUM ER 500 MG PO TB24: 1000.0000 mg | ORAL_TABLET | Freq: Every day | ORAL | 2 refills | Status: DC

## 2019-04-18 MED ORDER — ALPRAZOLAM 1 MG PO TABS: 1.0000 mg | ORAL_TABLET | Freq: Three times a day (TID) | ORAL | 2 refills | Status: DC | PRN

## 2019-04-18 MED ORDER — QUETIAPINE FUMARATE 200 MG PO TABS: 200.0000 mg | ORAL_TABLET | Freq: Every day | ORAL | 2 refills | Status: DC

## 2019-04-18 NOTE — Progress Notes (Signed)
Virtual Visit via Video Note  I connected with Anne Logan on 04/18/19 at  4:00 PM EDT by a video enabled telemedicine application and verified that I am speaking with the correct person using two identifiers.   I discussed the limitations of evaluation and management by telemedicine and the availability of in person appointments. The patient expressed understanding and agreed to proceed   I discussed the assessment and treatment plan with the patient. The patient was provided an opportunity to ask questions and all were answered. The patient agreed with the plan and demonstrated an understanding of the instructions.   The patient was advised to call back or seek an in-person evaluation if the symptoms worsen or if the condition fails to improve as anticipated.  I provided 15 minutes of non-face-to-face time during this encounter.   Levonne Spiller, MD  [facility] MD/PA/NP OP Progress Note  04/18/2019 4:10 PM Anne Logan  MRN:  [id number]  Chief Complaint:  Chief Complaint    Anxiety; Depression; Follow-up     HPI: This patient is a 54 year old married white female who lives with her husband in [place].  She is an Counsellor for [employer].  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  The patient returns for follow-up of bipolar disorder and anxiety.  The patient was last seen 3 months ago.  Overall she is doing fairly well.  She ran out of her Xanax for a couple of days and this was difficult for her but now she is gotten it back and is doing fine.  She is sleeping fairly well now with the increase Seroquel.  She denies depressed mood severe anxiety panic attacks or suicidal ideation.  She seems to be stable on her current regimen.  She denies any manic symptoms. Visit Diagnosis:    ICD-10-CM   1. Bipolar 1 disorder, mixed, moderate (HCC)  F31.62     Past Psychiatric History: Hospitalization many years ago.  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 Anxiety   . Depression   . GERD (gastroesophageal reflux disease)     Past Surgical History:  Procedure Laterality Date  . BIOPSY  01/23/2019   Procedure: BIOPSY;  Surgeon: Danie Binder, MD;  [facility];  Service: Endoscopy;;  duodenum gastric  . COLONOSCOPY WITH PROPOFOL N/A 01/23/2019   Procedure: COLONOSCOPY WITH PROPOFOL;  Surgeon: Danie Binder, MD;  Location: [facility];  Service: Endoscopy;  Laterality: N/A;  7:30am  . ESOPHAGOGASTRODUODENOSCOPY (EGD) WITH PROPOFOL N/A 01/23/2019   Procedure: ESOPHAGOGASTRODUODENOSCOPY (EGD) WITH PROPOFOL;  Surgeon: Danie Binder, MD;  Location: [facility];  Service: Endoscopy;  Laterality: N/A;  . SAVORY DILATION N/A 01/23/2019   Procedure: SAVORY DILATION;  Surgeon: Danie Binder, MD;  Location: [facility];  Service: Endoscopy;  Laterality: N/A;  . TUBAL LIGATION Bilateral 2005    Family Psychiatric History: see below  Family History:  Family History  Problem Relation Age of Onset  . Depression Mother   . Cancer Mother        lymphoma  . Alcohol abuse Father   . Drug abuse Son   . Colon cancer Neg Hx     Social History:  Social History   Socioeconomic History  . Marital status: Married    Spouse name: Not on file  . Number of children: Not on file  . Years of education:  Not on file  . Highest education level: Not on file  Occupational History  . Not on file  Tobacco Use  . Smoking status: Current Every Day Smoker    Packs/day: 0.50    Years: 20.00    Pack years: 10.00    Types: Cigarettes  . Smokeless tobacco: Never Used  . Tobacco comment: smokes 10 cig daily  Substance and Sexual Activity  . Alcohol use: No    Alcohol/week: 0.0 standard drinks  . Drug use: No  . Sexual activity: Not Currently    Partners: Male    Birth control/protection: Surgical    Comment: tubal  Other Topics Concern  . Not on file  Social History Narrative  . Not on  file   Social Determinants of Health   Financial Resource Strain:   . Difficulty of Paying Living Expenses:   Food Insecurity:   . Worried About Programme researcher, broadcasting/film/video in the Last Year:   . Barista in the Last Year:   Transportation Needs:   . Freight forwarder (Medical):   Marland Kitchen Lack of Transportation (Non-Medical):   Physical Activity:   . Days of Exercise per Week:   . Minutes of Exercise per Session:   Stress:   . Feeling of Stress :   Social Connections:   . Frequency of Communication with Friends and Family:   . Frequency of Social Gatherings with Friends and Family:   . Attends Religious Services:   . Active Member of Clubs or Organizations:   . Attends Banker Meetings:   Marland Kitchen Marital Status:     Allergies:  Allergies  Allergen Reactions  . Other Nausea Only    Per patient Pain medications, any kind  . Protonix [Pantoprazole Sodium]     Metabolic Disorder Labs: No results found for: HGBA1C, MPG No results found for: PROLACTIN Lab Results  Component Value Date   CHOL 184 02/07/2019   TRIG 170 (H) 02/07/2019   HDL 49 02/07/2019   CHOLHDL 3.8 02/07/2019   VLDL 37 (H) 05/01/2015   LDLCALC 105 (H) 02/07/2019   LDLCALC 129 (H) 11/22/2018   Lab Results  Component Value Date   TSH 0.562 11/22/2018   TSH 0.85 05/01/2015    Therapeutic Level Labs: No results found for: LITHIUM Lab Results  Component Value Date   VALPROATE 88 02/08/2018   VALPROATE 63.9 06/15/2017   No components found for:  CBMZ  Current Medications: Current Outpatient Medications  Medication Sig Dispense Refill  . ALPRAZolam (XANAX) 1 MG tablet Take 1 tablet (1 mg total) by mouth 3 (three) times daily as needed for anxiety. 90 tablet 2  . aspirin-acetaminophen-caffeine (EXCEDRIN MIGRAINE) 250-250-65 MG tablet Take 2 tablets by mouth every 8 (eight) hours as needed for headache.     . divalproex (DEPAKOTE ER) 500 MG 24 hr tablet Take 2 tablets (1,000 mg total) by mouth  at bedtime. 60 tablet 2  . esomeprazole (NEXIUM) 40 MG capsule 1 PO 30 MINUTES PRIOR TO MEALS BID for 3 mos then 1 po qd (Patient taking differently: Take 40 mg by mouth 2 (two) times daily before a meal. 1 PO 30 MINUTES PRIOR TO MEALS BID for 3 mos then 1 po qd) 60 capsule 11  . metoCLOPramide (REGLAN) 5 MG tablet 1 po 30 minutes prior to MEALS THREE TIMES A DAY AND AT BEDTIME. (Patient taking differently: Take 5 mg by mouth 3 (three) times daily before meals. ) 90 tablet 11  .  Multiple Vitamins-Minerals (ONE-A-DAY MENOPAUSE FORMULA PO) Take 1 tablet by mouth daily.    . QUEtiapine (SEROQUEL) 200 MG tablet Take 1 tablet (200 mg total) by mouth at bedtime. 30 tablet 2  . rosuvastatin (CRESTOR) 5 MG tablet TAKE (1) TABLET BY MOUTH ONCE DAILY FOR CHOLESTEROL. 30 tablet 2   No current facility-administered medications for this visit.     Musculoskeletal: Strength & Muscle Tone: within normal limits Gait & Station: normal Patient leans: N/A  Psychiatric Specialty Exam: Review of Systems  All other systems reviewed and are negative.   Last menstrual period 04/13/2015.There is no height or weight on file to calculate BMI.  General Appearance: Casual and Fairly Groomed  Eye Contact:  Good  Speech:  Clear and Coherent  Volume:  Normal  Mood:  Euthymic  Affect:  Appropriate and Congruent  Thought Process:  Goal Directed  Orientation:  Full (Time, Place, and Person)  Thought Content: WDL   Suicidal Thoughts:  No  Homicidal Thoughts:  No  Memory:  Immediate;   Good Recent;   Good Remote;   Good  Judgement:  Good  Insight:  Good  Psychomotor Activity:  Normal  Concentration:  Concentration: Good and Attention Span: Good  Recall:  Good  Fund of Knowledge: Good  Language: Good  Akathisia:  No  Handed:  Right  AIMS (if indicated): not done  Assets:  Communication Skills Desire for Improvement Physical Health Resilience Social Support Talents/Skills  ADL's:  Intact  Cognition: WNL   Sleep:  Good   Screenings: PHQ2-9     Office Visit from 01/31/2017 in [facility]  PHQ-2 Total Score  0       Assessment and Plan: This patient is a 54 year old female with a history of bipolar disorder anxiety and insomnia.  She seems to be doing fairly well.  She will continue Seroquel 200 mg at bedtime for mood stabilization and sleep, Xanax 1 mg 3 times daily for anxiety and Depakote ER 1000 mg at bedtime for mood stabilization.  She will return to see me in 3 months.  At some point in the next several months we will recheck her Depakote level.  Comprehensive metabolic panel recently was normal   [name], MD 04/18/2019, 4:10 PM

## 2019-05-23 ENCOUNTER — Ambulatory Visit: Payer: PRIVATE HEALTH INSURANCE | Admitting: Gastroenterology

## 2019-05-24 ENCOUNTER — Other Ambulatory Visit: Payer: Self-pay | Admitting: Family Medicine

## 2019-05-28 ENCOUNTER — Telehealth: Payer: Self-pay | Admitting: *Deleted

## 2019-05-28 NOTE — Telephone Encounter (Signed)
[name], you are scheduled for a virtual visit with your provider today.  Just as we do with appointments in the office, we must obtain your consent to participate.  Your consent will be active for this visit and any virtual visit you may have with one of our providers in the next 365 days.  If you have a MyChart account, I can also send a copy of this consent to you electronically.  All virtual visits are billed to your insurance company just like a traditional visit in the office.  As this is a virtual visit, video technology does not allow for your provider to perform a traditional examination.  This may limit your provider's ability to fully assess your condition.  If your provider identifies any concerns that need to be evaluated in person or the need to arrange testing such as labs, EKG, etc, we will make arrangements to do so.  Although advances in technology are sophisticated, we cannot ensure that it will always work on either your end or our end.  If the connection with a video visit is poor, we may have to switch to a telephone visit.  With either a video or telephone visit, we are not always able to ensure that we have a secure connection.   I need to obtain your verbal consent now.   Are you willing to proceed with your visit today?

## 2019-05-28 NOTE — Telephone Encounter (Signed)
Pt consented to a virtual visit. 

## 2019-05-30 ENCOUNTER — Other Ambulatory Visit: Payer: Self-pay

## 2019-05-30 ENCOUNTER — Encounter: Payer: Self-pay | Admitting: Gastroenterology

## 2019-05-30 ENCOUNTER — Telehealth (INDEPENDENT_AMBULATORY_CARE_PROVIDER_SITE_OTHER): Payer: PRIVATE HEALTH INSURANCE | Admitting: Gastroenterology

## 2019-05-30 DIAGNOSIS — K5900 Constipation, unspecified: Secondary | ICD-10-CM

## 2019-05-30 DIAGNOSIS — R198 Other specified symptoms and signs involving the digestive system and abdomen: Secondary | ICD-10-CM | POA: Insufficient documentation

## 2019-05-30 MED ORDER — TRULANCE 3 MG PO TABS: 3.0000 mg | ORAL_TABLET | Freq: Every day | ORAL | 5 refills | Status: DC

## 2019-05-30 NOTE — Progress Notes (Signed)
Primary Care Physician:  Mikey Kirschner, MD Primary GI:  Formerly Barney Drain, [facility]   Patient Location: Home  Provider Location: [place] office  Reason for Visit:  Chief Complaint  Patient presents with  . Constipation    taking dulcolax 2-3 times per week to have a BM, no bleeding, some straining and it caused hemorrhoids last week  . pp f/u tcs/egd    patient stopped reglan d/t problems with eyesight     Persons present on the virtual encounter, with roles: Patient, myself (provider),Mindy Estudillo CMA (updated meds and allergies)  Total time (minutes) spent on medical discussion: 15 minutes  Due to COVID-19, visit was conducted using Mychart video method.  Visit was requested by patient.  Virtual Visit via Mychart video  I connected with Anne Logan on 05/30/19 at  3:30 PM EDT by Mychart video and verified that I am speaking with the correct person using two identifiers.   I discussed the limitations, risks, security and privacy concerns of performing an evaluation and management service by telephone/video and the availability of in person appointments. I also discussed with the patient that there may be a patient responsible charge related to this service. The patient expressed understanding and agreed to proceed.   HPI:   Anne Logan is a 54 y.o. female who presents for virtual visit for follow up. Patient seen in 10/2018 to schedule first ever colonoscopy but had complaints of alternating constipation/diarrhea, lower abdominal pain.  CT A/P with contrast 10/2018 showed sigmoid diverticulosis.  Noted to have emphysema.  Multiple low-attenuation simple hepatic cysts or hemangiomata.  We advised patient to follow-up with PCP regarding the emphysema. She states she was not started on any treatment and she got the impression that her PCP did not think she had emphysema. Dr. Oneida Alar reviewed CT with Dr. Thornton Papas, radiologist, and he agreed that she does have emphysema. GES  12/2018 was normal.   EGD and colonoscopy January 2021: Chin esophageal stricture status post dilation.  Gastritis but no H. pylori.  On colonoscopy she had significant looping of the colon, internal hemorrhoids, moderate diverticulosis with restricted mobility of the rectosigmoid colon.  Recommended to have a 5-year surveillance colonoscopy due to prep, polyps less than 3 mm could have been missed.  Patient continues to have problems with constipation.  Going on for about a year now.  Previously contributing to multiple medication changes by psychiatry.  Since we last saw her she is come off trazodone because she felt like it messed her vision up.  She also stopped Reglan due to concerns for vision changes.  Denies any abdominal pain or heartburn.  No nausea.  For constipation she failed MiraLAX.  She would take magnesium citrate alternating with Dulcolax to try to get relief.  Denies weight loss.  Overall swallowing is much improved status post dilation.   Current Outpatient Medications  Medication Sig Dispense Refill  . ALPRAZolam (XANAX) 1 MG tablet Take 1 tablet (1 mg total) by mouth 3 (three) times daily as needed for anxiety. 90 tablet 2  . aspirin-acetaminophen-caffeine (EXCEDRIN MIGRAINE) 250-250-65 MG tablet Take 2 tablets by mouth every 8 (eight) hours as needed for headache.     . bisacodyl (DULCOLAX) 5 MG EC tablet Take 5 mg by mouth daily as needed for moderate constipation. 2-3 times per week    . divalproex (DEPAKOTE ER) 500 MG 24 hr tablet Take 2 tablets (1,000 mg total) by mouth at bedtime. 60 tablet  2  . esomeprazole (NEXIUM) 40 MG capsule 1 PO 30 MINUTES PRIOR TO MEALS BID for 3 mos then 1 po qd (Patient taking differently: Take 40 mg by mouth 2 (two) times daily before a meal. 1 PO 30 MINUTES PRIOR TO MEALS BID for 3 mos then 1 po qd) 60 capsule 11  . Multiple Vitamins-Minerals (ONE-A-DAY MENOPAUSE FORMULA PO) Take 1 tablet by mouth daily.    . QUEtiapine (SEROQUEL) 200 MG tablet  Take 1 tablet (200 mg total) by mouth at bedtime. 30 tablet 2  . rosuvastatin (CRESTOR) 5 MG tablet TAKE (1) TABLET BY MOUTH ONCE DAILY FOR CHOLESTEROL. 30 tablet 2   No current facility-administered medications for this visit.    Past Medical History:  Diagnosis Date  . Anxiety   . Depression   . GERD (gastroesophageal reflux disease)     Past Surgical History:  Procedure Laterality Date  . BIOPSY  01/23/2019   Procedure: BIOPSY;  Surgeon: West Bali, MD;  [facility];  Service: Endoscopy;;  duodenum gastric  . COLONOSCOPY WITH PROPOFOL N/A 01/23/2019   Dr. Darrick Penna: Significant looping of the colon, internal hemorrhoids, moderate diverticulosis with restricted mobility of the rectosigmoid colon.  Recommended 5-year follow-up colonoscopy due to prep, polyps less than 3 mm could have been missed.  . ESOPHAGOGASTRODUODENOSCOPY (EGD) WITH PROPOFOL N/A 01/23/2019   Dr. Darrick Penna: Benign-appearing esophageal stricture status post dilation.  Gastritis, no H. pylori.  Small bowel biopsies benign, no celiac.  Marland Kitchen SAVORY DILATION N/A 01/23/2019   Procedure: SAVORY DILATION;  Surgeon: West Bali, MD;  [facility];  Service: Endoscopy;  Laterality: N/A;  . TUBAL LIGATION Bilateral 2005    Family History  Problem Relation Age of Onset  . Depression Mother   . Cancer Mother        lymphoma  . Alcohol abuse Father   . Drug abuse Son   . Colon cancer Neg Hx     Social History   Socioeconomic History  . Marital status: Married    Spouse name: Not on file  . Number of children: Not on file  . Years of education: Not on file  . Highest education level: Not on file  Occupational History  . Not on file  Tobacco Use  . Smoking status: Current Every Day Smoker    Packs/day: 0.50    Years: 20.00    Pack years: 10.00    Types: Cigarettes  . Smokeless tobacco: Never Used  . Tobacco comment: smokes 10 cig daily  Substance and Sexual Activity  . Alcohol use: No     Alcohol/week: 0.0 standard drinks  . Drug use: No  . Sexual activity: Not Currently    Partners: Male    Birth control/protection: Surgical    Comment: tubal  Other Topics Concern  . Not on file  Social History Narrative  . Not on file   Social Determinants of Health   Financial Resource Strain:   . Difficulty of Paying Living Expenses:   Food Insecurity:   . Worried About Programme researcher, broadcasting/film/video in the Last Year:   . Barista in the Last Year:   Transportation Needs:   . Freight forwarder (Medical):   Marland Kitchen Lack of Transportation (Non-Medical):   Physical Activity:   . Days of Exercise per Week:   . Minutes of Exercise per Session:   Stress:   . Feeling of Stress :   Social Connections:   .  Frequency of Communication with Friends and Family:   . Frequency of Social Gatherings with Friends and Family:   . Attends Religious Services:   . Active Member of Clubs or Organizations:   . Attends Banker Meetings:   Marland Kitchen Marital Status:   Intimate Partner Violence:   . Fear of Current or Ex-Partner:   . Emotionally Abused:   Marland Kitchen Physically Abused:   . Sexually Abused:       ROS:  General: Negative for anorexia, weight loss, fever, chills, fatigue, weakness. Eyes: Negative for vision changes.  ENT: Negative for hoarseness, difficulty swallowing , nasal congestion. CV: Negative for chest pain, angina, palpitations, dyspnea on exertion, peripheral edema.  Respiratory: Negative for dyspnea at rest, dyspnea on exertion, cough, sputum, wheezing.  GI: See history of present illness. GU:  Negative for dysuria, hematuria, urinary incontinence, urinary frequency, nocturnal urination.  MS: Negative for joint pain, low back pain.  Derm: Negative for rash or itching.  Neuro: Negative for weakness, abnormal sensation, seizure, frequent headaches, memory loss, confusion.  Psych: Negative for anxiety, depression, suicidal ideation, hallucinations.  Endo: Negative for unusual  weight change.  Heme: Negative for bruising or bleeding. Allergy: Negative for rash or hives.   Observations/Objective: Pleasant, cooperative, no acute distress.  Otherwise exam unavailable.  Assessment and Plan: 54 year old female with chronic GERD, dysphagia status post esophageal dilation of stricture, constipation.  GERD: Well-controlled on current regimen.  Continue Nexium 40 mg once to twice daily.  Monitor for recurrent dysphagia.  Reinforced antireflux measures.  Constipation: Inadequate control.  Failed MiraLAX.  Currently taking magnesium citrate and Dulcolax over-the-counter.  Start Trulance 3 mg daily.  Follow Up Instructions:    I discussed the assessment and treatment plan with the patient. The patient was provided an opportunity to ask questions and all were answered. The patient agreed with the plan and demonstrated an understanding of the instructions. AVS mailed to patient's home address.   The patient was advised to call back or seek an in-person evaluation if the symptoms worsen or if the condition fails to improve as anticipated.  I provided 15 minutes of virtual face-to-face time during this encounter.   Tana Coast, PA-C

## 2019-05-30 NOTE — Patient Instructions (Addendum)
1. Continue to drink plenty of liquids each day. Drink at least 64 ounces of water daily. 2. Start Trulance 3mg  daily for constipation. You can take every other day if your stools are too frequent or too loose.  3. I will review your CT with the radiologist and let you know more about possible emphysema. Further recommendations to follow.

## 2019-06-04 ENCOUNTER — Telehealth: Payer: Self-pay | Admitting: Gastroenterology

## 2019-06-04 NOTE — Telephone Encounter (Signed)
Pt stated [facility] did get the truelance 3mg  rx. However, for some reason there is not signature on the rx and they need another one sent over  in order to fill it. Notified her they provider is off today and will return tomorrow

## 2019-06-04 NOTE — Telephone Encounter (Signed)
Pt called to say that [facility] needed a signature on her Trulance prescription before they would fill it. Please advise. [phone]

## 2019-06-04 NOTE — Telephone Encounter (Signed)
Rx needs PA NOT A Signature please disregard. Working on [employer] now (key [id number](

## 2019-06-06 ENCOUNTER — Encounter: Payer: Self-pay | Admitting: Gastroenterology

## 2019-06-06 ENCOUNTER — Telehealth: Payer: Self-pay | Admitting: Emergency Medicine

## 2019-06-06 NOTE — Telephone Encounter (Signed)
Noted. Please let her know that I have sent in Linzess daily on empty stomach for constipation. She has to try before Trulance per insurance.   Also let her know that review of CT from last year does show emphysema. If she is not having cough/congestion likely reason her PCP did not provide treatment.   Make ov here in six months. Please make appt.

## 2019-06-06 NOTE — Telephone Encounter (Signed)
OV made °

## 2019-06-06 NOTE — Telephone Encounter (Signed)
Did PA for truelance, Pt insurance company will not pay for truelance until pt has tried and failed linzess. Please advise

## 2019-06-06 NOTE — Progress Notes (Signed)
Cc'ed to pcp °

## 2019-06-06 NOTE — Telephone Encounter (Signed)
Called Pt made an appt made for oct . notified her rx was sent into pharmacy.  Notified her of results of ct from last year.  Pt stated she is not having a cough or congestion or any other symptoms

## 2019-07-24 ENCOUNTER — Telehealth (INDEPENDENT_AMBULATORY_CARE_PROVIDER_SITE_OTHER): Payer: PRIVATE HEALTH INSURANCE | Admitting: Psychiatry

## 2019-07-24 ENCOUNTER — Encounter (HOSPITAL_COMMUNITY): Payer: Self-pay | Admitting: Psychiatry

## 2019-07-24 ENCOUNTER — Other Ambulatory Visit: Payer: Self-pay

## 2019-07-24 DIAGNOSIS — F3162 Bipolar disorder, current episode mixed, moderate: Secondary | ICD-10-CM

## 2019-07-24 MED ORDER — DIVALPROEX SODIUM ER 500 MG PO TB24: 1000.0000 mg | ORAL_TABLET | Freq: Every day | ORAL | 2 refills | Status: DC

## 2019-07-24 MED ORDER — ALPRAZOLAM 1 MG PO TABS: 1.0000 mg | ORAL_TABLET | Freq: Three times a day (TID) | ORAL | 2 refills | Status: DC | PRN

## 2019-07-24 MED ORDER — QUETIAPINE FUMARATE 200 MG PO TABS: 200.0000 mg | ORAL_TABLET | Freq: Every day | ORAL | 2 refills | Status: DC

## 2019-07-24 NOTE — Progress Notes (Signed)
Virtual Visit via Video Note  I connected with Anne Logan on 07/24/19 at  3:00 PM EDT by a video enabled telemedicine application and verified that I am speaking with the correct person using two identifiers.   I discussed the limitations of evaluation and management by telemedicine and the availability of in person appointments. The patient expressed understanding and agreed to proceed.     I discussed the assessment and treatment plan with the patient. The patient was provided an opportunity to ask questions and all were answered. The patient agreed with the plan and demonstrated an understanding of the instructions.   The patient was advised to call back or seek an in-person evaluation if the symptoms worsen or if the condition fails to improve as anticipated.  I provided 15 minutes of non-face-to-face time during this encounter. Location: Provider office, patient home  Anne Ruder, MD  [facility] MD/PA/NP OP Progress Note  07/24/2019 3:09 PM Anne Logan  MRN:  [id number]  Chief Complaint:  Chief Complaint    Depression; Anxiety; Follow-up     HPI: This patient is a 54 year old married white female who lives with her husband in [place].  She works as an Higher education careers adviser system.  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  The patient returns for follow-up after 3 months regarding her bipolar disorder and anxiety.  She reports that she is doing well.  She is going back into the office quite a bit more now for work.  She is not having any difficulties doing this.  Her anxiety is under good control with the Xanax she is sleeping fairly well now with the increase Seroquel.  She denies being depressed having severe panic attacks or suicidal ideation.  She denies any manic symptoms.  She states that she is still stable on her current regimen Visit Diagnosis:    ICD-10-CM   1. Bipolar 1 disorder, mixed, moderate (HCC)  F31.62     Past Psychiatric History: Hospitalization  many years ago.  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 Anxiety   . Depression   . GERD (gastroesophageal reflux disease)     Past Surgical History:  Procedure Laterality Date  . BIOPSY  01/23/2019   Procedure: BIOPSY;  Surgeon: West Bali, MD;  [facility];  Service: Endoscopy;;  duodenum gastric  . COLONOSCOPY WITH PROPOFOL N/A 01/23/2019   Dr. Darrick Penna: Significant looping of the colon, internal hemorrhoids, moderate diverticulosis with restricted mobility of the rectosigmoid colon.  Recommended 5-year follow-up colonoscopy due to prep, polyps less than 3 mm could have been missed.  . ESOPHAGOGASTRODUODENOSCOPY (EGD) WITH PROPOFOL N/A 01/23/2019   Dr. Darrick Penna: Benign-appearing esophageal stricture status post dilation.  Gastritis, no H. pylori.  Small bowel biopsies benign, no celiac.  Marland Kitchen SAVORY DILATION N/A 01/23/2019   Procedure: SAVORY DILATION;  Surgeon: West Bali, MD;  [facility];  Service: Endoscopy;  Laterality: N/A;  . TUBAL LIGATION Bilateral 2005    Family Psychiatric History: see below  Family History:  Family History  Problem Relation Age of Onset  . Depression Mother   . Cancer Mother        lymphoma  . Alcohol abuse Father   . Drug abuse Son   . Colon cancer Neg Hx     Social History:  Social History   Socioeconomic History  . Marital status: Married  Spouse name: Not on file  . Number of children: Not on file  . Years of education: Not on file  . Highest education level: Not on file  Occupational History  . Not on file  Tobacco Use  . Smoking status: Current Every Day Smoker    Packs/day: 0.50    Years: 20.00    Pack years: 10.00    Types: Cigarettes  . Smokeless tobacco: Never Used  . Tobacco comment: smokes 10 cig daily  Substance and Sexual Activity  . Alcohol use: No    Alcohol/week: 0.0 standard drinks  . Drug use: No  . Sexual activity:  Not Currently    Partners: Male    Birth control/protection: Surgical    Comment: tubal  Other Topics Concern  . Not on file  Social History Narrative  . Not on file   Social Determinants of Health   Financial Resource Strain:   . Difficulty of Paying Living Expenses:   Food Insecurity:   . Worried About Programme researcher, broadcasting/film/video in the Last Year:   . Barista in the Last Year:   Transportation Needs:   . Freight forwarder (Medical):   Marland Kitchen Lack of Transportation (Non-Medical):   Physical Activity:   . Days of Exercise per Week:   . Minutes of Exercise per Session:   Stress:   . Feeling of Stress :   Social Connections:   . Frequency of Communication with Friends and Family:   . Frequency of Social Gatherings with Friends and Family:   . Attends Religious Services:   . Active Member of Clubs or Organizations:   . Attends Banker Meetings:   Marland Kitchen Marital Status:     Allergies:  Allergies  Allergen Reactions  . Other Nausea Only    Per patient Pain medications, any kind  . Protonix [Pantoprazole Sodium]     Metabolic Disorder Labs: No results found for: HGBA1C, MPG No results found for: PROLACTIN Lab Results  Component Value Date   CHOL 184 02/07/2019   TRIG 170 (H) 02/07/2019   HDL 49 02/07/2019   CHOLHDL 3.8 02/07/2019   VLDL 37 (H) 05/01/2015   LDLCALC 105 (H) 02/07/2019   LDLCALC 129 (H) 11/22/2018   Lab Results  Component Value Date   TSH 0.562 11/22/2018   TSH 0.85 05/01/2015    Therapeutic Level Labs: No results found for: LITHIUM Lab Results  Component Value Date   VALPROATE 88 02/08/2018   VALPROATE 63.9 06/15/2017   No components found for:  CBMZ  Current Medications: Current Outpatient Medications  Medication Sig Dispense Refill  . ALPRAZolam (XANAX) 1 MG tablet Take 1 tablet (1 mg total) by mouth 3 (three) times daily as needed for anxiety. 90 tablet 2  . aspirin-acetaminophen-caffeine (EXCEDRIN MIGRAINE) 250-250-65 MG  tablet Take 2 tablets by mouth every 8 (eight) hours as needed for headache.     . bisacodyl (DULCOLAX) 5 MG EC tablet Take 5 mg by mouth daily as needed for moderate constipation. 2-3 times per week    . divalproex (DEPAKOTE ER) 500 MG 24 hr tablet Take 2 tablets (1,000 mg total) by mouth at bedtime. 60 tablet 2  . esomeprazole (NEXIUM) 40 MG capsule 1 PO 30 MINUTES PRIOR TO MEALS BID for 3 mos then 1 po qd (Patient taking differently: Take 40 mg by mouth 2 (two) times daily before a meal. 1 PO 30 MINUTES PRIOR TO MEALS BID for 3 mos then 1 po  qd) 60 capsule 11  . Multiple Vitamins-Minerals (ONE-A-DAY MENOPAUSE FORMULA PO) Take 1 tablet by mouth daily.    Marland Kitchen Plecanatide (TRULANCE) 3 MG TABS Take 3 mg by mouth daily. 30 tablet 5  . QUEtiapine (SEROQUEL) 200 MG tablet Take 1 tablet (200 mg total) by mouth at bedtime. 30 tablet 2  . rosuvastatin (CRESTOR) 5 MG tablet TAKE (1) TABLET BY MOUTH ONCE DAILY FOR CHOLESTEROL. 30 tablet 2   No current facility-administered medications for this visit.     Musculoskeletal: Strength & Muscle Tone: within normal limits Gait & Station: normal Patient leans: N/A  Psychiatric Specialty Exam: Review of Systems  All other systems reviewed and are negative.   Last menstrual period 04/13/2015.There is no height or weight on file to calculate BMI.  General Appearance: Casual and Fairly Groomed  Eye Contact:  Good  Speech:  Clear and Coherent  Volume:  Normal  Mood:  Euthymic  Affect:  Appropriate and Congruent  Thought Process:  Goal Directed  Orientation:  Full (Time, Place, and Person)  Thought Content: WDL   Suicidal Thoughts:  No  Homicidal Thoughts:  No  Memory:  Immediate;   Good Recent;   Good Remote;   Good  Judgement:  Good  Insight:  Fair  Psychomotor Activity:  Normal  Concentration:  Concentration: Good and Attention Span: Good  Recall:  Good  Fund of Knowledge: Good  Language: Good  Akathisia:  No  Handed:  right  AIMS (if  indicated): not done  Assets:  Communication Skills Desire for Improvement Physical Health Resilience Social Support Talents/Skills  ADL's:  Intact  Cognition: WNL  Sleep:  Good   Screenings: PHQ2-9     Office Visit from 01/31/2017 in [facility]  PHQ-2 Total Score 0       Assessment and Plan: This patient is a 54 year old female with a history of bipolar disorder anxiety and insomnia.  She continues to do well.  She will continue Seroquel 200 mg at bedtime for mood stabilization and sleep, Xanax 1 mg 3 times daily for anxiety and Depakote ER 1000 mg at bedtime for mood stabilization.  She will return to see me in 3 months   Anne Ruder, MD 07/24/2019, 3:10 PM

## 2019-07-27 ENCOUNTER — Other Ambulatory Visit (HOSPITAL_COMMUNITY): Payer: Self-pay | Admitting: Family Medicine

## 2019-07-27 DIAGNOSIS — Z1231 Encounter for screening mammogram for malignant neoplasm of breast: Secondary | ICD-10-CM

## 2019-08-20 ENCOUNTER — Ambulatory Visit (HOSPITAL_COMMUNITY): Payer: PRIVATE HEALTH INSURANCE

## 2019-08-23 ENCOUNTER — Ambulatory Visit (HOSPITAL_COMMUNITY)
Admission: RE | Admit: 2019-08-23 | Discharge: 2019-08-23 | Disposition: A | Discharge status: Home / Self Care | Payer: PRIVATE HEALTH INSURANCE | Source: Ambulatory Visit | Attending: Family Medicine | Admitting: Family Medicine

## 2019-08-23 ENCOUNTER — Encounter (HOSPITAL_COMMUNITY): Payer: Self-pay

## 2019-08-23 ENCOUNTER — Other Ambulatory Visit: Payer: Self-pay

## 2019-08-23 DIAGNOSIS — Z1231 Encounter for screening mammogram for malignant neoplasm of breast: Secondary | ICD-10-CM

## 2019-08-29 ENCOUNTER — Other Ambulatory Visit: Payer: Self-pay | Admitting: Family Medicine

## 2019-08-29 NOTE — Telephone Encounter (Signed)
Pt is needing refill on medication she is completely out. Pt states pharmacy told her they faxed request over last week.   She has follow up on 9/10

## 2019-09-07 ENCOUNTER — Ambulatory Visit: Payer: PRIVATE HEALTH INSURANCE | Admitting: Nurse Practitioner

## 2019-09-13 ENCOUNTER — Encounter: Payer: Self-pay | Admitting: Family Medicine

## 2019-09-13 ENCOUNTER — Ambulatory Visit (INDEPENDENT_AMBULATORY_CARE_PROVIDER_SITE_OTHER): Payer: PRIVATE HEALTH INSURANCE | Admitting: Family Medicine

## 2019-09-13 ENCOUNTER — Other Ambulatory Visit: Payer: Self-pay

## 2019-09-13 VITALS — BP 122/86 | HR 88 | Temp 97.0°F | Ht 64.0 in | Wt 130.8 lb

## 2019-09-13 DIAGNOSIS — M791 Myalgia, unspecified site: Secondary | ICD-10-CM | POA: Insufficient documentation

## 2019-09-13 NOTE — Progress Notes (Signed)
Patient ID: Anne Logan, female    DOB: Mar 11, 1965, 54 y.o.   MRN: [id number]   Chief Complaint  Patient presents with  . Back Pain    for 2-3 weeks   Subjective:    HPI Anne Logan presents today with neck, back, bil. leg soreness that she attributes to a new recliner that they purchased that turned out to be malfunctioning.  Medical History Anne Logan has a past medical history of Anxiety, Depression, and GERD (gastroesophageal reflux disease).   Outpatient Encounter Medications as of 09/13/2019  Medication Sig  . ALPRAZolam (XANAX) 1 MG tablet Take 1 tablet (1 mg total) by mouth 3 (three) times daily as needed for anxiety.  Marland Kitchen aspirin-acetaminophen-caffeine (EXCEDRIN MIGRAINE) 250-250-65 MG tablet Take 2 tablets by mouth every 8 (eight) hours as needed for headache.   . bisacodyl (DULCOLAX) 5 MG EC tablet Take 5 mg by mouth daily as needed for moderate constipation. 2-3 times per week  . divalproex (DEPAKOTE ER) 500 MG 24 hr tablet Take 2 tablets (1,000 mg total) by mouth at bedtime.  Marland Kitchen esomeprazole (NEXIUM) 40 MG capsule 1 PO 30 MINUTES PRIOR TO MEALS BID for 3 mos then 1 po qd (Patient taking differently: Take 40 mg by mouth 2 (two) times daily before a meal. 1 PO 30 MINUTES PRIOR TO MEALS BID for 3 mos then 1 po qd)  . Multiple Vitamins-Minerals (ONE-A-DAY MENOPAUSE FORMULA PO) Take 1 tablet by mouth daily.  Marland Kitchen Plecanatide (TRULANCE) 3 MG TABS Take 3 mg by mouth daily.  . QUEtiapine (SEROQUEL) 200 MG tablet Take 1 tablet (200 mg total) by mouth at bedtime.  . rosuvastatin (CRESTOR) 5 MG tablet TAKE (1) TABLET BY MOUTH ONCE DAILY FOR CHOLESTEROL.   No facility-administered encounter medications on file as of 09/13/2019.     Review of Systems  Constitutional: Negative for chills and fever.  Respiratory: Negative.   Cardiovascular: Negative.   Musculoskeletal: Positive for back pain and neck pain. Negative for joint swelling.       Describes as sore from neck to calves on both  side of body.  Neurological: Negative for dizziness, syncope, weakness, light-headedness, numbness and headaches.  Psychiatric/Behavioral:       See psyche for management of bipolar.     Vitals BP 122/86   Pulse 88   Temp (!) 97 F (36.1 C) (Oral)   Ht 5\' 4"  (1.626 m)   Wt 130 lb 12.8 oz (59.3 kg)   LMP 04/13/2015   SpO2 99%   BMI 22.45 kg/m   Objective:   Physical Exam Vitals and nursing note reviewed.  Constitutional:      Appearance: She is not ill-appearing.  Eyes:     Extraocular Movements: Extraocular movements intact.     Pupils: Pupils are equal, round, and reactive to light.  Cardiovascular:     Rate and Rhythm: Normal rate and regular rhythm.     Pulses: Normal pulses.     Heart sounds: Normal heart sounds.  Pulmonary:     Breath sounds: Normal breath sounds.  Musculoskeletal:        General: No swelling, tenderness, deformity or signs of injury.     Cervical back: Normal range of motion.     Comments: Soreness reproducible with palpation of muscles. Neuro exam negative.   Skin:    General: Skin is warm and dry.  Neurological:     Mental Status: She is alert. Mental status is at baseline.     Cranial  Nerves: Cranial nerves are intact.     Sensory: Sensation is intact.     Motor: Motor function is intact.     Coordination: Coordination is intact.     Gait: Gait is intact.     Comments: Denies numbness, tingling, radiating pain, or weakness.       Assessment and Plan   1. Muscle soreness - Basic metabolic panel - CK (Creatine Kinase)   You have stressed your muscles with the bad recliner. There are no red flag neuro signs today. She is on atypical psyche medications for her bipolar and we need to be cautious prescribing. She is not comfortable taking OTC medications at this time. Will treat conservatively. Detailed instructions given on AVS.   Will order BMP and CK to make sure this is just muscle over use. Message sent through My Chart about labs.      Agrees with plan of care discussed today. Understands warning signs to seek further care: numbness, tingling, radiating pain.  Understands to follow-up if symptoms do not improve or if anything changes. Will notify when lab results are available.  Dorena Bodo, FNP-C

## 2019-09-13 NOTE — Patient Instructions (Addendum)
I think the new recliner chair has stressed your muscles from your neck to your legs. Continue taking the Excedrin as you are doing- twice per day. Don't take too much Tylenol in a day. Consider a massage (get your husband to rub your neck and back). Use something like icy Hot on your sore muscles too. Use the heating pad, be very careful to protect your skin while using it. Soak in a hot tub if you can. Do the exercises listed below.    Back Exercises  These exercises help to make your trunk and back strong. They also help to keep the lower back flexible. Doing these exercises can help to prevent back pain or lessen existing pain.  If you have back pain, try to do these exercises 2-3 times each day or as told by your doctor.  As you get better, do the exercises once each day. Repeat the exercises more often as told by your doctor.  To stop back pain from coming back, do the exercises once each day, or as told by your doctor. Exercises Single knee to chest Do these steps 3-5 times in a row for each leg: 1. Lie on your back on a firm bed or the floor with your legs stretched out. 2. Bring one knee to your chest. 3. Grab your knee or thigh with both hands and hold them it in place. 4. Pull on your knee until you feel a gentle stretch in your lower back or buttocks. 5. Keep doing the stretch for 10-30 seconds. 6. Slowly let go of your leg and straighten it. Pelvic tilt Do these steps 5-10 times in a row: 1. Lie on your back on a firm bed or the floor with your legs stretched out. 2. Bend your knees so they point up to the ceiling. Your feet should be flat on the floor. 3. Tighten your lower belly (abdomen) muscles to press your lower back against the floor. This will make your tailbone point up to the ceiling instead of pointing down to your feet or the floor. 4. Stay in this position for 5-10 seconds while you gently tighten your muscles and breathe evenly. Cat-cow Do these steps  until your lower back bends more easily: 1. Get on your hands and knees on a firm surface. Keep your hands under your shoulders, and keep your knees under your hips. You may put padding under your knees. 2. Let your head hang down toward your chest. Tighten (contract) the muscles in your belly. Point your tailbone toward the floor so your lower back becomes rounded like the back of a cat. 3. Stay in this position for 5 seconds. 4. Slowly lift your head. Let the muscles of your belly relax. Point your tailbone up toward the ceiling so your back forms a sagging arch like the back of a cow. 5. Stay in this position for 5 seconds.  Press-ups Do these steps 5-10 times in a row: 1. Lie on your belly (face-down) on the floor. 2. Place your hands near your head, about shoulder-width apart. 3. While you keep your back relaxed and keep your hips on the floor, slowly straighten your arms to raise the top half of your body and lift your shoulders. Do not use your back muscles. You may change where you place your hands in order to make yourself more comfortable. 4. Stay in this position for 5 seconds. 5. Slowly return to lying flat on the floor.  Bridges Do these steps 10  times in a row: 1. Lie on your back on a firm surface. 2. Bend your knees so they point up to the ceiling. Your feet should be flat on the floor. Your arms should be flat at your sides, next to your body. 3. Tighten your butt muscles and lift your butt off the floor until your waist is almost as high as your knees. If you do not feel the muscles working in your butt and the back of your thighs, slide your feet 1-2 inches farther away from your butt. 4. Stay in this position for 3-5 seconds. 5. Slowly lower your butt to the floor, and let your butt muscles relax. If this exercise is too easy, try doing it with your arms crossed over your chest. Belly crunches Do these steps 5-10 times in a row: 1. Lie on your back on a firm bed or the  floor with your legs stretched out. 2. Bend your knees so they point up to the ceiling. Your feet should be flat on the floor. 3. Cross your arms over your chest. 4. Tip your chin a little bit toward your chest but do not bend your neck. 5. Tighten your belly muscles and slowly raise your chest just enough to lift your shoulder blades a tiny bit off of the floor. Avoid raising your body higher than that, because it can put too much stress on your low back. 6. Slowly lower your chest and your head to the floor. Back lifts Do these steps 5-10 times in a row: 1. Lie on your belly (face-down) with your arms at your sides, and rest your forehead on the floor. 2. Tighten the muscles in your legs and your butt. 3. Slowly lift your chest off of the floor while you keep your hips on the floor. Keep the back of your head in line with the curve in your back. Look at the floor while you do this. 4. Stay in this position for 3-5 seconds. 5. Slowly lower your chest and your face to the floor. Contact a doctor if:  Your back pain gets a lot worse when you do an exercise.  Your back pain does not get better 2 hours after you exercise. If you have any of these problems, stop doing the exercises. Do not do them again unless your doctor says it is okay. Get help right away if:  You have sudden, very bad back pain. If this happens, stop doing the exercises. Do not do them again unless your doctor says it is okay. This information is not intended to replace advice given to you by your health care provider. Make sure you discuss any questions you have with your health care provider. Document Revised: 09/29/2017 Document Reviewed: 09/29/2017 Elsevier Patient Education  2020 [employer].

## 2019-09-14 LAB — BASIC METABOLIC PANEL
BUN/Creatinine Ratio: 23 (ref 9–23)
BUN: 14 mg/dL (ref 6–24)
CO2: 25 mmol/L (ref 20–29)
Calcium: 9.5 mg/dL (ref 8.7–10.2)
Chloride: 105 mmol/L (ref 96–106)
Creatinine, Ser: 0.62 mg/dL (ref 0.57–1.00)
GFR calc Af Amer: 118 mL/min/{1.73_m2} (ref >59)
GFR calc non Af Amer: 103 mL/min/{1.73_m2} (ref >59)
Glucose: 103 mg/dL — ABNORMAL HIGH (ref 65–99)
Potassium: 4.2 mmol/L (ref 3.5–5.2)
Sodium: 144 mmol/L (ref 134–144)

## 2019-09-14 LAB — CK: Total CK: 56 U/L (ref 32–182)

## 2019-09-28 ENCOUNTER — Ambulatory Visit: Payer: PRIVATE HEALTH INSURANCE | Admitting: Nurse Practitioner

## 2019-10-19 ENCOUNTER — Encounter: Payer: Self-pay | Admitting: Nurse Practitioner

## 2019-10-19 ENCOUNTER — Ambulatory Visit (INDEPENDENT_AMBULATORY_CARE_PROVIDER_SITE_OTHER): Payer: PRIVATE HEALTH INSURANCE | Admitting: Nurse Practitioner

## 2019-10-19 ENCOUNTER — Other Ambulatory Visit: Payer: Self-pay

## 2019-10-19 VITALS — Temp 97.1°F

## 2019-10-19 DIAGNOSIS — G5793 Unspecified mononeuropathy of bilateral lower limbs: Secondary | ICD-10-CM

## 2019-10-19 DIAGNOSIS — J029 Acute pharyngitis, unspecified: Secondary | ICD-10-CM | POA: Diagnosis not present

## 2019-10-19 DIAGNOSIS — R059 Cough, unspecified: Secondary | ICD-10-CM | POA: Diagnosis not present

## 2019-10-19 LAB — POCT RAPID STREP A (OFFICE): Rapid Strep A Screen: NEGATIVE

## 2019-10-19 NOTE — Progress Notes (Signed)
° °  Subjective:    Patient ID: Anne Logan, female    DOB: September 03, 1965, 54 y.o.   MRN: [id number]  Sore Throat  This is a new problem. Episode onset: Monday. There has been no fever. Associated symptoms include congestion, coughing and a hoarse voice. Associated symptoms comments: Fatigue, aches .  Originally scheduled for physical but had concerning symptoms.  Started with hoarseness earlier this week. No fever. Some sore throat. Taking fluids well. Voiding nl.  Some ear pain. Has developed a cough. No CP or SOB. No known contacts.  Increased anxiety due to her husband's prolonged illness. Has increased her smoking due to stress. Also complaints of burning and pin prick sensation on the sides and soles of both feet. Has been going on for months. Patient was seen outside for a car visit.     Review of Systems  HENT: Positive for congestion and hoarse voice.   Respiratory: Positive for cough.        Objective:   Physical Exam NAD. Alert, oriented. Rt TM: mostly obscured with cerumen. Lt TM: retraced, no erythema. Pharynx mildly injected, no exudate noted. Neck supple with mild anterior adenopathy. Lungs clear. Heart RRR. Exam limited due to outside visit. DP pulses strong bilaterally with normal cap refill in the toes. No LE edema.  Results for orders placed or performed in visit on 10/19/19  POCT rapid strep A  Result Value Ref Range   Rapid Strep A Screen Negative Negative   Today's Vitals   10/19/19 1520  Temp: (!) 97.1 F (36.2 C)  TempSrc: Temporal  SpO2: 97%   There is no height or weight on file to calculate BMI.       Assessment & Plan:  Sore throat - Plan: POCT rapid strep A, Culture, Group A Strep, Novel Coronavirus, NAA ([facility])  Cough  Neuropathy of both feet - Plan: Hemoglobin A1c, Vitamin B12, Ferritin   COVID test and throat culture pending. No antibiotics indicated at this time.  Discussed measures to use at home to prevent spread.  Warning signs  reviewed.  Call back early next week if no improvement, go to ED or urgent care this weekend if worse. Initial labs ordered for assessment of her neuropathic symptoms.  Further follow-up based on these results.  Encourage patient to reschedule her wellness exam.

## 2019-10-20 ENCOUNTER — Encounter: Payer: Self-pay | Admitting: Nurse Practitioner

## 2019-10-21 LAB — NOVEL CORONAVIRUS, NAA: SARS-CoV-2, NAA: NOT DETECTED

## 2019-10-21 LAB — SARS-COV-2, NAA 2 DAY TAT

## 2019-10-22 ENCOUNTER — Telehealth: Payer: Self-pay

## 2019-10-22 LAB — CULTURE, GROUP A STREP: Strep A Culture: NEGATIVE

## 2019-10-22 NOTE — Telephone Encounter (Signed)
See result note.  

## 2019-10-22 NOTE — Telephone Encounter (Signed)
Pt returning nurse phone call   Call back [phone]

## 2019-10-23 ENCOUNTER — Other Ambulatory Visit: Payer: Self-pay | Admitting: *Deleted

## 2019-10-23 ENCOUNTER — Telehealth: Payer: Self-pay | Admitting: *Deleted

## 2019-10-23 MED ORDER — AZITHROMYCIN 250 MG PO TABS: ORAL_TABLET | ORAL | 0 refills | Status: DC

## 2019-10-23 NOTE — Telephone Encounter (Signed)
Pls call in azithromycin 6 tab, take 2 on day 1 and then 1 TAB for 4 more days.  No refills.  Cough syrup otc (mucinex or delsym), flonase, and tylenol/ibuprofen prn.   If not improving, then call or rto for exam.   Thx.   Dr. Ladona Ridgel

## 2019-10-23 NOTE — Telephone Encounter (Signed)
Called and discussed with pt. And med sent to pharm.

## 2019-10-23 NOTE — Telephone Encounter (Signed)
See result note. Pt calling back to check on message.  Pt was seen last Friday by Anne Logan and states she told her she did not want to prescribe anything til she got strep and covid test results. Pt states she was up all  Night coughing, feeling the same as when seen no better no worse. Can't sleep at night due to cough, sore throat, ear pain, no fever, no sob.  [facility].

## 2019-10-24 ENCOUNTER — Other Ambulatory Visit: Payer: Self-pay

## 2019-10-24 ENCOUNTER — Telehealth (INDEPENDENT_AMBULATORY_CARE_PROVIDER_SITE_OTHER): Payer: PRIVATE HEALTH INSURANCE | Admitting: Psychiatry

## 2019-10-24 ENCOUNTER — Other Ambulatory Visit (HOSPITAL_COMMUNITY): Payer: Self-pay | Admitting: Psychiatry

## 2019-10-24 ENCOUNTER — Encounter (HOSPITAL_COMMUNITY): Payer: Self-pay | Admitting: Psychiatry

## 2019-10-24 DIAGNOSIS — F3162 Bipolar disorder, current episode mixed, moderate: Secondary | ICD-10-CM

## 2019-10-24 MED ORDER — QUETIAPINE FUMARATE 200 MG PO TABS: 200.0000 mg | ORAL_TABLET | Freq: Every day | ORAL | 2 refills | Status: DC

## 2019-10-24 MED ORDER — ALPRAZOLAM 1 MG PO TABS: 1.0000 mg | ORAL_TABLET | Freq: Three times a day (TID) | ORAL | 2 refills | Status: DC | PRN

## 2019-10-24 MED ORDER — DIVALPROEX SODIUM ER 500 MG PO TB24: 1000.0000 mg | ORAL_TABLET | Freq: Every day | ORAL | 2 refills | Status: DC

## 2019-10-24 NOTE — Progress Notes (Signed)
Virtual Visit via Video Note  I connected with Anne Logan on 10/24/19 at  4:00 PM EDT by a video enabled telemedicine application and verified that I am speaking with the correct person using two identifiers.   I discussed the limitations of evaluation and management by telemedicine and the availability of in person appointments. The patient expressed understanding and agreed to proceed.    I discussed the assessment and treatment plan with the patient. The patient was provided an opportunity to ask questions and all were answered. The patient agreed with the plan and demonstrated an understanding of the instructions.   The patient was advised to call back or seek an in-person evaluation if the symptoms worsen or if the condition fails to improve as anticipated.  I provided 15 minutes of non-face-to-face time during this encounter. Location: Provider Home, patient home  Anne Ruder, MD  [facility] MD/PA/NP OP Progress Note  10/24/2019 4:15 PM Anne Logan  MRN:  [id number]  Chief Complaint:  Chief Complaint    Anxiety; Depression; Follow-up     HPI: This patient is a 54 year old married white female lives with her husband in [place].  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  She has one grown son.  The patient returns for follow-up regarding her bipolar disorder and anxiety after 3 months.  She states overall she is doing well.  Her husband has had such.as liver cirrhosis and recurrent pancreatitis.  His health it seems to be getting worse as he already has chronic renal disease and is on dialysisis.  The patient is quite worried about him but she is trying to maintain the best she can.  She does think her medications are helpful for her mood swings and sleep and the Xanax continues to help her anxiety.  She denies being depressed or having any manic symptoms  Visit Diagnosis: Bipolar disorder, mixed  Past Psychiatric History: Hospitalization many  years ago.  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 Anxiety   . Depression   . GERD (gastroesophageal reflux disease)     Past Surgical History:  Procedure Laterality Date  . BIOPSY  01/23/2019   Procedure: BIOPSY;  Surgeon: West Bali, MD;  Location: [facility];  Service: Endoscopy;;  duodenum gastric  . COLONOSCOPY WITH PROPOFOL N/A 01/23/2019   Dr. Darrick Penna: Significant looping of the colon, internal hemorrhoids, moderate diverticulosis with restricted mobility of the rectosigmoid colon.  Recommended 5-year follow-up colonoscopy due to prep, polyps less than 3 mm could have been missed.  . ESOPHAGOGASTRODUODENOSCOPY (EGD) WITH PROPOFOL N/A 01/23/2019   Dr. Darrick Penna: Benign-appearing esophageal stricture status post dilation.  Gastritis, no H. pylori.  Small bowel biopsies benign, no celiac.  Marland Kitchen SAVORY DILATION N/A 01/23/2019   Procedure: SAVORY DILATION;  Surgeon: West Bali, MD;  Location: [facility];  Service: Endoscopy;  Laterality: N/A;  . TUBAL LIGATION Bilateral 2005    Family Psychiatric History: see below  Family History:  Family History  Problem Relation Age of Onset  . Depression Mother   . Cancer Mother        lymphoma  . Alcohol abuse Father   . Drug abuse Son   . Colon cancer Neg Hx     Social History:  Social History   Socioeconomic History  . Marital status: Married    Spouse name: Not on file  . Number  of children: Not on file  . Years of education: Not on file  . Highest education level: Not on file  Occupational History  . Not on file  Tobacco Use  . Smoking status: Current Every Day Smoker    Packs/day: 0.50    Years: 20.00    Pack years: 10.00    Types: Cigarettes  . Smokeless tobacco: Never Used  . Tobacco comment: smokes 10 cig daily  Substance and Sexual Activity  . Alcohol use: No    Alcohol/week: 0.0 standard drinks  . Drug use: No  . Sexual activity: Not  Currently    Partners: Male    Birth control/protection: Surgical    Comment: tubal  Other Topics Concern  . Not on file  Social History Narrative  . Not on file   Social Determinants of Health   Financial Resource Strain:   . Difficulty of Paying Living Expenses: Not on file  Food Insecurity:   . Worried About Programme researcher, broadcasting/film/video in the Last Year: Not on file  . Ran Out of Food in the Last Year: Not on file  Transportation Needs:   . Lack of Transportation (Medical): Not on file  . Lack of Transportation (Non-Medical): Not on file  Physical Activity:   . Days of Exercise per Week: Not on file  . Minutes of Exercise per Session: Not on file  Stress:   . Feeling of Stress : Not on file  Social Connections:   . Frequency of Communication with Friends and Family: Not on file  . Frequency of Social Gatherings with Friends and Family: Not on file  . Attends Religious Services: Not on file  . Active Member of Clubs or Organizations: Not on file  . Attends Banker Meetings: Not on file  . Marital Status: Not on file    Allergies:  Allergies  Allergen Reactions  . Other Nausea Only    Per patient Pain medications, any kind  . Protonix [Pantoprazole Sodium]     Metabolic Disorder Labs: No results found for: HGBA1C, MPG No results found for: PROLACTIN Lab Results  Component Value Date   CHOL 184 02/07/2019   TRIG 170 (H) 02/07/2019   HDL 49 02/07/2019   CHOLHDL 3.8 02/07/2019   VLDL 37 (H) 05/01/2015   LDLCALC 105 (H) 02/07/2019   LDLCALC 129 (H) 11/22/2018   Lab Results  Component Value Date   TSH 0.562 11/22/2018   TSH 0.85 05/01/2015    Therapeutic Level Labs: No results found for: LITHIUM Lab Results  Component Value Date   VALPROATE 88 02/08/2018   VALPROATE 63.9 06/15/2017   No components found for:  CBMZ  Current Medications: Current Outpatient Medications  Medication Sig Dispense Refill  . ALPRAZolam (XANAX) 1 MG tablet Take 1 tablet  (1 mg total) by mouth 3 (three) times daily as needed for anxiety. 90 tablet 2  . aspirin-acetaminophen-caffeine (EXCEDRIN MIGRAINE) 250-250-65 MG tablet Take 2 tablets by mouth every 8 (eight) hours as needed for headache.     Marland Kitchen azithromycin (ZITHROMAX) 250 MG tablet Take 2 tablets day 1 and 1 tablet day 2 -5 6 tablet 0  . bisacodyl (DULCOLAX) 5 MG EC tablet Take 5 mg by mouth daily as needed for moderate constipation. 2-3 times per week    . divalproex (DEPAKOTE ER) 500 MG 24 hr tablet Take 2 tablets (1,000 mg total) by mouth at bedtime. 60 tablet 2  . esomeprazole (NEXIUM) 40 MG capsule 1 PO 30  MINUTES PRIOR TO MEALS BID for 3 mos then 1 po qd (Patient taking differently: Take 40 mg by mouth 2 (two) times daily before a meal. 1 PO 30 MINUTES PRIOR TO MEALS BID for 3 mos then 1 po qd) 60 capsule 11  . Multiple Vitamins-Minerals (ONE-A-DAY MENOPAUSE FORMULA PO) Take 1 tablet by mouth daily.    . QUEtiapine (SEROQUEL) 200 MG tablet Take 1 tablet (200 mg total) by mouth at bedtime. 30 tablet 2  . rosuvastatin (CRESTOR) 5 MG tablet TAKE (1) TABLET BY MOUTH ONCE DAILY FOR CHOLESTEROL. 30 tablet 0   No current facility-administered medications for this visit.     Musculoskeletal: Strength & Muscle Tone: within normal limits Gait & Station: normal Patient leans: N/A  Psychiatric Specialty Exam: Review of Systems  All other systems reviewed and are negative.   Last menstrual period 04/13/2015.There is no height or weight on file to calculate BMI.  General Appearance: NA  Eye Contact:  NA  Speech:  Clear and Coherent  Volume:  Normal  Mood:  Euthymic  Affect:  Appropriate and Congruent  Thought Process:  Goal Directed  Orientation:  Full (Time, Place, and Person)  Thought Content: Rumination   Suicidal Thoughts:  No  Homicidal Thoughts:  No  Memory:  Immediate;   Good Recent;   Good Remote;   Good  Judgement:  Good  Insight:  Good  Psychomotor Activity:  Normal  Concentration:   Concentration: Good and Attention Span: Good  Recall:  Good  Fund of Knowledge: Good  Language: Good  Akathisia:  No  Handed:  Right  AIMS (if indicated): not done  Assets:  Communication Skills Desire for Improvement Physical Health Resilience Social Support Talents/Skills  ADL's:  Intact  Cognition: WNL  Sleep:  Fair   Screenings: PHQ2-9     Office Visit from 01/31/2017 in [facility]  PHQ-2 Total Score 0       Assessment and Plan: Patient is a 54 year old female with a history of bipolar disorder and anxiety and insomnia.  She is doing well on her current regimen.  She will continue Seroquel 200 mg at bedtime for mood stabilization and sleep, Xanax 1 mg 3 times daily as needed for anxiety and Depakote ER 1000 mg at bedtime for mood stabilization.  She will return to see me in 3 months   Anne Ruder, MD 10/24/2019, 4:16 PM

## 2019-10-25 ENCOUNTER — Other Ambulatory Visit: Payer: Self-pay | Admitting: Family Medicine

## 2019-11-01 ENCOUNTER — Ambulatory Visit: Payer: PRIVATE HEALTH INSURANCE | Admitting: Physician Assistant

## 2019-11-08 ENCOUNTER — Ambulatory Visit (HOSPITAL_COMMUNITY)
Admission: RE | Admit: 2019-11-08 | Discharge: 2019-11-08 | Disposition: A | Discharge status: Home / Self Care | Payer: PRIVATE HEALTH INSURANCE | Source: Ambulatory Visit | Attending: Family Medicine | Admitting: Family Medicine

## 2019-11-08 ENCOUNTER — Other Ambulatory Visit: Payer: Self-pay

## 2019-11-08 DIAGNOSIS — Z1231 Encounter for screening mammogram for malignant neoplasm of breast: Secondary | ICD-10-CM | POA: Diagnosis not present

## 2019-11-09 LAB — FERRITIN: Ferritin: 72 ng/mL (ref 15–150)

## 2019-11-09 LAB — HEMOGLOBIN A1C
Est. average glucose Bld gHb Est-mCnc: 123 mg/dL
Hgb A1c MFr Bld: 5.9 % — ABNORMAL HIGH (ref 4.8–5.6)

## 2019-11-09 LAB — VITAMIN B12: Vitamin B-12: 766 pg/mL (ref 232–1245)

## 2019-11-14 ENCOUNTER — Encounter: Payer: Self-pay | Admitting: Gastroenterology

## 2019-11-14 ENCOUNTER — Other Ambulatory Visit: Payer: Self-pay

## 2019-11-14 ENCOUNTER — Telehealth (INDEPENDENT_AMBULATORY_CARE_PROVIDER_SITE_OTHER): Payer: PRIVATE HEALTH INSURANCE | Admitting: Gastroenterology

## 2019-11-14 DIAGNOSIS — K5900 Constipation, unspecified: Secondary | ICD-10-CM

## 2019-11-14 DIAGNOSIS — K219 Gastro-esophageal reflux disease without esophagitis: Secondary | ICD-10-CM | POA: Diagnosis not present

## 2019-11-14 MED ORDER — RABEPRAZOLE SODIUM 20 MG PO TBEC: 20.0000 mg | DELAYED_RELEASE_TABLET | Freq: Every day | ORAL | 5 refills | Status: DC

## 2019-11-14 MED ORDER — LINACLOTIDE 145 MCG PO CAPS: 145.0000 ug | ORAL_CAPSULE | Freq: Every day | ORAL | 5 refills | Status: DC

## 2019-11-14 NOTE — Progress Notes (Signed)
Primary Care Physician:  Annalee Genta, DO Primary GI:  Hennie Duos. Marletta Lor, DO Patient Location: Home Provider Location: [facility] office  Reason for Visit:  Chief Complaint  Patient presents with  . Constipation    off/on  . Nausea    off/on, no vomiting  . Abdominal Pain    flare ups occas   Persons present on the virtual encounter, with roles: Patient, myself (provider),Mindy Estudillo CMA (updated meds and allergies)  Total time (minutes) spent on medical discussion: 10 minutes  Due to COVID-19, visit was conducted using Mychart video method.  Visit was requested by patient.  Virtual Visit via Mychart video  I connected with [name] on 11/14/19 at  3:30 PM EDT by Mychart video and verified that I am speaking with the correct person using two identifiers.   I discussed the limitations, risks, security and privacy concerns of performing an evaluation and management service by telephone/video and the availability of in person appointments. I also discussed with the patient that there may be a patient responsible charge related to this service. The patient expressed understanding and agreed to proceed.   HPI:   Anne Logan is a 54 y.o. female who presents for virtual visit regarding constipation, abdominal pain, nausea.  Virtual visit was last completed in May 2021.  EGD and colonoscopy January 2021:  Benign-appearing esophageal stricture status post dilation. Gastritis but no H. pylori.  On colonoscopy she had significant looping of the colon, internal hemorrhoids, moderate diverticulosis with restricted mobility of the rectosigmoid colon. Recommended to have a 5-year surveillance colonoscopy due to prep, polyps less than 3 mm could have been missed.  Tried to start on Trulance after last office visit but insurance denied medication. Never tried Linzess, doesn't remember picking up from pharmacy.  Abdominal swelling and pain. Intermittent every day. With or  without food. Not sure if Nexium is helping or not. Some days worse than others. Alternating between constipation and diarrhea, once or twice per week with diarrhea but mostly constipation. Will use dulcolax or magnesium citrate. No melena, brbpr.    Current Outpatient Medications  Medication Sig Dispense Refill  . ALPRAZolam (XANAX) 1 MG tablet Take 1 tablet (1 mg total) by mouth 3 (three) times daily as needed for anxiety. 90 tablet 2  . aspirin-acetaminophen-caffeine (EXCEDRIN MIGRAINE) 250-250-65 MG tablet Take 2 tablets by mouth every 8 (eight) hours as needed for headache.     . bisacodyl (DULCOLAX) 5 MG EC tablet Take 5 mg by mouth daily as needed for moderate constipation. 2-3 times per week    . divalproex (DEPAKOTE ER) 500 MG 24 hr tablet Take 2 tablets (1,000 mg total) by mouth at bedtime. 60 tablet 2  . esomeprazole (NEXIUM) 40 MG capsule 1 PO 30 MINUTES PRIOR TO MEALS BID for 3 mos then 1 po qd (Patient taking differently: Take 40 mg by mouth 2 (two) times daily before a meal. 1 PO 30 MINUTES PRIOR TO MEALS BID for 3 mos then 1 po qd) 60 capsule 11  . Multiple Vitamins-Minerals (ONE-A-DAY MENOPAUSE FORMULA PO) Take 1 tablet by mouth daily.    . QUEtiapine (SEROQUEL) 200 MG tablet Take 1 tablet (200 mg total) by mouth at bedtime. 30 tablet 2  . rosuvastatin (CRESTOR) 5 MG tablet TAKE (1) TABLET BY MOUTH ONCE DAILY FOR CHOLESTEROL. 30 tablet 3   No current facility-administered medications for this visit.    Past Medical History:  Diagnosis Date  .  Anxiety   . Depression   . GERD (gastroesophageal reflux disease)     Past Surgical History:  Procedure Laterality Date  . BIOPSY  01/23/2019   Procedure: BIOPSY;  Surgeon: West Bali, MD;  Location: [facility];  Service: Endoscopy;;  duodenum gastric  . COLONOSCOPY WITH PROPOFOL N/A 01/23/2019   Dr. Darrick Penna: Significant looping of the colon, internal hemorrhoids, moderate diverticulosis with restricted mobility of the  rectosigmoid colon.  Recommended 5-year follow-up colonoscopy due to prep, polyps less than 3 mm could have been missed.  . ESOPHAGOGASTRODUODENOSCOPY (EGD) WITH PROPOFOL N/A 01/23/2019   Dr. Darrick Penna: Benign-appearing esophageal stricture status post dilation.  Gastritis, no H. pylori.  Small bowel biopsies benign, no celiac.  Marland Kitchen SAVORY DILATION N/A 01/23/2019   Procedure: SAVORY DILATION;  Surgeon: West Bali, MD;  Location: [facility];  Service: Endoscopy;  Laterality: N/A;  . TUBAL LIGATION Bilateral 2005    Family History  Problem Relation Age of Onset  . Depression Mother   . Cancer Mother        lymphoma  . Alcohol abuse Father   . Drug abuse Son   . Colon cancer Neg Hx     Social History   Socioeconomic History  . Marital status: Married    Spouse name: Not on file  . Number of children: Not on file  . Years of education: Not on file  . Highest education level: Not on file  Occupational History  . Not on file  Tobacco Use  . Smoking status: Current Every Day Smoker    Packs/day: 0.50    Years: 20.00    Pack years: 10.00    Types: Cigarettes  . Smokeless tobacco: Never Used  . Tobacco comment: smokes 10 cig daily  Substance and Sexual Activity  . Alcohol use: No    Alcohol/week: 0.0 standard drinks  . Drug use: No  . Sexual activity: Not Currently    Partners: Male    Birth control/protection: Surgical    Comment: tubal  Other Topics Concern  . Not on file  Social History Narrative  . Not on file   Social Determinants of Health   Financial Resource Strain:   . Difficulty of Paying Living Expenses: Not on file  Food Insecurity:   . Worried About Programme researcher, broadcasting/film/video in the Last Year: Not on file  . Ran Out of Food in the Last Year: Not on file  Transportation Needs:   . Lack of Transportation (Medical): Not on file  . Lack of Transportation (Non-Medical): Not on file  Physical Activity:   . Days of Exercise per Week: Not on file  . Minutes of  Exercise per Session: Not on file  Stress:   . Feeling of Stress : Not on file  Social Connections:   . Frequency of Communication with Friends and Family: Not on file  . Frequency of Social Gatherings with Friends and Family: Not on file  . Attends Religious Services: Not on file  . Active Member of Clubs or Organizations: Not on file  . Attends Banker Meetings: Not on file  . Marital Status: Not on file  Intimate Partner Violence:   . Fear of Current or Ex-Partner: Not on file  . Emotionally Abused: Not on file  . Physically Abused: Not on file  . Sexually Abused: Not on file      ROS:  General: Negative for anorexia, weight loss, fever, chills, fatigue, weakness. Eyes: Negative  for vision changes.  ENT: Negative for hoarseness, difficulty swallowing , nasal congestion. CV: Negative for chest pain, angina, palpitations, dyspnea on exertion, peripheral edema.  Respiratory: Negative for dyspnea at rest, dyspnea on exertion, cough, sputum, wheezing.  GI: See history of present illness. GU:  Negative for dysuria, hematuria, urinary incontinence, urinary frequency, nocturnal urination.  MS: Negative for joint pain, low back pain.  Derm: Negative for rash or itching.  Neuro: Negative for weakness, abnormal sensation, seizure, frequent headaches, memory loss, confusion.  Psych: Negative for   suicidal ideation, hallucinations. +anxiety/depression Endo: Negative for unusual weight change.  Heme: Negative for bruising or bleeding. Allergy: Negative for rash or hives.   Observations/Objective: Pleasant well nourished, well developed female in NAD.   Lab Results  Component Value Date   TSH 0.562 11/22/2018    Lab Results  Component Value Date   CREATININE 0.62 09/13/2019   BUN 14 09/13/2019   NA 144 09/13/2019   K 4.2 09/13/2019   CL 105 09/13/2019   CO2 25 09/13/2019   Lab Results  Component Value Date   ALT 11 02/07/2019   AST 16 02/07/2019   ALKPHOS 54  02/07/2019   BILITOT <0.2 02/07/2019   Lab Results  Component Value Date   WBC 5.5 10/25/2018   HGB 14.5 10/25/2018   HCT 41.7 10/25/2018   MCV 93.5 10/25/2018   PLT 246 10/25/2018     Assessment and Plan: Pleasant 54 year old female presenting for follow-up of chronic GERD, constipation.  GERD: Having some refractory symptoms.  Complains of epigastric discomfort.  Endoscopy earlier this year with gastritis.  Is on Nexium twice daily but not really sure is helping.  Pantoprazole listed as an allergy, patient previously reported sore tongue after taking pantoprazole for 2 days back in 2020.  Has tolerated Nexium.  Stop Nexium.  Try AcipHex 20 mg daily.  Constipation: Predominant constipation with occasional diarrhea.  Has been taking magnesium citrate and Dulcolax intermittently.  Failed MiraLAX.  Start Linzess 145 mcg daily.  Return office visit in 6 months.  Call sooner if symptoms do not settle down.  Follow Up Instructions:    I discussed the assessment and treatment plan with the patient. The patient was provided an opportunity to ask questions and all were answered. The patient agreed with the plan and demonstrated an understanding of the instructions. AVS mailed to patient's home address.   The patient was advised to call back or seek an in-person evaluation if the symptoms worsen or if the condition fails to improve as anticipated.  I provided 10 minutes of virtual face-to-face time during this encounter.   Tana Coast, PA-C

## 2019-11-14 NOTE — Patient Instructions (Addendum)
  1. Stop Nexium.  Start AcipHex 20 mg daily before breakfast. 2. Start Linzess 145 mcg daily before breakfast for constipation.  Hold for 24 hours if you develop diarrhea. 3. Call if your symptoms do not settle down.  Otherwise we will see you back in 6 months.

## 2019-11-22 ENCOUNTER — Telehealth: Payer: Self-pay

## 2019-11-22 ENCOUNTER — Telehealth: Payer: Self-pay | Admitting: Internal Medicine

## 2019-11-22 MED ORDER — LUBIPROSTONE 24 MCG PO CAPS: 24.0000 ug | ORAL_CAPSULE | Freq: Two times a day (BID) | ORAL | 3 refills | Status: DC

## 2019-11-22 NOTE — Telephone Encounter (Signed)
That's confusing. In 05/2019 the insurance denied Trulance saying I had to try Linzess. See notes.   I will send in Amitiza.

## 2019-11-22 NOTE — Addendum Note (Signed)
Addended by: Tiffany Kocher on: 11/22/2019 04:02 PM   Modules accepted: Orders

## 2019-11-22 NOTE — Telephone Encounter (Signed)
See other telephone note.  

## 2019-11-22 NOTE — Telephone Encounter (Deleted)
PT.S REQUEST FOR LINZESS WAS DENIED BUT THERE ARE SEVERAL MEDICATIONS THE PT HAS NOT TRIED. AMITIZA AND TRULANCE ARE A COUPLE. SHES ONLY TAKEN DULCOLAX A FEW MONTHS AGO FROM WHAT I GATHERED IN HER CHART. PLEASE ADVISE

## 2019-11-22 NOTE — Telephone Encounter (Signed)
OK I will call the pt and advise. Thank you

## 2019-11-22 NOTE — Telephone Encounter (Signed)
Phoned and spoke to the pt advised Amitiza would be sent in for her and she was happy about that.

## 2019-11-22 NOTE — Telephone Encounter (Signed)
Pt saw Tana Coast, PA recently and was put on Linzess. She called today to say that her insurance does not cover and was there anything else to try or did she need a prior authorization and could she get samples if needed. Please advise. [phone]

## 2020-01-24 ENCOUNTER — Other Ambulatory Visit: Payer: Self-pay

## 2020-01-24 ENCOUNTER — Telehealth (INDEPENDENT_AMBULATORY_CARE_PROVIDER_SITE_OTHER): Payer: PRIVATE HEALTH INSURANCE | Admitting: Psychiatry

## 2020-01-24 ENCOUNTER — Encounter (HOSPITAL_COMMUNITY): Payer: Self-pay | Admitting: Psychiatry

## 2020-01-24 DIAGNOSIS — F3162 Bipolar disorder, current episode mixed, moderate: Secondary | ICD-10-CM

## 2020-01-24 MED ORDER — QUETIAPINE FUMARATE 200 MG PO TABS
200.0000 mg | ORAL_TABLET | Freq: Every day | ORAL | 2 refills | Status: DC
Start: 2020-01-24 — End: 2020-04-22

## 2020-01-24 MED ORDER — DIVALPROEX SODIUM ER 500 MG PO TB24
1000.0000 mg | ORAL_TABLET | Freq: Every day | ORAL | 2 refills | Status: DC
Start: 2020-01-24 — End: 2020-04-21

## 2020-01-24 MED ORDER — ALPRAZOLAM 1 MG PO TABS: 1.0000 mg | ORAL_TABLET | Freq: Three times a day (TID) | ORAL | 2 refills | Status: DC | PRN

## 2020-01-24 NOTE — Progress Notes (Signed)
Virtual Visit via Telephone Note  I connected with Anne Logan on 01/24/20 at  3:40 PM EST by telephone and verified that I am speaking with the correct person using two identifiers.  Location: Patient: home Provider: home   I discussed the limitations, risks, security and privacy concerns of performing an evaluation and management service by telephone and the availability of in person appointments. I also discussed with the patient that there may be a patient responsible charge related to this service. The patient expressed understanding and agreed to proceed.    I discussed the assessment and treatment plan with the patient. The patient was provided an opportunity to ask questions and all were answered. The patient agreed with the plan and demonstrated an understanding of the instructions.   The patient was advised to call back or seek an in-person evaluation if the symptoms worsen or if the condition fails to improve as anticipated.  I provided 15 minutes of non-face-to-face time during this encounter.   [name], MD  [facility] MD/PA/NP OP Progress Note  01/24/2020 3:57 PM Anne Logan  MRN:  [id number]  Chief Complaint:  Chief Complaint    Depression; Anxiety; Follow-up     HPI: This patient is a 55 year old married white female lives with her husband in [place].  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  She has one grown son  The patient returns for follow-up regarding bipolar disorder and anxiety after 3 months.  Overall she continues to do well.  Her husband is still dealing with liver cirrhosis chronic pancreatitis and end-stage renal disease on dialysis.  She often has to help them at night and she is not getting enough sleep but she seems to be used to it.  She thinks that her medications are still helpful for mood swings and anxiety.  She denies any manic symptoms.  Visit Diagnosis:    ICD-10-CM   1. Bipolar 1 disorder, mixed,  moderate (HCC)  F31.62     Past Psychiatric History: Hospitalization many years ago.  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 Anxiety   . Depression   . GERD (gastroesophageal reflux disease)     Past Surgical History:  Procedure Laterality Date  . BIOPSY  01/23/2019   Procedure: BIOPSY;  Surgeon: West Bali, MD;  [facility];  Service: Endoscopy;;  duodenum gastric  . COLONOSCOPY WITH PROPOFOL N/A 01/23/2019   Dr. Darrick Penna: Significant looping of the colon, internal hemorrhoids, moderate diverticulosis with restricted mobility of the rectosigmoid colon.  Recommended 5-year follow-up colonoscopy due to prep, polyps less than 3 mm could have been missed.  . ESOPHAGOGASTRODUODENOSCOPY (EGD) WITH PROPOFOL N/A 01/23/2019   Dr. Darrick Penna: Benign-appearing esophageal stricture status post dilation.  Gastritis, no H. pylori.  Small bowel biopsies benign, no celiac.  Marland Kitchen SAVORY DILATION N/A 01/23/2019   Procedure: SAVORY DILATION;  Surgeon: West Bali, MD;  Location: [facility];  Service: Endoscopy;  Laterality: N/A;  . TUBAL LIGATION Bilateral 2005    Family Psychiatric History: see below  Family History:  Family History  Problem Relation Age of Onset  . Depression Mother   . Cancer Mother        lymphoma  . Alcohol abuse Father   . Drug abuse Son   . Colon cancer Neg Hx     Social History:  Social History   Socioeconomic  History  . Marital status: Married    Spouse name: Not on file  . Number of children: Not on file  . Years of education: Not on file  . Highest education level: Not on file  Occupational History  . Not on file  Tobacco Use  . Smoking status: Current Every Day Smoker    Packs/day: 0.50    Years: 20.00    Pack years: 10.00    Types: Cigarettes  . Smokeless tobacco: Never Used  . Tobacco comment: smokes 10 cig daily  Substance and Sexual Activity  . Alcohol use: No     Alcohol/week: 0.0 standard drinks  . Drug use: No  . Sexual activity: Not Currently    Partners: Male    Birth control/protection: Surgical    Comment: tubal  Other Topics Concern  . Not on file  Social History Narrative  . Not on file   Social Determinants of Health   Financial Resource Strain: Not on file  Food Insecurity: Not on file  Transportation Needs: Not on file  Physical Activity: Not on file  Stress: Not on file  Social Connections: Not on file    Allergies:  Allergies  Allergen Reactions  . Other Nausea Only    Per patient Pain medications, any kind  . Protonix [Pantoprazole Sodium]     Patient reported sore tongue after 2 days of pantoprazole.  No swelling reported.    Metabolic Disorder Labs: Lab Results  Component Value Date   HGBA1C 5.9 (H) 11/08/2019   No results found for: PROLACTIN Lab Results  Component Value Date   CHOL 184 02/07/2019   TRIG 170 (H) 02/07/2019   HDL 49 02/07/2019   CHOLHDL 3.8 02/07/2019   VLDL 37 (H) 05/01/2015   LDLCALC 105 (H) 02/07/2019   LDLCALC 129 (H) 11/22/2018   Lab Results  Component Value Date   TSH 0.562 11/22/2018   TSH 0.85 05/01/2015    Therapeutic Level Labs: No results found for: LITHIUM Lab Results  Component Value Date   VALPROATE 88 02/08/2018   VALPROATE 63.9 06/15/2017   No components found for:  CBMZ  Current Medications: Current Outpatient Medications  Medication Sig Dispense Refill  . ALPRAZolam (XANAX) 1 MG tablet Take 1 tablet (1 mg total) by mouth 3 (three) times daily as needed for anxiety. 90 tablet 2  . aspirin-acetaminophen-caffeine (EXCEDRIN MIGRAINE) 250-250-65 MG tablet Take 2 tablets by mouth every 8 (eight) hours as needed for headache.     . bisacodyl (DULCOLAX) 5 MG EC tablet Take 5 mg by mouth daily as needed for moderate constipation. 2-3 times per week    . divalproex (DEPAKOTE ER) 500 MG 24 hr tablet Take 2 tablets (1,000 mg total) by mouth at bedtime. 60 tablet 2  .  lubiprostone (AMITIZA) 24 MCG capsule Take 1 capsule (24 mcg total) by mouth 2 (two) times daily with a meal. 60 capsule 3  . Multiple Vitamins-Minerals (ONE-A-DAY MENOPAUSE FORMULA PO) Take 1 tablet by mouth daily.    . QUEtiapine (SEROQUEL) 200 MG tablet Take 1 tablet (200 mg total) by mouth at bedtime. 30 tablet 2  . RABEprazole (ACIPHEX) 20 MG tablet Take 1 tablet (20 mg total) by mouth daily before breakfast. 30 tablet 5  . rosuvastatin (CRESTOR) 5 MG tablet TAKE (1) TABLET BY MOUTH ONCE DAILY FOR CHOLESTEROL. 30 tablet 3   No current facility-administered medications for this visit.     Musculoskeletal: Strength & Muscle Tone: within normal limits Gait &  Station: normal Patient leans: N/A  Psychiatric Specialty Exam: Review of Systems  Psychiatric/Behavioral: Positive for sleep disturbance.  All other systems reviewed and are negative.   Last menstrual period 04/13/2015.There is no height or weight on file to calculate BMI.  General Appearance: NA  Eye Contact:  NA  Speech:  Clear and Coherent  Volume:  Normal  Mood:  Euthymic  Affect:  NA  Thought Process:  Goal Directed  Orientation:  Full (Time, Place, and Person)  Thought Content: Rumination   Suicidal Thoughts:  No  Homicidal Thoughts:  No  Memory:  Immediate;   Good Recent;   Good Remote;   Good  Judgement:  Good  Insight:  Good  Psychomotor Activity:  Normal  Concentration:  Concentration: Good and Attention Span: Good  Recall:  Good  Fund of Knowledge: Good  Language: Good  Akathisia:  No  Handed:  Right  AIMS (if indicated): not done  Assets:  Communication Skills Desire for Improvement Physical Health Resilience Social Support Talents/Skills  ADL's:  Intact  Cognition: WNL  Sleep:  Poor   Screenings: PHQ2-9   Flowsheet Row Office Visit from 01/31/2017 in [facility]  PHQ-2 Total Score 0       Assessment and Plan: This patient is a 55 year old female with a history of bipolar  disorder anxiety and insomnia.  She is not sleeping very well but this seems to be common for her and she does not really want any medication changes right now.  She will continue Seroquel 200 mg at bed time for mood stabilization of sleep, Xanax 1 mg 3 times daily as needed for anxiety and Depakote ER 1000 mg at bedtime for mood stabilization.  She has not had lab work in about a year so we will check a Depakote level and liver function tests.  She will return to see me in 3 months   [name], MD 01/24/2020, 3:57 PM

## 2020-01-29 ENCOUNTER — Other Ambulatory Visit (HOSPITAL_COMMUNITY): Payer: Self-pay | Admitting: Psychiatry

## 2020-01-29 DIAGNOSIS — F3162 Bipolar disorder, current episode mixed, moderate: Secondary | ICD-10-CM

## 2020-02-01 LAB — HEPATIC FUNCTION PANEL
AG Ratio: 2.1 (calc) (ref 1.0–2.5)
ALT: 8 U/L (ref 6–29)
AST: 13 U/L (ref 10–35)
Albumin: 4.2 g/dL (ref 3.6–5.1)
Alkaline phosphatase (APISO): 53 U/L (ref 37–153)
Bilirubin, Direct: 0 mg/dL (ref 0.0–0.2)
Globulin: 2 g/dL (calc) (ref 1.9–3.7)
Indirect Bilirubin: 0.2 mg/dL (calc) (ref 0.2–1.2)
Total Bilirubin: 0.2 mg/dL (ref 0.2–1.2)
Total Protein: 6.2 g/dL (ref 6.1–8.1)

## 2020-02-01 LAB — VALPROIC ACID LEVEL: Valproic Acid Lvl: 69 mg/L (ref 50.0–100.0)

## 2020-02-01 NOTE — Progress Notes (Signed)
Please let her know that labs are normal

## 2020-02-08 ENCOUNTER — Encounter: Payer: PRIVATE HEALTH INSURANCE | Admitting: Physician Assistant

## 2020-02-25 ENCOUNTER — Other Ambulatory Visit: Payer: Self-pay | Admitting: Family Medicine

## 2020-02-25 NOTE — Telephone Encounter (Signed)
Please schedule visit and then route back to nurses to send in refills 

## 2020-02-26 NOTE — Telephone Encounter (Signed)
Sent my chart message to schedule appointment.

## 2020-02-29 NOTE — Telephone Encounter (Signed)
Appointment 2/24

## 2020-03-13 ENCOUNTER — Ambulatory Visit: Payer: PRIVATE HEALTH INSURANCE | Admitting: Family Medicine

## 2020-04-21 ENCOUNTER — Other Ambulatory Visit (HOSPITAL_COMMUNITY): Payer: Self-pay | Admitting: Psychiatry

## 2020-04-22 ENCOUNTER — Telehealth (INDEPENDENT_AMBULATORY_CARE_PROVIDER_SITE_OTHER): Payer: PRIVATE HEALTH INSURANCE | Admitting: Psychiatry

## 2020-04-22 ENCOUNTER — Other Ambulatory Visit: Payer: Self-pay

## 2020-04-22 ENCOUNTER — Encounter (HOSPITAL_COMMUNITY): Payer: Self-pay | Admitting: Psychiatry

## 2020-04-22 DIAGNOSIS — F3162 Bipolar disorder, current episode mixed, moderate: Secondary | ICD-10-CM

## 2020-04-22 MED ORDER — ALPRAZOLAM 1 MG PO TABS: 1.0000 mg | ORAL_TABLET | Freq: Three times a day (TID) | ORAL | 2 refills | Status: DC | PRN

## 2020-04-22 MED ORDER — DIVALPROEX SODIUM ER 500 MG PO TB24
1000.0000 mg | ORAL_TABLET | Freq: Every day | ORAL | 3 refills | Status: DC
Start: 2020-04-22 — End: 2020-07-02

## 2020-04-22 MED ORDER — QUETIAPINE FUMARATE 200 MG PO TABS
200.0000 mg | ORAL_TABLET | Freq: Every day | ORAL | 2 refills | Status: DC
Start: 2020-04-22 — End: 2020-07-02

## 2020-04-22 NOTE — Progress Notes (Signed)
Virtual Visit via Telephone Note  I connected with Anne Logan on 04/22/20 at  3:00 PM EDT by telephone and verified that I am speaking with the correct person using two identifiers.  Location: Patient: home Provider: office   I discussed the limitations, risks, security and privacy concerns of performing an evaluation and management service by telephone and the availability of in person appointments. I also discussed with the patient that there may be a patient responsible charge related to this service. The patient expressed understanding and agreed to proceed.       I discussed the assessment and treatment plan with the patient. The patient was provided an opportunity to ask questions and all were answered. The patient agreed with the plan and demonstrated an understanding of the instructions.   The patient was advised to call back or seek an in-person evaluation if the symptoms worsen or if the condition fails to improve as anticipated.  I provided 15 minutes of non-face-to-face time during this encounter.   Anne Ruder, MD  [facility] MD/PA/NP OP Progress Note  04/22/2020 3:13 PM Anne Logan  MRN:  [id number]  Chief Complaint:  Chief Complaint    Depression; Anxiety; Follow-up     HPI: This patient is a 55 year old married white female lives with her husband in [place]. 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 She has one grown son  The patient returns for follow-up regarding bipolar disorder and anxiety after 3 months.  She continues to do very well.  She enjoys her job.  She denies severe depression.  Her husband now has liver cirrhosis along with chronic pancreatitis and end-stage renal disease on dialysis.  This is upsetting to her but she tries to take breaks by visiting her mother or going out shopping.  She is sleeping okay but not great and she states that it is always been like this and she does not expect it to change.  She denies  any serious symptoms of depression anxiety self-harm thoughts or suicidal ideation.  She denies any manic symptom Visit Diagnosis:    ICD-10-CM   1. Bipolar 1 disorder, mixed, moderate (HCC)  F31.62     Past Psychiatric History: Hospitalization many years ago.  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 Anxiety   . Depression   . GERD (gastroesophageal reflux disease)     Past Surgical History:  Procedure Laterality Date  . BIOPSY  01/23/2019   Procedure: BIOPSY;  Surgeon: West Bali, MD;  [facility];  Service: Endoscopy;;  duodenum gastric  . COLONOSCOPY WITH PROPOFOL N/A 01/23/2019   Dr. Darrick Penna: Significant looping of the colon, internal hemorrhoids, moderate diverticulosis with restricted mobility of the rectosigmoid colon.  Recommended 5-year follow-up colonoscopy due to prep, polyps less than 3 mm could have been missed.  . ESOPHAGOGASTRODUODENOSCOPY (EGD) WITH PROPOFOL N/A 01/23/2019   Dr. Darrick Penna: Benign-appearing esophageal stricture status post dilation.  Gastritis, no H. pylori.  Small bowel biopsies benign, no celiac.  Marland Kitchen SAVORY DILATION N/A 01/23/2019   Procedure: SAVORY DILATION;  Surgeon: West Bali, MD;  [facility];  Service: Endoscopy;  Laterality: N/A;  . TUBAL LIGATION Bilateral 2005    Family Psychiatric History: see below  Family History:  Family History  Problem Relation Age of Onset  . Depression Mother   . Cancer Mother  lymphoma  . Alcohol abuse Father   . Drug abuse Son   . Colon cancer Neg Hx     Social History:  Social History   Socioeconomic History  . Marital status: Married    Spouse name: Not on file  . Number of children: Not on file  . Years of education: Not on file  . Highest education level: Not on file  Occupational History  . Not on file  Tobacco Use  . Smoking status: Current Every Day Smoker    Packs/day: 0.50    Years:  20.00    Pack years: 10.00    Types: Cigarettes  . Smokeless tobacco: Never Used  . Tobacco comment: smokes 10 cig daily  Substance and Sexual Activity  . Alcohol use: No    Alcohol/week: 0.0 standard drinks  . Drug use: No  . Sexual activity: Not Currently    Partners: Male    Birth control/protection: Surgical    Comment: tubal  Other Topics Concern  . Not on file  Social History Narrative  . Not on file   Social Determinants of Health   Financial Resource Strain: Not on file  Food Insecurity: Not on file  Transportation Needs: Not on file  Physical Activity: Not on file  Stress: Not on file  Social Connections: Not on file    Allergies:  Allergies  Allergen Reactions  . Other Nausea Only    Per patient Pain medications, any kind  . Protonix [Pantoprazole Sodium]     Patient reported sore tongue after 2 days of pantoprazole.  No swelling reported.    Metabolic Disorder Labs: Lab Results  Component Value Date   HGBA1C 5.9 (H) 11/08/2019   No results found for: PROLACTIN Lab Results  Component Value Date   CHOL 184 02/07/2019   TRIG 170 (H) 02/07/2019   HDL 49 02/07/2019   CHOLHDL 3.8 02/07/2019   VLDL 37 (H) 05/01/2015   LDLCALC 105 (H) 02/07/2019   LDLCALC 129 (H) 11/22/2018   Lab Results  Component Value Date   TSH 0.562 11/22/2018   TSH 0.85 05/01/2015    Therapeutic Level Labs: No results found for: LITHIUM Lab Results  Component Value Date   VALPROATE 69.0 01/31/2020   VALPROATE 88 02/08/2018   No components found for:  CBMZ  Current Medications: Current Outpatient Medications  Medication Sig Dispense Refill  . ALPRAZolam (XANAX) 1 MG tablet Take 1 tablet (1 mg total) by mouth 3 (three) times daily as needed for anxiety. 90 tablet 2  . aspirin-acetaminophen-caffeine (EXCEDRIN MIGRAINE) 250-250-65 MG tablet Take 2 tablets by mouth every 8 (eight) hours as needed for headache.     . bisacodyl (DULCOLAX) 5 MG EC tablet Take 5 mg by mouth  daily as needed for moderate constipation. 2-3 times per week    . divalproex (DEPAKOTE ER) 500 MG 24 hr tablet Take 2 tablets (1,000 mg total) by mouth daily. 60 tablet 3  . lubiprostone (AMITIZA) 24 MCG capsule Take 1 capsule (24 mcg total) by mouth 2 (two) times daily with a meal. 60 capsule 3  . Multiple Vitamins-Minerals (ONE-A-DAY MENOPAUSE FORMULA PO) Take 1 tablet by mouth daily.    . QUEtiapine (SEROQUEL) 200 MG tablet Take 1 tablet (200 mg total) by mouth at bedtime. 30 tablet 2  . RABEprazole (ACIPHEX) 20 MG tablet Take 1 tablet (20 mg total) by mouth daily before breakfast. 30 tablet 5  . rosuvastatin (CRESTOR) 5 MG tablet TAKE (1) TABLET BY MOUTH  ONCE DAILY FOR CHOLESTEROL. 30 tablet 0   No current facility-administered medications for this visit.     Musculoskeletal: Strength & Muscle Tone: within normal limits Gait & Station: normal Patient leans: N/A  Psychiatric Specialty Exam: Review of Systems  All other systems reviewed and are negative.   Last menstrual period 04/13/2015.There is no height or weight on file to calculate BMI.  General Appearance: NA  Eye Contact:  NA  Speech:  Clear and Coherent  Volume:  Normal  Mood:  Euthymic  Affect:  NA  Thought Process:  Goal Directed  Orientation:  Full (Time, Place, and Person)  Thought Content: WDL   Suicidal Thoughts:  No  Homicidal Thoughts:  No  Memory:  Immediate;   Good Recent;   Good Remote;   Fair  Judgement:  Good  Insight:  Good  Psychomotor Activity:  Normal  Concentration:  Concentration: Good and Attention Span: Good  Recall:  Good  Fund of Knowledge: Good  Language: Good  Akathisia:  No  Handed:  Right  AIMS (if indicated): not done  Assets:  Communication Skills Desire for Improvement Physical Health Resilience Social Support Talents/Skills  ADL's:  Intact  Cognition: WNL  Sleep:  Good   Screenings: PHQ2-9   Flowsheet Row [facility] Office Visit from 01/31/2017 in [facility]  PHQ-2 Total Score 0 0    Flowsheet Row Video Visit [facility]/22/2020 [facility]  C-SSRS RISK CATEGORY No Risk       Assessment and Plan: This patient is a 55 year old female with a history of bipolar disorder anxiety and insomnia.  She feels like she is doing well on her current regimen.  She will continue Seroquel 200 mg nightly for mood stabilization, Xanax 1 mg 3 times daily as needed for anxiety and Depakote ER 1000 mg at bedtime for mood stabilization.  Last Depakote level was good at 69.  She will return to see me in 3 months   Anne Ruder, MD 04/22/2020, 3:13 PM

## 2020-05-01 ENCOUNTER — Encounter (HOSPITAL_COMMUNITY): Payer: Self-pay

## 2020-05-12 ENCOUNTER — Ambulatory Visit: Payer: PRIVATE HEALTH INSURANCE | Admitting: Family Medicine

## 2020-05-13 ENCOUNTER — Encounter: Payer: PRIVATE HEALTH INSURANCE | Admitting: Physician Assistant

## 2020-05-14 ENCOUNTER — Ambulatory Visit: Payer: PRIVATE HEALTH INSURANCE | Admitting: Gastroenterology

## 2020-05-19 ENCOUNTER — Other Ambulatory Visit: Payer: Self-pay | Admitting: Gastroenterology

## 2020-06-10 NOTE — Progress Notes (Signed)
Referring Provider: Annalee Gentaaylor, Malena M, DO Primary Care Physician:  Annalee Gentaaylor, Malena M, DO Primary GI Physician: Dr. Marletta Lorarver  Chief Complaint  Patient presents with  . Gastroesophageal Reflux  . Constipation    occ  . Diarrhea    occ    HPI:   Anne Logan is a 55 y.o. female presenting today for follow-up of alternating diarrhea and constipation and GERD.  GES normal December 2020.  EGD and colonoscopy January 2021: Benign-appearing esophageal stricture status post dilation. Gastritis but no H. pylori. On colonoscopy she had significant looping of the colon, internal hemorrhoids, moderate diverticulosis with restricted mobility of the rectosigmoid colon.Recommended to have a 5-year surveillance colonoscopy due to prep, polyps less than 3 mm could have been missed.  Previously tried to start Trulance and Linzess for constipation, but insurance denied both.  Last medication sent and was Amitiza 24 mcg twice daily.  Last seen via virtual visit 11/14/2019.  Suspected patient was dealing with refractory GERD symptoms as she was reporting intermittent epigastric discomfort. Not sure if Nexium was helpful.  Alternating between constipation and diarrhea, but mostly with constipation.  Using Dulcolax or magnesium citrate.  No BRBPR or melena.  Plan to stop Nexium and try Aciphex 20 mg daily.  Plan to trial Linzess for constipation, but as per above, insurance denied, and Amitiza 24 mcg twice daily was sent to pharmacy.  Recommended 7971-month follow-up.  Today:  Constipation/Diarrhea: Diarrhea 3 out of 7 days a week. Runs back and forth to the bathroom for 2 hours. Cramping prior to a BM that improves thereafter. Sometimes she has soft stools between diarrhea. Constipation is 2-3 times a month. Hard stools at that time. Red sauce causes diarrhea. Avoiding this. Doesn't take anything for diarrhea or constipation. Insurance wouldn't cover Amitiza. Stress can also trigger diarrhea.  Overall, she  feels since her reflux symptoms have improve, frequency of diarrhea has also improved. Fluctuating bowel habits is chronic. 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 No brbpr or melena.   GERD: Improved since starting rabeprazole 20 mg daily.  Intermittent flareups of epigastric pain a couple times a week.  Associated with nausea but no vomiting.  Is avoiding fried foods, spicy foods, and salads. Intermittent dysphagia to large pills.  No dysphagia to foods.  Previously improved with dilation in January 2021.  Excedrin migraine 3 times daily. 2 tablets each time. Taking for headaches.   Reports chest pain radiating up her neck and to her back. Can last a couple hours. No identified triggers. Not necessarily brought on by activity or stress.  No association with meals or reflux symptoms.  No associated SOB. No palpitations. Feels like a "ton of bricks" is laying on her chest.  Started about 6 months ago and is occurring at least weekly. Would like referral to cardiology.   Past Medical History:  Diagnosis Date  . Anxiety   . Depression   . GERD (gastroesophageal reflux disease)     Past Surgical History:  Procedure Laterality Date  . BIOPSY  01/23/2019   Procedure: BIOPSY;  Surgeon: West BaliFields, Sandi L, MD;  [facility];  Service: Endoscopy;;  duodenum gastric  . COLONOSCOPY WITH PROPOFOL N/A 01/23/2019   Dr. Darrick PennaFields: Significant looping of the colon, internal hemorrhoids, moderate diverticulosis with restricted mobility of the rectosigmoid colon.  Recommended 5-year follow-up colonoscopy due to prep, polyps less than 3 mm could have been missed.  . ESOPHAGOGASTRODUODENOSCOPY (EGD) WITH PROPOFOL N/A 01/23/2019   Dr. Darrick PennaFields: Benign-appearing esophageal  stricture status post dilation.  Gastritis, no H. pylori.  Small bowel biopsies benign, no celiac.  Marland Kitchen SAVORY DILATION N/A 01/23/2019   Procedure: SAVORY DILATION;  Surgeon: West Bali, MD;  [facility];  Service: Endoscopy;  Laterality: N/A;  . TUBAL  LIGATION Bilateral 2005    Current Outpatient Medications  Medication Sig Dispense Refill  . ALPRAZolam (XANAX) 1 MG tablet Take 1 tablet (1 mg total) by mouth 3 (three) times daily as needed for anxiety. 90 tablet 2  . aspirin-acetaminophen-caffeine (EXCEDRIN MIGRAINE) 250-250-65 MG tablet Take 2 tablets by mouth every 8 (eight) hours as needed for headache.     . bisacodyl (DULCOLAX) 5 MG EC tablet Take 5 mg by mouth daily as needed for moderate constipation. 2-3 times per week    . divalproex (DEPAKOTE ER) 500 MG 24 hr tablet Take 2 tablets (1,000 mg total) by mouth daily. 60 tablet 3  . QUEtiapine (SEROQUEL) 200 MG tablet Take 1 tablet (200 mg total) by mouth at bedtime. 30 tablet 2  . RABEprazole (ACIPHEX) 20 MG tablet Take 1 tablet (20 mg total) by mouth 2 (two) times daily before a meal. 60 tablet 3  . rosuvastatin (CRESTOR) 5 MG tablet TAKE (1) TABLET BY MOUTH ONCE DAILY FOR CHOLESTEROL. 30 tablet 0   No current facility-administered medications for this visit.    Allergies as of 06/11/2020 - Review Complete 06/11/2020  Allergen Reaction Noted  . Other Nausea Only 01/02/2019  . Protonix [pantoprazole sodium]  12/18/2018    Family History  Problem Relation Age of Onset  . Depression Mother   . Cancer Mother        lymphoma  . Alcohol abuse Father   . Drug abuse Son   . Colon cancer Neg Hx     Social History   Socioeconomic History  . Marital status: Married    Spouse name: Not on file  . Number of children: Not on file  . Years of education: Not on file  . Highest education level: Not on file  Occupational History  . Not on file  Tobacco Use  . Smoking status: Current Every Day Smoker    Packs/day: 0.50    Years: 20.00    Pack years: 10.00    Types: Cigarettes  . Smokeless tobacco: Never Used  . Tobacco comment: smokes 10 cig daily  Substance and Sexual Activity  . Alcohol use: Not Currently    Alcohol/week: 0.0 standard drinks    Comment: quit 2 years ago   . Drug use: No  . Sexual activity: Not Currently    Partners: Male    Birth control/protection: Surgical    Comment: tubal  Other Topics Concern  . Not on file  Social History Narrative  . Not on file   Social Determinants of Health   Financial Resource Strain: Not on file  Food Insecurity: Not on file  Transportation Needs: Not on file  Physical Activity: Not on file  Stress: Not on file  Social Connections: Not on file    Review of Systems: Gen: Denies fever, chills, cold or flulike symptoms, presyncope, syncope. CV: See HPI Resp: See HPI GI: See HPI Heme: See HPI  Physical Exam: BP 122/79   Pulse 95   Temp (!) 97.3 F (36.3 C) (Temporal)   Ht 5\' 1"  (1.549 m)   Wt 127 lb (57.6 kg)   LMP 04/13/2015   BMI 24.00 kg/m  General:   Alert and oriented. No distress  noted. Pleasant and cooperative.  Head:  Normocephalic and atraumatic. Eyes:  Conjuctiva clear without scleral icterus. Heart:  S1, S2 present without murmurs appreciated. Lungs:  Clear to auscultation bilaterally. No wheezes, rales, or rhonchi. No distress.  Abdomen:  +BS, soft, non-tender and non-distended. No rebound or guarding. No HSM or masses noted. Msk:  Symmetrical without gross deformities. Normal posture. Extremities:  Without edema. Neurologic:  Alert and  oriented x4 Psych: Normal mood and affect.   Assessment: 55 year old female with history of GERD, esophageal stricture s/p dilation in 2021, alternating constipation and diarrhea presenting today for follow-up of GERD, alternating constipation and diarrhea, and also reporting dysphagia and chest pain.  GERD: Improved with Aciphex 20 mg daily. Intermittent "flareups" now a couple times a week with associated epigastric pain and nausea without vomiting.  She is careful with dietary intake.  However, she is consuming a significant amount of Excedrin Migraine.  Suspect this is likely contributing to her refractory upper GI symptoms.  May also have  esophagitis, gastritis, duodenitis, and cannot rule out PUD.  Plan to increase Aciphex to twice daily.  Hold off on EGD while chest pain is being evaluated.  Dysphagia: Dysphagia to large pills.  No trouble with foods or liquids.  EGD in January 2021 with esophageal stricture s/p dilation with clinical improvement. Suspect recurrent esophageal stricture versus esophagitis in the setting of uncontrolled GERD and frequent Excedrin Migraine use.  Would like to pursue EGD, but will hold off on scheduling while chest pain is being evaluated.  Plan to increase PPI to twice daily.  ER precautions discussed.  Alternating constipation and diarrhea: Chronic. Diarrhea is more frequent, occurring 3/7 days a week, associated abdominal cramping prior to bowel movements that improves thereafter.  Typically with soft stools between episodes of diarrhea.  Constipation 2-3 times a month.  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  Interestingly, she actually reports improvement in frequency of diarrhea with better management of GERD.  She is also taking significant amounts of Excedrin Migraine which could be influencing inflammation throughout her GI tract and contributing to her fluctuating bowel habits.  Additional considerations include celiac disease and thyroid abnormalities.  TCS up to date, due for repeat in 2026. We will plan to screen for celiac disease, check TSH, increase PPI to twice daily to better manage GERD symptoms, have her stop Excedrin Migraine, and call with a progress report in 4 weeks.  If no significant improvement, can try dicyclomine as needed.  Headaches: Chronic headaches for which she is taking 6 Excedrin Migraine daily.  She was advised to stop Excedrin Migraine and discuss further evaluation and management with her primary care provider.  Chest pain:  6-week history of intermittent chest pain radiating up her neck and into her back which feels like "a ton of bricks".  No identified trigger.  Not  necessarily brought on by activity or stress.  Denies association with reflux or meals.  Last a couple of hours without associated shortness of breath or heart palpitations.  Will refer to cardiology for further evaluation prior to scheduling EGD.   Plan: 1.  Refer to cardiology for further evaluation of chest pain prior to scheduling an EGD.  2.  TTG IgA, IgA total, TSH  3.  Increase Aciphex to 20 mg twice daily 30 minutes before breakfast and dinner.  4.  Counseled on GERD diet/lifestyle.  Written instructions provided.  5.  Stop Excedrin Migraine and discuss headaches with primary care.  6.  Avoid all  NSAIDs.  May use Tylenol.  No more than 3000 mg/day.  7.  Drink plenty of liquids with meals.  May try swallowing pills with a thicker liquids such as milk or taking them with applesauce or yogurt.  8.  If something gets stuck in her esophagus and will not come up or go down, she is to proceed to the emergency room.  9.  Requested progress report in 4 weeks.  If no improvement in bowel habits despite better management of GERD and discontinuing Excedrin Migraine, will consider low-dose dicyclomine as needed.  10.  Follow-up in 3 months.  May be able to schedule EGD +/- dilation prior to this if she completes evaluation with cardiology. Patient is to call our office if cardiology evaluation is completed prior to next visit.     Ermalinda Memos, PA-C [facility] 06/12/2020

## 2020-06-11 ENCOUNTER — Other Ambulatory Visit: Payer: Self-pay

## 2020-06-11 ENCOUNTER — Encounter: Payer: Self-pay | Admitting: Gastroenterology

## 2020-06-11 ENCOUNTER — Ambulatory Visit (INDEPENDENT_AMBULATORY_CARE_PROVIDER_SITE_OTHER): Payer: PRIVATE HEALTH INSURANCE | Admitting: Gastroenterology

## 2020-06-11 VITALS — BP 122/79 | HR 95 | Temp 97.3°F | Ht 61.0 in | Wt 127.0 lb

## 2020-06-11 DIAGNOSIS — R198 Other specified symptoms and signs involving the digestive system and abdomen: Secondary | ICD-10-CM | POA: Diagnosis not present

## 2020-06-11 DIAGNOSIS — R079 Chest pain, unspecified: Secondary | ICD-10-CM | POA: Diagnosis not present

## 2020-06-11 DIAGNOSIS — G8929 Other chronic pain: Secondary | ICD-10-CM

## 2020-06-11 DIAGNOSIS — K219 Gastro-esophageal reflux disease without esophagitis: Secondary | ICD-10-CM

## 2020-06-11 DIAGNOSIS — R1310 Dysphagia, unspecified: Secondary | ICD-10-CM

## 2020-06-11 DIAGNOSIS — R519 Headache, unspecified: Secondary | ICD-10-CM

## 2020-06-11 MED ORDER — RABEPRAZOLE SODIUM 20 MG PO TBEC
20.0000 mg | DELAYED_RELEASE_TABLET | Freq: Two times a day (BID) | ORAL | 3 refills | Status: DC
Start: 2020-06-11 — End: 2020-10-02

## 2020-06-11 NOTE — Patient Instructions (Addendum)
We will refer you to cardiology to further evaluate your chest pain.  Please have labs completed at [facility].  Increase rabeprazole to 20 mg twice daily 30 minutes before breakfast and dinner.  Follow a GERD diet:  Avoid fried, fatty, greasy, spicy, citrus foods. Avoid caffeine and carbonated beverages. Avoid chocolate. Try eating 4-6 small meals a day rather than 3 large meals. Do not eat within 3 hours of laying down. Prop head of bed up on wood or bricks to create a 6 inch incline.  Alternating constipation and diarrhea: You may have a component of irritable bowel syndrome.  As we discussed, we will try increasing rabeprazole to 20 mg twice daily 30 minutes before breakfast and dinner as you previously noted improvement with this. Please call with a progress report in 4 weeks.  If you continue with intermittent abdominal pain and diarrhea, we can try medication called dicyclomine.  For trouble swallowing: Continue to be careful taking your pills.  Drink plenty of water with your pills. You can try swallowing them with a thicker liquid like milk or taking them with applesauce to see if this will help them go down easier.  If something gets stuck in your and will not come up or go down, you should proceed to the emergency room.   Please stop using Excedrin Migraine.  As we discussed, I suspect this may be contributing to your refractory GI symptoms.  Please make a follow-up appointment with your primary care to discuss your frequent headaches.  This may need further evaluation.  You will also need a different medication to manage this.  In general, you should avoid all NSAIDs including ibuprofen, Aleve, Advil, Goody powders, BC powders, naproxen, and anything that says "NSAID" on the package.  May use Tylenol as needed.  No more than 3000 mg/day.  Ermalinda Memos, PA-C [facility]

## 2020-06-12 ENCOUNTER — Encounter: Payer: Self-pay | Admitting: Gastroenterology

## 2020-06-12 DIAGNOSIS — R519 Headache, unspecified: Secondary | ICD-10-CM | POA: Insufficient documentation

## 2020-06-17 LAB — IGA: Immunoglobulin A: 105 mg/dL (ref 47–310)

## 2020-06-17 LAB — TISSUE TRANSGLUTAMINASE, IGA: (tTG) Ab, IgA: <1 U/mL

## 2020-06-17 LAB — TSH: TSH: 0.58 mIU/L

## 2020-07-01 ENCOUNTER — Telehealth (HOSPITAL_COMMUNITY): Payer: Self-pay | Admitting: *Deleted

## 2020-07-01 ENCOUNTER — Ambulatory Visit: Payer: PRIVATE HEALTH INSURANCE | Admitting: Internal Medicine

## 2020-07-01 NOTE — Telephone Encounter (Signed)
Patient is needing refills for all of her medications provider prescribes for her. Patient appt was cancelled due to provider being on medical leave. Message was left to call office to resch appt.  

## 2020-07-02 ENCOUNTER — Other Ambulatory Visit (HOSPITAL_COMMUNITY): Payer: Self-pay | Admitting: Psychiatry

## 2020-07-02 MED ORDER — ALPRAZOLAM 1 MG PO TABS: 1.0000 mg | ORAL_TABLET | Freq: Three times a day (TID) | ORAL | 2 refills | Status: DC | PRN

## 2020-07-02 MED ORDER — DIVALPROEX SODIUM ER 500 MG PO TB24: 1000.0000 mg | ORAL_TABLET | Freq: Every day | ORAL | 3 refills | Status: DC

## 2020-07-02 MED ORDER — QUETIAPINE FUMARATE 200 MG PO TABS: 200.0000 mg | ORAL_TABLET | Freq: Every day | ORAL | 2 refills | Status: DC

## 2020-07-02 NOTE — Telephone Encounter (Signed)
sent 

## 2020-07-03 ENCOUNTER — Encounter: Payer: Self-pay | Admitting: *Deleted

## 2020-07-10 ENCOUNTER — Telehealth: Payer: Self-pay | Admitting: Internal Medicine

## 2020-07-10 NOTE — Telephone Encounter (Signed)
Pt received letter to call office regarding her test results. Please call [phone]

## 2020-07-11 NOTE — Progress Notes (Signed)
The pt returned call and was made aware of results of labs and instructions to follow up with PCP for TSH. Pt agreed. Pt also inquired of referral to Cardiologist. After checking with Darlina Rumpf I gave the pt the facilities number to call to make sure they haven't tried to call her.

## 2020-07-14 NOTE — Telephone Encounter (Signed)
Spoke with the pt regarding here results and to make an appt with her PCP to follow-up for TSH being on the low side of normal. Pt agreed

## 2020-07-21 ENCOUNTER — Other Ambulatory Visit (HOSPITAL_COMMUNITY): Payer: Self-pay | Admitting: Psychiatry

## 2020-07-22 ENCOUNTER — Telehealth (HOSPITAL_COMMUNITY): Payer: Self-pay | Admitting: Psychiatry

## 2020-07-22 NOTE — Telephone Encounter (Signed)
She was given Xanax prescription given on 6/15 with two additional refills. Should be good until 09/01/20. She need to call her pharmacy.

## 2020-07-22 NOTE — Telephone Encounter (Signed)
Patient called in requesting refill for Xanax, patient advised to send refill to [facility] in [place]. Contact # verified, pt requesting confirmation of filled RX.

## 2020-07-28 ENCOUNTER — Telehealth (HOSPITAL_COMMUNITY): Payer: PRIVATE HEALTH INSURANCE | Admitting: Psychiatry

## 2020-08-08 ENCOUNTER — Ambulatory Visit: Payer: PRIVATE HEALTH INSURANCE | Admitting: Gastroenterology

## 2020-08-11 NOTE — Progress Notes (Deleted)
CARDIOLOGY CONSULT NOTE       Patient ID: Anne Logan MRN: [id number] DOB/AGE: 1965/12/07 55 y.o.  Admit date: (Not on file) Referring Physician: Ladona Ridgel Primary Physician: Annalee Genta, DO Primary Cardiologist: New Reason for Consultation: Chest pain  Active Problems:   * No active hospital problems. *   HPI:  55 y.o. referred by Dr Ladona Ridgel for chest pain History of irritable bowel, GERD Bipolar disease with anxiety and depression seeing BH.  CRF;s only include HLD on statin No previously documented CAD, vascular disease 06/11/20 seeing GI complaining of GERD and epigastric pain some improvement with rabeprazole Reported SSCP radiating to neck and back Can last hours. 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   ROS All other systems reviewed and negative except as noted above  Past Medical History:  Diagnosis Date   Anxiety    Depression    GERD (gastroesophageal reflux disease)     Family History  Problem Relation Age of Onset   Depression Mother    Cancer Mother        lymphoma   Alcohol abuse Father    Drug abuse Son    Colon cancer Neg Hx     Social History   Socioeconomic History   Marital status: Married    Spouse name: Not on file   Number of children: Not on file   Years of education: Not on file   Highest education level: Not on file  Occupational History   Not on file  Tobacco Use   Smoking status: Every Day    Packs/day: 0.50    Years: 20.00    Pack years: 10.00    Types: Cigarettes   Smokeless tobacco: Never   Tobacco comments:    smokes 10 cig daily  Substance and Sexual Activity   Alcohol use: Not Currently    Alcohol/week: 0.0 standard drinks    Comment: quit 2 years ago   Drug use: No   Sexual  activity: Not Currently    Partners: Male    Birth control/protection: Surgical    Comment: tubal  Other Topics Concern   Not on file  Social History Narrative   Not on file   Social Determinants of Health   Financial Resource Strain: Not on file  Food Insecurity: Not on file  Transportation Needs: Not on file  Physical Activity: Not on file  Stress: Not on file  Social Connections: Not on file  Intimate Partner Violence: Not on file    Past Surgical History:  Procedure Laterality Date   BIOPSY  01/23/2019   Procedure: BIOPSY;  Surgeon: West Bali, MD;  [facility];  Service: Endoscopy;;  duodenum gastric   COLONOSCOPY WITH PROPOFOL N/A 01/23/2019   Dr. Darrick Penna: Significant looping of the colon, internal hemorrhoids, moderate diverticulosis with restricted mobility of the rectosigmoid colon.  Recommended 5-year follow-up colonoscopy due to prep, polyps less than 3 mm could have been missed.   ESOPHAGOGASTRODUODENOSCOPY (EGD) WITH PROPOFOL N/A 01/23/2019   Dr. Darrick Penna: Benign-appearing esophageal stricture status post dilation.  Gastritis, no H. pylori.  Small bowel biopsies benign, no celiac.   SAVORY DILATION N/A 01/23/2019   Procedure: SAVORY DILATION;  Surgeon: West Bali,  MD;  [facility];  Service: Endoscopy;  Laterality: N/A;   TUBAL LIGATION Bilateral 2005      Current Outpatient Medications:    ALPRAZolam (XANAX) 1 MG tablet, Take 1 tablet (1 mg total) by mouth 3 (three) times daily as needed for anxiety., Disp: 90 tablet, Rfl: 2   aspirin-acetaminophen-caffeine (EXCEDRIN MIGRAINE) 250-250-65 MG tablet, Take 2 tablets by mouth every 8 (eight) hours as needed for headache. , Disp: , Rfl:    bisacodyl (DULCOLAX) 5 MG EC tablet, Take 5 mg by mouth daily as needed for moderate constipation. 2-3 times per week, Disp: , Rfl:    divalproex (DEPAKOTE ER) 500 MG 24 hr tablet, Take 2 tablets (1,000 mg total) by mouth daily., Disp: 60 tablet, Rfl: 3    QUEtiapine (SEROQUEL) 200 MG tablet, Take 1 tablet (200 mg total) by mouth at bedtime., Disp: 30 tablet, Rfl: 2   RABEprazole (ACIPHEX) 20 MG tablet, Take 1 tablet (20 mg total) by mouth 2 (two) times daily before a meal., Disp: 60 tablet, Rfl: 3   rosuvastatin (CRESTOR) 5 MG tablet, TAKE (1) TABLET BY MOUTH ONCE DAILY FOR CHOLESTEROL., Disp: 30 tablet, Rfl: 0    Physical Exam: Last menstrual period 04/13/2015.    Affect appropriate Healthy:  appears stated age HEENT: normal Neck supple with no adenopathy JVP normal no bruits no thyromegaly Lungs clear with no wheezing and good diaphragmatic motion Heart:  S1/S2 no murmur, no rub, gallop or click PMI normal Abdomen: benighn, BS positve, no tenderness, no AAA no bruit.  No HSM or HJR Distal pulses intact with no bruits No edema Neuro non-focal Skin warm and dry No muscular weakness   Labs:   Lab Results  Component Value Date   WBC 5.5 10/25/2018   HGB 14.5 10/25/2018   HCT 41.7 10/25/2018   MCV 93.5 10/25/2018   PLT 246 10/25/2018   No results for input(s): NA, K, CL, CO2, BUN, CREATININE, CALCIUM, PROT, BILITOT, ALKPHOS, ALT, AST, GLUCOSE in the last 168 hours.  Invalid input(s): LABALBU Lab Results  Component Value Date   CKTOTAL 56 09/13/2019    Lab Results  Component Value Date   CHOL 184 02/07/2019   CHOL 207 (H) 11/22/2018   CHOL 195 05/01/2015   Lab Results  Component Value Date   HDL 49 02/07/2019   HDL 43 11/22/2018   HDL 53 05/01/2015   Lab Results  Component Value Date   LDLCALC 105 (H) 02/07/2019   LDLCALC 129 (H) 11/22/2018   LDLCALC 105 05/01/2015   Lab Results  Component Value Date   TRIG 170 (H) 02/07/2019   TRIG 197 (H) 11/22/2018   TRIG 186 (H) 05/01/2015   Lab Results  Component Value Date   CHOLHDL 3.8 02/07/2019   CHOLHDL 4.8 (H) 11/22/2018   CHOLHDL 3.7 05/01/2015   No results found for: LDLDIRECT    Radiology: No results found.  EKG: ***   ASSESSMENT AND PLAN:    Chest Pain:  *** Bipolar:  stable no manic episodes on Seroquel and xanax f/u BH HLD:  on statin labs with primary  IBS:  f/u GI on dulcolax and aciphex has had EGD/colon 2021 benign    ***  Signed: Charlton Haws 08/11/2020, 8:14 AM

## 2020-08-13 ENCOUNTER — Ambulatory Visit: Payer: No Typology Code available for payment source | Admitting: Cardiovascular Disease

## 2020-08-18 ENCOUNTER — Telehealth (HOSPITAL_COMMUNITY): Payer: No Typology Code available for payment source | Admitting: Psychiatry

## 2020-08-21 ENCOUNTER — Telehealth (INDEPENDENT_AMBULATORY_CARE_PROVIDER_SITE_OTHER): Payer: No Typology Code available for payment source | Admitting: Psychiatry

## 2020-08-21 ENCOUNTER — Encounter (HOSPITAL_COMMUNITY): Payer: Self-pay | Admitting: Psychiatry

## 2020-08-21 ENCOUNTER — Other Ambulatory Visit: Payer: Self-pay

## 2020-08-21 DIAGNOSIS — F3162 Bipolar disorder, current episode mixed, moderate: Secondary | ICD-10-CM

## 2020-08-21 MED ORDER — DIVALPROEX SODIUM ER 500 MG PO TB24: 1000.0000 mg | ORAL_TABLET | Freq: Every day | ORAL | 3 refills | Status: DC

## 2020-08-21 MED ORDER — ALPRAZOLAM 1 MG PO TABS: 1.0000 mg | ORAL_TABLET | Freq: Three times a day (TID) | ORAL | 2 refills | Status: DC | PRN

## 2020-08-21 MED ORDER — QUETIAPINE FUMARATE 200 MG PO TABS: 200.0000 mg | ORAL_TABLET | Freq: Every day | ORAL | 2 refills | Status: DC

## 2020-08-21 NOTE — Progress Notes (Signed)
Virtual Visit via Telephone Note  I connected with Anne Logan on 08/21/20 at 10:00 AM EDT by telephone and verified that I am speaking with the correct person using two identifiers.  Location: Patient: home Provider: home office   I discussed the limitations, risks, security and privacy concerns of performing an evaluation and management service by telephone and the availability of in person appointments. I also discussed with the patient that there may be a patient responsible charge related to this service. The patient expressed understanding and agreed to proceed.     I discussed the assessment and treatment plan with the patient. The patient was provided an opportunity to ask questions and all were answered. The patient agreed with the plan and demonstrated an understanding of the instructions.   The patient was advised to call back or seek an in-person evaluation if the symptoms worsen or if the condition fails to improve as anticipated.  I provided 15 minutes of non-face-to-face time during this encounter.   [name], MD  [facility] MD/PA/NP OP Progress Note  08/21/2020 10:37 AM Anne Logan  MRN:  [id number]  Chief Complaint:  Chief Complaint   Depression; Anxiety; Follow-up    HPI: This patient is a 55 year old married white female lives with her husband in [place].  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  She has one grown son   The patient returns for follow-up after 4 months.  She states that her husband has been very ill and his pancreatitis is worse.  He was hospitalized this week because he was unable to eat or drink.  She is working from home so she can keep watch over him.  He is not yet at the point of palliative care but it sounds as if this is close.  Despite this she states that her mood has been fairly stable.  She does not sleep that well because her husband is sick through the night.  She is trying to "hang in there."   She denies serious depression or suicidal ideation Visit Diagnosis:    ICD-10-CM   1. Bipolar 1 disorder, mixed, moderate (HCC)  F31.62       Past Psychiatric History: Hospitalization many years ago.  At that time she was using Xanax and Valium  Past Medical History:  Past Medical History:  Diagnosis Date   Anxiety    Depression    GERD (gastroesophageal reflux disease)     Past Surgical History:  Procedure Laterality Date   BIOPSY  01/23/2019   Procedure: BIOPSY;  Surgeon: West Bali, MD;  [facility];  Service: Endoscopy;;  duodenum gastric   COLONOSCOPY WITH PROPOFOL N/A 01/23/2019   Dr. Darrick Penna: Significant looping of the colon, internal hemorrhoids, moderate diverticulosis with restricted mobility of the rectosigmoid colon.  Recommended 5-year follow-up colonoscopy due to prep, polyps less than 3 mm could have been missed.   ESOPHAGOGASTRODUODENOSCOPY (EGD) WITH PROPOFOL N/A 01/23/2019   Dr. Darrick Penna: Benign-appearing esophageal stricture status post dilation.  Gastritis, no H. pylori.  Small bowel biopsies benign, no celiac.   SAVORY DILATION N/A 01/23/2019   Procedure: SAVORY DILATION;  Surgeon: West Bali, MD;  [facility];  Service: Endoscopy;  Laterality: N/A;   TUBAL LIGATION Bilateral 2005    Family Psychiatric History: see below  Family History:  Family History  Problem Relation Age of Onset   Depression Mother    Cancer Mother  lymphoma   Alcohol abuse Father    Drug abuse Son    Colon cancer Neg Hx     Social History:  Social History   Socioeconomic History   Marital status: Married    Spouse name: Not on file   Number of children: Not on file   Years of education: Not on file   Highest education level: Not on file  Occupational History   Not on file  Tobacco Use   Smoking status: Every Day    Packs/day: 0.50    Years: 20.00    Pack years: 10.00    Types: Cigarettes   Smokeless tobacco: Never   Tobacco comments:     smokes 10 cig daily  Substance and Sexual Activity   Alcohol use: Not Currently    Alcohol/week: 0.0 standard drinks    Comment: quit 2 years ago   Drug use: No   Sexual activity: Not Currently    Partners: Male    Birth control/protection: Surgical    Comment: tubal  Other Topics Concern   Not on file  Social History Narrative   Not on file   Social Determinants of Health   Financial Resource Strain: Not on file  Food Insecurity: Not on file  Transportation Needs: Not on file  Physical Activity: Not on file  Stress: Not on file  Social Connections: Not on file    Allergies:  Allergies  Allergen Reactions   Other Nausea Only    Per patient Pain medications, any kind   Protonix [Pantoprazole Sodium]     Patient reported sore tongue after 2 days of pantoprazole.  No swelling reported.    Metabolic Disorder Labs: Lab Results  Component Value Date   HGBA1C 5.9 (H) 11/08/2019   No results found for: PROLACTIN Lab Results  Component Value Date   CHOL 184 02/07/2019   TRIG 170 (H) 02/07/2019   HDL 49 02/07/2019   CHOLHDL 3.8 02/07/2019   VLDL 37 (H) 05/01/2015   LDLCALC 105 (H) 02/07/2019   LDLCALC 129 (H) 11/22/2018   Lab Results  Component Value Date   TSH 0.58 06/13/2020   TSH 0.562 11/22/2018    Therapeutic Level Labs: No results found for: LITHIUM Lab Results  Component Value Date   VALPROATE 69.0 01/31/2020   VALPROATE 88 02/08/2018   No components found for:  CBMZ  Current Medications: Current Outpatient Medications  Medication Sig Dispense Refill   ALPRAZolam (XANAX) 1 MG tablet Take 1 tablet (1 mg total) by mouth 3 (three) times daily as needed for anxiety. 90 tablet 2   aspirin-acetaminophen-caffeine (EXCEDRIN MIGRAINE) 250-250-65 MG tablet Take 2 tablets by mouth every 8 (eight) hours as needed for headache.      bisacodyl (DULCOLAX) 5 MG EC tablet Take 5 mg by mouth daily as needed for moderate constipation. 2-3 times per week      divalproex (DEPAKOTE ER) 500 MG 24 hr tablet Take 2 tablets (1,000 mg total) by mouth daily. 60 tablet 3   QUEtiapine (SEROQUEL) 200 MG tablet Take 1 tablet (200 mg total) by mouth at bedtime. 90 tablet 2   RABEprazole (ACIPHEX) 20 MG tablet Take 1 tablet (20 mg total) by mouth 2 (two) times daily before a meal. 60 tablet 3   rosuvastatin (CRESTOR) 5 MG tablet TAKE (1) TABLET BY MOUTH ONCE DAILY FOR CHOLESTEROL. 30 tablet 0   No current facility-administered medications for this visit.     Musculoskeletal: Strength & Muscle Tone: within normal limits  Gait & Station: normal Patient leans: N/A  Psychiatric Specialty Exam: Review of Systems  Psychiatric/Behavioral:  The patient is nervous/anxious.   All other systems reviewed and are negative.  Last menstrual period 04/13/2015.There is no height or weight on file to calculate BMI.  General Appearance: NA  Eye Contact:  NA  Speech:  Clear and Coherent  Volume:  Normal  Mood:  Anxious and Euthymic  Affect:  NA  Thought Process:  Goal Directed  Orientation:  Full (Time, Place, and Person)  Thought Content: Rumination   Suicidal Thoughts:  No  Homicidal Thoughts:  No  Memory:  Immediate;   Good Recent;   Good Remote;   Good  Judgement:  Good  Insight:  Good  Psychomotor Activity:  Normal  Concentration:  Concentration: Good and Attention Span: Good  Recall:  Good  Fund of Knowledge: Good  Language: Good  Akathisia:  No  Handed:  Right  AIMS (if indicated): not done  Assets:  Communication Skills Desire for Improvement Physical Health Resilience Social Support Talents/Skills  ADL's:  Intact  Cognition: WNL  Sleep:  Good   Screenings: PHQ2-9    Flowsheet Row [facility] Video Visit from 04/22/2020 in [facility] Office Visit from 01/31/2017 in [facility]  PHQ-2 Total Score 0 0 0      Flowsheet Row  Video Visit from 08/21/2020 in [facility] Video Visit from 04/22/2020 [facility]  [facility] RISK CATEGORY No Risk No Risk        Assessment and Plan: This patient is a 55 year old female with a history of bipolar disorder anxiety and insomnia.  She continues to do well on her current regimen.  She will continue Seroquel 200 mg at bedtime for mood stabilization, Xanax 1 mg 3 times daily as needed for anxiety and Depakote ER 1000 mg at bedtime for mood stabilization.  She will return to see me in 3 months   [name], MD 08/21/2020, 10:37 AM

## 2020-09-08 ENCOUNTER — Ambulatory Visit: Payer: No Typology Code available for payment source | Admitting: Cardiovascular Disease

## 2020-09-09 ENCOUNTER — Ambulatory Visit (INDEPENDENT_AMBULATORY_CARE_PROVIDER_SITE_OTHER): Payer: No Typology Code available for payment source | Admitting: Cardiology

## 2020-09-09 ENCOUNTER — Other Ambulatory Visit: Payer: Self-pay

## 2020-09-09 ENCOUNTER — Encounter: Payer: Self-pay | Admitting: Cardiology

## 2020-09-09 VITALS — BP 124/72 | HR 91 | Ht 61.0 in | Wt 128.0 lb

## 2020-09-09 DIAGNOSIS — R079 Chest pain, unspecified: Secondary | ICD-10-CM

## 2020-09-09 NOTE — Progress Notes (Signed)
Clinical Summary Anne Logan is a 55 y.o.female seen today as a new consult, referre dby PA Harper for chest pain.  Chest pain - occurs daily - tends to occur in the afternoon. Midchest, into bilateral neck. Pressure like feeling, bricks on chest. Can occur at or with activity. No other associated symptoms. Can last a few hours. Not positional - no exertional symptoms.   CAD risk factors: HL, +smoker x 20 years - chronic hip pain, uncomfortable on treadmill.  - 10/2018 aortic atherosclerosis    Past Medical History:  Diagnosis Date   Anxiety    Depression    GERD (gastroesophageal reflux disease)      Allergies  Allergen Reactions   Other Nausea Only    Per patient Pain medications, any kind   Protonix [Pantoprazole Sodium]     Patient reported sore tongue after 2 days of pantoprazole.  No swelling reported.     Current Outpatient Medications  Medication Sig Dispense Refill   ALPRAZolam (XANAX) 1 MG tablet Take 1 tablet (1 mg total) by mouth 3 (three) times daily as needed for anxiety. 90 tablet 2   aspirin-acetaminophen-caffeine (EXCEDRIN MIGRAINE) 250-250-65 MG tablet Take 2 tablets by mouth every 8 (eight) hours as needed for headache.      bisacodyl (DULCOLAX) 5 MG EC tablet Take 5 mg by mouth daily as needed for moderate constipation. 2-3 times per week     divalproex (DEPAKOTE ER) 500 MG 24 hr tablet Take 2 tablets (1,000 mg total) by mouth daily. 60 tablet 3   QUEtiapine (SEROQUEL) 200 MG tablet Take 1 tablet (200 mg total) by mouth at bedtime. 90 tablet 2   RABEprazole (ACIPHEX) 20 MG tablet Take 1 tablet (20 mg total) by mouth 2 (two) times daily before a meal. 60 tablet 3   rosuvastatin (CRESTOR) 5 MG tablet TAKE (1) TABLET BY MOUTH ONCE DAILY FOR CHOLESTEROL. 30 tablet 0   No current facility-administered medications for this visit.     Past Surgical History:  Procedure Laterality Date   BIOPSY  01/23/2019   Procedure: BIOPSY;  Surgeon: West Bali, MD;  [facility];  Service: Endoscopy;;  duodenum gastric   COLONOSCOPY WITH PROPOFOL N/A 01/23/2019   Dr. Darrick Penna: Significant looping of the colon, internal hemorrhoids, moderate diverticulosis with restricted mobility of the rectosigmoid colon.  Recommended 5-year follow-up colonoscopy due to prep, polyps less than 3 mm could have been missed.   ESOPHAGOGASTRODUODENOSCOPY (EGD) WITH PROPOFOL N/A 01/23/2019   Dr. Darrick Penna: Benign-appearing esophageal stricture status post dilation.  Gastritis, no H. pylori.  Small bowel biopsies benign, no celiac.   SAVORY DILATION N/A 01/23/2019   Procedure: SAVORY DILATION;  Surgeon: West Bali, MD;  [facility];  Service: Endoscopy;  Laterality: N/A;   TUBAL LIGATION Bilateral 2005     Allergies  Allergen Reactions   Other Nausea Only    Per patient Pain medications, any kind   Protonix [Pantoprazole Sodium]     Patient reported sore tongue after 2 days of pantoprazole.  No swelling reported.      Family History  Problem Relation Age of Onset   Depression Mother    Cancer Mother        lymphoma   Alcohol abuse Father    Drug abuse Son    Colon cancer Neg Hx      Social History Anne Logan reports that she has been smoking cigarettes. She has a 10.00 pack-year smoking  history. She has never used smokeless tobacco. Anne Logan reports that she does not currently use alcohol.   Review of Systems CONSTITUTIONAL: No weight loss, fever, chills, weakness or fatigue.  HEENT: Eyes: No visual loss, blurred vision, double vision or yellow sclerae.No hearing loss, sneezing, congestion, runny nose or sore throat.  SKIN: No rash or itching.  CARDIOVASCULAR: per hpi RESPIRATORY: No shortness of breath, cough or sputum.  GASTROINTESTINAL: No anorexia, nausea, vomiting or diarrhea. No abdominal pain or blood.  GENITOURINARY: No burning on urination, no polyuria NEUROLOGICAL: No headache, dizziness, syncope, paralysis, ataxia,  numbness or tingling in the extremities. No change in bowel or bladder control.  MUSCULOSKELETAL: No muscle, back pain, joint pain or stiffness.  LYMPHATICS: No enlarged nodes. No history of splenectomy.  PSYCHIATRIC: No history of depression or anxiety.  ENDOCRINOLOGIC: No reports of sweating, cold or heat intolerance. No polyuria or polydipsia.  Marland Kitchen   Physical Examination Today's Vitals   09/09/20 1417  BP: 124/72  Pulse: 91  SpO2: 97%  Weight: 128 lb (58.1 kg)  Height: 5\' 1"  (1.549 m)   Body mass index is 24.19 kg/m.  Gen: resting comfortably, no acute distress HEENT: no scleral icterus, pupils equal round and reactive, no palptable cervical adenopathy,  CV: RRR, no m/r/g no jvd Resp: Clear to auscultation bilaterally GI: abdomen is soft, non-tender, non-distended, normal bowel sounds, no hepatosplenomegaly MSK: extremities are warm, no edema.  Skin: warm, no rash Neuro:  no focal deficits Psych: appropriate affect       Assessment and Plan  Chest pain -somewhat atypical symptoms however long smoking history, hyperlipidemia, aortic atherosclerosis on prior CT increases potential for ischemic heart disease - plan for lexiscan to further evaluate, chronic hip pain unable to run on treadmill - EKG today shows SR, no ischemic changes.    F/u pending stress results, if normal study f/u just as needed   , M.D.

## 2020-09-09 NOTE — Patient Instructions (Signed)
Medication Instructions:  Your physician recommends that you continue on your current medications as directed. Please refer to the Current Medication list given to you today.  *If you need a refill on your cardiac medications before your next appointment, please call your pharmacy*   Lab Work: None If you have labs (blood work) drawn today and your tests are completely normal, you will receive your results only by: MyChart Message (if you have MyChart) OR A paper copy in the mail If you have any lab test that is abnormal or we need to change your treatment, we will call you to review the results.   Testing/Procedures: Your physician has requested that you have a lexiscan myoview. For further information please visit [email or web address] Please follow instruction sheet, as given.    Follow-Up: At [facility], you and your health needs are our priority.  As part of our continuing mission to provide you with exceptional heart care, we have created designated Provider Care Teams.  These Care Teams include your primary Cardiologist (physician) and Advanced Practice Providers (APPs -  Physician Assistants and Nurse Practitioners) who all work together to provide you with the care you need, when you need it.  We recommend signing up for the patient portal called "MyChart".  Sign up information is provided on this After Visit Summary.  MyChart is used to connect with patients for Virtual Visits (Telemedicine).  Patients are able to view lab/test results, encounter notes, upcoming appointments, etc.  Non-urgent messages can be sent to your provider as well.   To learn more about what you can do with MyChart, go to [email or web address].    Your next appointment:   Follow Up: PENDING TEST RESULTS   Other Instructions

## 2020-09-16 ENCOUNTER — Encounter (HOSPITAL_COMMUNITY): Payer: Self-pay

## 2020-09-16 ENCOUNTER — Ambulatory Visit (HOSPITAL_COMMUNITY)
Admission: RE | Admit: 2020-09-16 | Discharge: 2020-09-16 | Disposition: A | Discharge status: Home / Self Care | Payer: No Typology Code available for payment source | Source: Ambulatory Visit | Attending: Cardiology | Admitting: Cardiology

## 2020-09-16 ENCOUNTER — Other Ambulatory Visit: Payer: Self-pay

## 2020-09-16 ENCOUNTER — Encounter (HOSPITAL_COMMUNITY)
Admission: RE | Admit: 2020-09-16 | Discharge: 2020-09-16 | Disposition: A | Discharge status: Home / Self Care | Payer: PRIVATE HEALTH INSURANCE | Source: Ambulatory Visit | Attending: Cardiology | Admitting: Cardiology

## 2020-09-16 DIAGNOSIS — R079 Chest pain, unspecified: Secondary | ICD-10-CM | POA: Diagnosis present

## 2020-09-16 LAB — NM MYOCAR MULTI W/SPECT W/WALL MOTION / EF
LV dias vol: 43 mL (ref 46–106)
LV sys vol: 7 mL
Nuc Stress EF: 85 %
Peak HR: 118 {beats}/min
RATE: 0.3
Rest HR: 85 {beats}/min
Rest Nuclear Isotope Dose: 10.8 mCi
SDS: 0
SRS: 0
SSS: 0
ST Depression (mm): 0 mm
Stress Nuclear Isotope Dose: 28.6 mCi

## 2020-09-16 MED ORDER — TECHNETIUM TC 99M TETROFOSMIN IV KIT
30.0000 | PACK | Freq: Once | INTRAVENOUS | Status: AC | PRN
  Administered 2020-09-16: 28.6 via INTRAVENOUS

## 2020-09-16 MED ORDER — SODIUM CHLORIDE FLUSH 0.9 % IV SOLN
INTRAVENOUS | Status: AC
  Administered 2020-09-16: 10 mL via INTRAVENOUS
  Filled 2020-09-16: qty 10

## 2020-09-16 MED ORDER — REGADENOSON 0.4 MG/5ML IV SOLN
INTRAVENOUS | Status: AC
  Administered 2020-09-16: 0.4 mg via INTRAVENOUS
  Filled 2020-09-16: qty 5

## 2020-09-16 MED ORDER — TECHNETIUM TC 99M TETROFOSMIN IV KIT
10.0000 | PACK | Freq: Once | INTRAVENOUS | Status: AC | PRN
  Administered 2020-09-16: 10.8 via INTRAVENOUS

## 2020-10-01 ENCOUNTER — Telehealth: Payer: Self-pay | Admitting: Gastroenterology

## 2020-10-01 ENCOUNTER — Other Ambulatory Visit: Payer: Self-pay | Admitting: Gastroenterology

## 2020-10-01 DIAGNOSIS — K219 Gastro-esophageal reflux disease without esophagitis: Secondary | ICD-10-CM

## 2020-10-01 NOTE — Telephone Encounter (Signed)
Spoke to pt, EGD/-/+DIL scheduled for 10/28/20 at 7:30am. She no longer has a menstrual cycle. Orders entered. Instructions mailed.

## 2020-10-01 NOTE — Telephone Encounter (Signed)
Patient underwent nuclear medicine stress test 09/16/2020 for evaluation of chest pain which did not reveal any myocardial perfusion defects to indicate scar or ischemia.  LVEF hyperdynamic at 85%.  Dr. Wyline Mood has recommended patient follow-up with PCP to discuss noncardiac causes of her symptoms and follow-up with cardiology as needed.  As she has been cleared by cardiology, we can proceed with an EGD at this time.  I spoke with patient today, and she is ready to arrange EGD.  [facility] clinical pool: Please arrange EGD with possible dilation with propofol with Dr. Marletta Lor.  If we can get this arranged prior to her follow-up visit on 10/26, that would be great. ASA II Dx: Dysphagia, GERD

## 2020-10-02 ENCOUNTER — Other Ambulatory Visit: Payer: Self-pay

## 2020-10-02 NOTE — Telephone Encounter (Signed)
Called [facility], spoke to [place], no PA needed for EGD/DIL. Ref# [id number].

## 2020-10-06 ENCOUNTER — Other Ambulatory Visit (HOSPITAL_COMMUNITY): Payer: Self-pay | Admitting: Family Medicine

## 2020-10-06 DIAGNOSIS — Z1231 Encounter for screening mammogram for malignant neoplasm of breast: Secondary | ICD-10-CM

## 2020-10-14 ENCOUNTER — Encounter: Payer: Self-pay | Admitting: Physician Assistant

## 2020-10-14 ENCOUNTER — Other Ambulatory Visit: Payer: Self-pay

## 2020-10-14 ENCOUNTER — Ambulatory Visit (INDEPENDENT_AMBULATORY_CARE_PROVIDER_SITE_OTHER): Payer: No Typology Code available for payment source | Admitting: Physician Assistant

## 2020-10-14 DIAGNOSIS — Z85820 Personal history of malignant melanoma of skin: Secondary | ICD-10-CM

## 2020-10-14 DIAGNOSIS — Z1283 Encounter for screening for malignant neoplasm of skin: Secondary | ICD-10-CM

## 2020-10-14 DIAGNOSIS — L659 Nonscarring hair loss, unspecified: Secondary | ICD-10-CM | POA: Diagnosis not present

## 2020-10-14 DIAGNOSIS — Z85828 Personal history of other malignant neoplasm of skin: Secondary | ICD-10-CM | POA: Diagnosis not present

## 2020-10-14 DIAGNOSIS — Z86018 Personal history of other benign neoplasm: Secondary | ICD-10-CM

## 2020-10-14 NOTE — Patient Instructions (Signed)
Finasteride Foam can get at [facility].

## 2020-10-14 NOTE — Progress Notes (Signed)
   Follow-Up Visit   Subjective  Anne Logan is a 55 y.o. female who presents for the following: Annual Exam (Here for annual skin exam. No concerns. History of melanoma 2006, history of atypical moles and non mole skin cancers. ). She has been experiencing significant hair loss. She is extremely stressed because her husband is very ill and she is going through menopause. She does have some growth in the front. She is very worried about her hair. She does not have a history of thyroid abnormalities but her last labs indicated that she was on the low side of normal.   The following portions of the chart were reviewed this encounter and updated as appropriate:  Tobacco  Allergies  Meds  Problems  Med Hx  Surg Hx  Fam Hx      Objective  Well appearing patient in no apparent distress; mood and affect are within normal limits.  A full examination was performed including scalp, head, eyes, ears, nose, lips, neck, chest, axillae, abdomen, back, buttocks, bilateral upper extremities, bilateral lower extremities, hands, feet, fingers, toes, fingernails, and toenails. All findings within normal limits unless otherwise noted below.  Head to toe. No atypical nevi No signs of non-mole skin cancer. Dyspigmented scars clear.  Scalp Diffuse hair loss. Scalp exam normal. Negative hair pull.   Assessment & Plan  Screening for malignant neoplasm of skin Head to toe.  Yearly skin examinations.  Nonscarring hair loss Scalp  OTC finasteride. Monitor thyroid function.  Stress management.    I, Jurell Basista, PA-C, have reviewed all documentation's for this visit.  The documentation on 10/14/20 for the exam, diagnosis, procedures and orders are all accurate and complete.

## 2020-10-17 ENCOUNTER — Other Ambulatory Visit (HOSPITAL_COMMUNITY): Payer: Self-pay | Admitting: Psychiatry

## 2020-10-21 ENCOUNTER — Ambulatory Visit: Payer: No Typology Code available for payment source | Admitting: Cardiovascular Disease

## 2020-10-28 ENCOUNTER — Encounter (HOSPITAL_COMMUNITY): Payer: Self-pay

## 2020-10-28 ENCOUNTER — Other Ambulatory Visit: Payer: Self-pay

## 2020-10-28 ENCOUNTER — Ambulatory Visit (HOSPITAL_COMMUNITY)
Admission: RE | Admit: 2020-10-28 | Discharge: 2020-10-28 | Disposition: A | Discharge status: Home / Self Care | Payer: PRIVATE HEALTH INSURANCE | Attending: Internal Medicine | Admitting: Internal Medicine

## 2020-10-28 ENCOUNTER — Ambulatory Visit (HOSPITAL_COMMUNITY): Payer: PRIVATE HEALTH INSURANCE | Admitting: Anesthesiology

## 2020-10-28 ENCOUNTER — Encounter (HOSPITAL_COMMUNITY)
Admission: RE | Disposition: A | Discharge status: Home / Self Care | Payer: Self-pay | Source: Home / Self Care | Attending: Internal Medicine

## 2020-10-28 DIAGNOSIS — Z888 Allergy status to other drugs, medicaments and biological substances status: Secondary | ICD-10-CM | POA: Insufficient documentation

## 2020-10-28 DIAGNOSIS — F17210 Nicotine dependence, cigarettes, uncomplicated: Secondary | ICD-10-CM | POA: Diagnosis not present

## 2020-10-28 DIAGNOSIS — R1310 Dysphagia, unspecified: Secondary | ICD-10-CM | POA: Insufficient documentation

## 2020-10-28 DIAGNOSIS — K2970 Gastritis, unspecified, without bleeding: Secondary | ICD-10-CM | POA: Insufficient documentation

## 2020-10-28 DIAGNOSIS — R12 Heartburn: Secondary | ICD-10-CM | POA: Insufficient documentation

## 2020-10-28 DIAGNOSIS — K222 Esophageal obstruction: Secondary | ICD-10-CM

## 2020-10-28 DIAGNOSIS — Z79899 Other long term (current) drug therapy: Secondary | ICD-10-CM | POA: Insufficient documentation

## 2020-10-28 HISTORY — PX: BALLOON DILATION: SHX5330

## 2020-10-28 HISTORY — PX: ESOPHAGOGASTRODUODENOSCOPY (EGD) WITH PROPOFOL: SHX5813

## 2020-10-28 HISTORY — PX: BIOPSY: SHX5522

## 2020-10-28 SURGERY — ESOPHAGOGASTRODUODENOSCOPY (EGD) WITH PROPOFOL
Anesthesia: General

## 2020-10-28 MED ORDER — PROPOFOL 10 MG/ML IV BOLUS
INTRAVENOUS | Status: DC | PRN
  Administered 2020-10-28: 30 mg via INTRAVENOUS
  Administered 2020-10-28: 120 mg via INTRAVENOUS

## 2020-10-28 MED ORDER — STERILE WATER FOR IRRIGATION IR SOLN
Status: DC | PRN
  Administered 2020-10-28: 100 mL

## 2020-10-28 MED ORDER — LACTATED RINGERS IV SOLN: INTRAVENOUS | Status: DC

## 2020-10-28 MED ORDER — LIDOCAINE HCL (CARDIAC) PF 100 MG/5ML IV SOSY
PREFILLED_SYRINGE | INTRAVENOUS | Status: DC | PRN
  Administered 2020-10-28: 50 mg via INTRAVENOUS

## 2020-10-28 NOTE — Transfer of Care (Signed)
Immediate Anesthesia Transfer of Care Note  Patient: Anne Logan  Procedure(s) Performed: ESOPHAGOGASTRODUODENOSCOPY (EGD) WITH PROPOFOL BALLOON DILATION BIOPSY  Patient Location: Endoscopy Unit  Anesthesia Type:General  Level of Consciousness: drowsy  Airway & Oxygen Therapy: Patient Spontanous Breathing  Post-op Assessment: Report given to RN and Post -op Vital signs reviewed and stable  Post vital signs: Reviewed and stable  Last Vitals:  Vitals Value Taken Time  BP    Temp    Pulse    Resp    SpO2      Last Pain:  Vitals:   10/28/20 0753  TempSrc:   PainSc: 0-No pain         Complications: No notable events documented.

## 2020-10-28 NOTE — Op Note (Signed)
[facility] Patient Name: Anne Logan Procedure Date: 10/28/2020 7:12 AM MRN: [id number] Date of Birth: 09/16/1965 Attending MD: Elon Alas. Abbey Chatters DO CSN: [id number] Age: 55 Admit Type: Outpatient Procedure:                Upper GI endoscopy Indications:              Dysphagia, Heartburn Providers:                Elon Alas. Abbey Chatters, DO, Caprice Kluver, [place]                            Risa Grill, Technician Referring MD:              Medicines:                See the Anesthesia note for documentation of the                            administered medications Complications:            No immediate complications. Estimated Blood Loss:     Estimated blood loss was minimal. Procedure:                Pre-Anesthesia Assessment:                           - The anesthesia plan was to use monitored                            anesthesia care (MAC).                           After obtaining informed consent, the endoscope was                            passed under direct vision. Throughout the                            procedure, the patient's blood pressure, pulse, and                            oxygen saturations were monitored continuously. The                            GIF-H190 ([id number]) scope was introduced through the                            mouth, and advanced to the second part of duodenum.                            The upper GI endoscopy was accomplished without                            difficulty. The patient tolerated the procedure                            well. Scope In: 7:55:25 AM  Scope Out: 8:02:32 AM Total Procedure Duration: 0 hours 7 minutes 7 seconds  Findings:      A mild Schatzki ring was found in the lower third of the esophagus. A       TTS dilator was passed through the scope. Dilation with an 18-19-20 mm       balloon dilator was performed to 20 mm. The dilation site was examined       and showed mild mucosal disruption and moderate improvement in  luminal       narrowing.      Localized mild inflammation characterized by linear erosions was found       in the gastric antrum. Biopsies were taken with a cold forceps for       Helicobacter pylori testing.      The duodenal bulb, first portion of the duodenum and second portion of       the duodenum were normal. Impression:               - Mild Schatzki ring. Dilated.                           - Gastritis. Biopsied.                           - Normal duodenal bulb, first portion of the                            duodenum and second portion of the duodenum. Moderate Sedation:      Per Anesthesia Care Recommendation:           - Patient has a contact number available for                            emergencies. The signs and symptoms of potential                            delayed complications were discussed with the                            patient. Return to normal activities tomorrow.                            Written discharge instructions were provided to the                            patient.                           - Resume previous diet.                           - Continue present medications.                           - Await pathology results.                           - Repeat upper endoscopy PRN for retreatment.                           -  Use Aciphex (rabeprazole) 20 mg PO daily.                           - Return to GI clinic in 6 months. Procedure Code(s):        --- Professional ---                           [phone], Esophagogastroduodenoscopy, flexible,                            transoral; with transendoscopic balloon dilation of                            esophagus (less than 30 mm diameter)                           43239, 59, Esophagogastroduodenoscopy, flexible,                            transoral; with biopsy, single or multiple Diagnosis Code(s):        --- Professional ---                           K22.2, Esophageal obstruction                            K29.70, Gastritis, unspecified, without bleeding                           R13.10, Dysphagia, unspecified                           R12, Heartburn CPT copyright 2019 American Medical Association. All rights reserved. The codes documented in this report are preliminary and upon coder review may  be revised to meet current compliance requirements. Elon Alas. Abbey Chatters, DO [place] [name], DO 10/28/2020 8:06:00 AM This report has been signed electronically. Number of Addenda: 0

## 2020-10-28 NOTE — H&P (Signed)
Primary Care Physician:  Annalee Genta, DO Primary Gastroenterologist:  Dr. Marletta Lor  Pre-Procedure History & Physical: HPI:  Anne Logan is a 55 y.o. female is here for an EGD with possible dilation for GERD, dysphagia, Nausea.   Past Medical History:  Diagnosis Date   Anxiety    Depression    GERD (gastroesophageal reflux disease)     Past Surgical History:  Procedure Laterality Date   BIOPSY  01/23/2019   Procedure: BIOPSY;  Surgeon: West Bali, MD;  Location: [facility];  Service: Endoscopy;;  duodenum gastric   COLONOSCOPY WITH PROPOFOL N/A 01/23/2019   Dr. Darrick Penna: Significant looping of the colon, internal hemorrhoids, moderate diverticulosis with restricted mobility of the rectosigmoid colon.  Recommended 5-year follow-up colonoscopy due to prep, polyps less than 3 mm could have been missed.   ESOPHAGOGASTRODUODENOSCOPY (EGD) WITH PROPOFOL N/A 01/23/2019   Dr. Darrick Penna: Benign-appearing esophageal stricture status post dilation.  Gastritis, no H. pylori.  Small bowel biopsies benign, no celiac.   SAVORY DILATION N/A 01/23/2019   Procedure: SAVORY DILATION;  Surgeon: West Bali, MD;  Location: [facility];  Service: Endoscopy;  Laterality: N/A;   TUBAL LIGATION Bilateral 2005    Prior to Admission medications   Medication Sig Start Date End Date Taking? Authorizing Provider  ALPRAZolam Prudy Feeler) 1 MG tablet Take 1 tablet (1 mg total) by mouth 3 (three) times daily as needed for anxiety. 08/21/20 08/21/21 Yes Myrlene Broker, MD  aspirin-acetaminophen-caffeine (EXCEDRIN MIGRAINE) [phone] MG tablet Take 2 tablets by mouth every 8 (eight) hours as needed for headache.    Yes [provider]  bisacodyl (DULCOLAX) 5 MG EC tablet Take 5 mg by mouth 3 (three) times a week.   Yes [provider]  divalproex (DEPAKOTE ER) 500 MG 24 hr tablet TAKE 2 TABLETS DAILY. Patient taking differently: Take 1,000 mg by mouth at bedtime. 10/20/20  Yes Myrlene Broker, MD   QUEtiapine (SEROQUEL) 200 MG tablet Take 1 tablet (200 mg total) by mouth at bedtime. 08/21/20  Yes Myrlene Broker, MD  RABEprazole (ACIPHEX) 20 MG tablet TAKE (1) TABLET BY MOUTH TWICE DAILY BEFORE BREAKFAST. 10/02/20  Yes Tiffany Kocher, PA-C  rosuvastatin (CRESTOR) 5 MG tablet TAKE (1) TABLET BY MOUTH ONCE DAILY FOR CHOLESTEROL. 02/29/20  Yes Ladona Ridgel, Malena M, DO    Allergies as of 10/01/2020 - Review Complete 09/16/2020  Allergen Reaction Noted   Other Nausea Only 01/02/2019   Protonix [pantoprazole sodium]  12/18/2018    Family History  Problem Relation Age of Onset   Depression Mother    Cancer Mother        lymphoma   Alcohol abuse Father    Drug abuse Son    Colon cancer Neg Hx     Social History   Socioeconomic History   Marital status: Married    Spouse name: Not on file   Number of children: Not on file   Years of education: Not on file   Highest education level: Not on file  Occupational History   Not on file  Tobacco Use   Smoking status: Every Day    Packs/day: 0.50    Years: 20.00    Pack years: 10.00    Types: Cigarettes   Smokeless tobacco: Never   Tobacco comments:    smokes 10 cig daily  Vaping Use   Vaping Use: Never used  Substance and Sexual Activity   Alcohol use: Not Currently  Alcohol/week: 0.0 standard drinks    Comment: quit 2 years ago   Drug use: No   Sexual activity: Not Currently    Partners: Male    Birth control/protection: Surgical    Comment: tubal  Other Topics Concern   Not on file  Social History Narrative   Not on file   Social Determinants of Health   Financial Resource Strain: Not on file  Food Insecurity: Not on file  Transportation Needs: Not on file  Physical Activity: Not on file  Stress: Not on file  Social Connections: Not on file  Intimate Partner Violence: Not on file    Review of Systems: See HPI, otherwise negative ROS  Physical Exam: Vital signs in last 24 hours: Temp:  [98.1 F (36.7 C)]  98.1 F (36.7 C) (10/11 0650) Pulse Rate:  [89] 89 (10/11 0650) Resp:  [13] 13 (10/11 0650) BP: (108)/(75) 108/75 (10/11 0650) SpO2:  [97 %] 97 % (10/11 0650) Weight:  [56.7 kg] 56.7 kg (10/11 0650)   General:   Alert,  Well-developed, well-nourished, pleasant and cooperative in NAD Head:  Normocephalic and atraumatic. Eyes:  Sclera clear, no icterus.   Conjunctiva pink. Ears:  Normal auditory acuity. Nose:  No deformity, discharge,  or lesions. Mouth:  No deformity or lesions, dentition normal. Neck:  Supple; no masses or thyromegaly. Lungs:  Clear throughout to auscultation.   No wheezes, crackles, or rhonchi. No acute distress. Heart:  Regular rate and rhythm; no murmurs, clicks, rubs,  or gallops. Abdomen:  Soft, nontender and nondistended. No masses, hepatosplenomegaly or hernias noted. Normal bowel sounds, without guarding, and without rebound.   Msk:  Symmetrical without gross deformities. Normal posture. Extremities:  Without clubbing or edema. Neurologic:  Alert and  oriented x4;  grossly normal neurologically. Skin:  Intact without significant lesions or rashes. Cervical Nodes:  No significant cervical adenopathy. Psych:  Alert and cooperative. Normal mood and affect.  Impression/Plan: Anne Logan is here for an EGD with possible dilation for GERD, dysphagia, Nausea.   The risks of the procedure including infection, bleed, or perforation as well as benefits, limitations, alternatives and imponderables have been reviewed with the patient. Questions have been answered. All parties agreeable.

## 2020-10-28 NOTE — Anesthesia Postprocedure Evaluation (Signed)
Anesthesia Post Note  Patient: Anne Logan  Procedure(s) Performed: ESOPHAGOGASTRODUODENOSCOPY (EGD) WITH PROPOFOL BALLOON DILATION BIOPSY  Patient location during evaluation: Endoscopy Anesthesia Type: General Level of consciousness: awake and alert and oriented Pain management: pain level controlled Vital Signs Assessment: post-procedure vital signs reviewed and stable Respiratory status: spontaneous breathing and respiratory function stable Cardiovascular status: blood pressure returned to baseline and stable Postop Assessment: no apparent nausea or vomiting Anesthetic complications: no   No notable events documented.   Last Vitals:  Vitals:   10/28/20 0806 10/28/20 0809  BP:  (!) 99/57  Pulse: 82   Resp: 17   Temp: 36.7 C   SpO2: 95%     Last Pain:  Vitals:   10/28/20 0806  TempSrc: Oral  PainSc:                  [name]

## 2020-10-28 NOTE — Anesthesia Preprocedure Evaluation (Addendum)
Anesthesia Evaluation  Patient identified by MRN, date of birth, ID band Patient awake    Reviewed: Allergy & Precautions, NPO status , Patient's Chart, lab work & pertinent test results  Airway Mallampati: I  TM Distance: >3 FB Neck ROM: Full    Dental  (+) Dental Advisory Given, Teeth Intact   Pulmonary Current Smoker and Patient abstained from smoking.,    Pulmonary exam normal breath sounds clear to auscultation       Cardiovascular Exercise Tolerance: Good Normal cardiovascular exam Rhythm:Regular Rate:Normal     Neuro/Psych  Headaches, PSYCHIATRIC DISORDERS Anxiety Depression Bipolar Disorder    GI/Hepatic Neg liver ROS, GERD  Medicated and Controlled,  Endo/Other  negative endocrine ROS  Renal/GU negative Renal ROS     Musculoskeletal negative musculoskeletal ROS (+)   Abdominal   Peds  Hematology negative hematology ROS (+)   Anesthesia Other Findings   Reproductive/Obstetrics negative OB ROS                            Anesthesia Physical Anesthesia Plan  ASA: 2  Anesthesia Plan: General   Post-op Pain Management:    Induction: Intravenous  PONV Risk Score and Plan: TIVA  Airway Management Planned: Nasal Cannula and Natural Airway  Additional Equipment:   Intra-op Plan:   Post-operative Plan:   Informed Consent: I have reviewed the patients History and Physical, chart, labs and discussed the procedure including the risks, benefits and alternatives for the proposed anesthesia with the patient or authorized representative who has indicated his/her understanding and acceptance.     Dental advisory given  Plan Discussed with: CRNA and Surgeon  Anesthesia Plan Comments:         Anesthesia Quick Evaluation

## 2020-10-28 NOTE — Discharge Instructions (Signed)
EGD Discharge instructions Please read the instructions outlined below and refer to this sheet in the next few weeks. These discharge instructions provide you with general information on caring for yourself after you leave the hospital. Your doctor may also give you specific instructions. While your treatment has been planned according to the most current medical practices available, unavoidable complications occasionally occur. If you have any problems or questions after discharge, please call your doctor. ACTIVITY You may resume your regular activity but move at a slower pace for the next 24 hours.  Take frequent rest periods for the next 24 hours.  Walking will help expel (get rid of) the air and reduce the bloated feeling in your abdomen.  No driving for 24 hours (because of the anesthesia (medicine) used during the test).  You may shower.  Do not sign any important legal documents or operate any machinery for 24 hours (because of the anesthesia used during the test).  NUTRITION Drink plenty of fluids.  You may resume your normal diet.  Begin with a light meal and progress to your normal diet.  Avoid alcoholic beverages for 24 hours or as instructed by your caregiver.  MEDICATIONS You may resume your normal medications unless your caregiver tells you otherwise.  WHAT YOU CAN EXPECT TODAY You may experience abdominal discomfort such as a feeling of fullness or "gas" pains.  FOLLOW-UP Your doctor will discuss the results of your test with you.  SEEK IMMEDIATE MEDICAL ATTENTION IF ANY OF THE FOLLOWING OCCUR: Excessive nausea (feeling sick to your stomach) and/or vomiting.  Severe abdominal pain and distention (swelling).  Trouble swallowing.  Temperature over 101 F (37.8 C).  Rectal bleeding or vomiting of blood.    Your EGD revealed mild amount inflammation in your stomach.  I took biopsies of this to rule out infection with a bacteria called H. pylori.  Await pathology results, my  office will contact you.  You did have a tightening of your esophagus which I stretched with a balloon.  Hopefully this helps with your swallowing.  Continue on Aciphex daily.  This medication works best if you take it 30 minutes before breakfast.  Follow-up with GI in 6 months.  It was nice meeting you today.   I hope you have a great rest of your week!  Hennie Duos. Marletta Lor, D.O. Gastroenterology and Hepatology [facility]

## 2020-10-30 LAB — SURGICAL PATHOLOGY

## 2020-10-31 ENCOUNTER — Encounter (HOSPITAL_COMMUNITY): Payer: Self-pay | Admitting: Internal Medicine

## 2020-11-07 ENCOUNTER — Other Ambulatory Visit (HOSPITAL_COMMUNITY): Payer: Self-pay | Admitting: Nurse Practitioner

## 2020-11-07 DIAGNOSIS — Z1231 Encounter for screening mammogram for malignant neoplasm of breast: Secondary | ICD-10-CM

## 2020-11-10 ENCOUNTER — Other Ambulatory Visit: Payer: Self-pay

## 2020-11-10 ENCOUNTER — Ambulatory Visit (HOSPITAL_COMMUNITY)
Admission: RE | Admit: 2020-11-10 | Discharge: 2020-11-10 | Disposition: A | Discharge status: Home / Self Care | Payer: No Typology Code available for payment source | Source: Ambulatory Visit | Attending: Family Medicine | Admitting: Family Medicine

## 2020-11-10 DIAGNOSIS — Z1231 Encounter for screening mammogram for malignant neoplasm of breast: Secondary | ICD-10-CM | POA: Diagnosis not present

## 2020-11-12 ENCOUNTER — Ambulatory Visit: Payer: PRIVATE HEALTH INSURANCE | Admitting: Gastroenterology

## 2020-11-20 ENCOUNTER — Encounter (HOSPITAL_COMMUNITY): Payer: Self-pay | Admitting: Psychiatry

## 2020-11-20 ENCOUNTER — Telehealth (INDEPENDENT_AMBULATORY_CARE_PROVIDER_SITE_OTHER): Payer: No Typology Code available for payment source | Admitting: Psychiatry

## 2020-11-20 ENCOUNTER — Other Ambulatory Visit: Payer: Self-pay

## 2020-11-20 DIAGNOSIS — F3162 Bipolar disorder, current episode mixed, moderate: Secondary | ICD-10-CM

## 2020-11-20 MED ORDER — ALPRAZOLAM 1 MG PO TABS: 1.0000 mg | ORAL_TABLET | Freq: Three times a day (TID) | ORAL | 2 refills | Status: DC | PRN

## 2020-11-20 MED ORDER — BELSOMRA 20 MG PO TABS: 20.0000 mg | ORAL_TABLET | Freq: Every day | ORAL | 2 refills | Status: DC

## 2020-11-20 MED ORDER — QUETIAPINE FUMARATE 200 MG PO TABS: 200.0000 mg | ORAL_TABLET | Freq: Every day | ORAL | 2 refills | Status: DC

## 2020-11-20 MED ORDER — DIVALPROEX SODIUM ER 500 MG PO TB24: 1000.0000 mg | ORAL_TABLET | Freq: Every day | ORAL | 2 refills | Status: DC

## 2020-11-20 NOTE — Progress Notes (Signed)
Virtual Visit via Telephone Note  I connected with Anne Logan on 11/20/20 at 10:00 AM EDT by telephone and verified that I am speaking with the correct person using two identifiers.  Location: Patient: home Provider: office   I discussed the limitations, risks, security and privacy concerns of performing an evaluation and management service by telephone and the availability of in person appointments. I also discussed with the patient that there may be a patient responsible charge related to this service. The patient expressed understanding and agreed to proceed.      I discussed the assessment and treatment plan with the patient. The patient was provided an opportunity to ask questions and all were answered. The patient agreed with the plan and demonstrated an understanding of the instructions.   The patient was advised to call back or seek an in-person evaluation if the symptoms worsen or if the condition fails to improve as anticipated.  I provided 15 minutes of non-face-to-face time during this encounter.   [name], MD  [facility] MD/PA/NP OP Progress Note  11/20/2020 10:23 AM Anne Logan  MRN:  [id number]  Chief Complaint:  Chief Complaint   Depression; Anxiety; Follow-up    HPI: This patient is a 55 year old married white female lives with her husband in [place].  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  She has one grown son  The patient returns for follow-up after 3 months.  She states that nothing much is changed in her life.  Her husband is still dealing with chronic pancreatitis and is still at home unable to work.  She is working a lot from home so she is able to be with him.  Overall however her mood has been stable.  Her energy has been fairly good.  She is still not sleeping more than 4 to 5 hours a night.  We have tried trazodone Restoril and Ambien.  I suggested that we try Belsomra as it is when she has not yet tried.  I  also suggested she talk to her primary physician about a referral to sleep study. Visit Diagnosis:    ICD-10-CM   1. Bipolar 1 disorder, mixed, moderate (HCC)  F31.62       Past Psychiatric History: Hospitalization many years ago.  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  Past Medical History:  Past Medical History:  Diagnosis Date   Anxiety    Depression    GERD (gastroesophageal reflux disease)     Past Surgical History:  Procedure Laterality Date   BALLOON DILATION N/A 10/28/2020   Procedure: BALLOON DILATION;  Surgeon: Lanelle Bal, DO;  [facility];  Service: Endoscopy;  Laterality: N/A;   BIOPSY  01/23/2019   Procedure: BIOPSY;  Surgeon: West Bali, MD;  [facility];  Service: Endoscopy;;  duodenum gastric   BIOPSY  10/28/2020   Procedure: BIOPSY;  Surgeon: Lanelle Bal, DO;  [facility];  Service: Endoscopy;;   COLONOSCOPY WITH PROPOFOL N/A 01/23/2019   Dr. Darrick Penna: Significant looping of the colon, internal hemorrhoids, moderate diverticulosis with restricted mobility of the rectosigmoid colon.  Recommended 5-year follow-up colonoscopy due to prep, polyps less than 3 mm could have been missed.   ESOPHAGOGASTRODUODENOSCOPY (EGD) WITH PROPOFOL N/A 01/23/2019   Dr. Darrick Penna: Benign-appearing esophageal stricture status post dilation.  Gastritis, no H. pylori.  Small bowel biopsies benign, no celiac.   ESOPHAGOGASTRODUODENOSCOPY (EGD) WITH PROPOFOL N/A 10/28/2020  Procedure: ESOPHAGOGASTRODUODENOSCOPY (EGD) WITH PROPOFOL;  Surgeon: Lanelle Bal, DO;  [facility];  Service: Endoscopy;  Laterality: N/A;  7:30am   SAVORY DILATION N/A 01/23/2019   Procedure: SAVORY DILATION;  Surgeon: West Bali, MD;  [facility];  Service: Endoscopy;  Laterality: N/A;   TUBAL LIGATION Bilateral 2005    Family Psychiatric History: see below  Family History:  Family History  Problem Relation Age of Onset    Depression Mother    Cancer Mother        lymphoma   Alcohol abuse Father    Drug abuse Son    Colon cancer Neg Hx     Social History:  Social History   Socioeconomic History   Marital status: Married    Spouse name: Not on file   Number of children: Not on file   Years of education: Not on file   Highest education level: Not on file  Occupational History   Not on file  Tobacco Use   Smoking status: Every Day    Packs/day: 0.50    Years: 20.00    Pack years: 10.00    Types: Cigarettes   Smokeless tobacco: Never   Tobacco comments:    smokes 10 cig daily  Vaping Use   Vaping Use: Never used  Substance and Sexual Activity   Alcohol use: Not Currently    Alcohol/week: 0.0 standard drinks    Comment: quit 2 years ago   Drug use: No   Sexual activity: Not Currently    Partners: Male    Birth control/protection: Surgical    Comment: tubal  Other Topics Concern   Not on file  Social History Narrative   Not on file   Social Determinants of Health   Financial Resource Strain: Not on file  Food Insecurity: Not on file  Transportation Needs: Not on file  Physical Activity: Not on file  Stress: Not on file  Social Connections: Not on file    Allergies:  Allergies  Allergen Reactions   Other Nausea Only    Per patient Pain medications, any kind   Protonix [Pantoprazole Sodium]     Patient reported sore tongue after 2 days of pantoprazole.  No swelling reported.    Metabolic Disorder Labs: Lab Results  Component Value Date   HGBA1C 5.9 (H) 11/08/2019   No results found for: PROLACTIN Lab Results  Component Value Date   CHOL 184 02/07/2019   TRIG 170 (H) 02/07/2019   HDL 49 02/07/2019   CHOLHDL 3.8 02/07/2019   VLDL 37 (H) 05/01/2015   LDLCALC 105 (H) 02/07/2019   LDLCALC 129 (H) 11/22/2018   Lab Results  Component Value Date   TSH 0.58 06/13/2020   TSH 0.562 11/22/2018    Therapeutic Level Labs: No results found for: LITHIUM Lab Results   Component Value Date   VALPROATE 69.0 01/31/2020   VALPROATE 88 02/08/2018   No components found for:  CBMZ  Current Medications: Current Outpatient Medications  Medication Sig Dispense Refill   Suvorexant (BELSOMRA) 20 MG TABS Take 20 mg by mouth at bedtime. 30 tablet 2   ALPRAZolam (XANAX) 1 MG tablet Take 1 tablet (1 mg total) by mouth 3 (three) times daily as needed for anxiety. 90 tablet 2   aspirin-acetaminophen-caffeine (EXCEDRIN MIGRAINE) 250-250-65 MG tablet Take 2 tablets by mouth every 8 (eight) hours as needed for headache.      bisacodyl (DULCOLAX) 5 MG EC tablet Take 5 mg by  mouth 3 (three) times a week.     divalproex (DEPAKOTE ER) 500 MG 24 hr tablet Take 2 tablets (1,000 mg total) by mouth at bedtime. 60 tablet 2   QUEtiapine (SEROQUEL) 200 MG tablet Take 1 tablet (200 mg total) by mouth at bedtime. 90 tablet 2   RABEprazole (ACIPHEX) 20 MG tablet TAKE (1) TABLET BY MOUTH TWICE DAILY BEFORE BREAKFAST. 60 tablet 3   rosuvastatin (CRESTOR) 5 MG tablet TAKE (1) TABLET BY MOUTH ONCE DAILY FOR CHOLESTEROL. 30 tablet 0   No current facility-administered medications for this visit.     Musculoskeletal: Strength & Muscle Tone: na Gait & Station: na Patient leans: N/A  Psychiatric Specialty Exam: Review of Systems  Psychiatric/Behavioral:  Positive for sleep disturbance.   All other systems reviewed and are negative.  Last menstrual period 04/13/2015.There is no height or weight on file to calculate BMI.  General Appearance: NA  Eye Contact:  NA  Speech:  Clear and Coherent  Volume:  Normal  Mood:  Euthymic  Affect:  NA  Thought Process:  Goal Directed  Orientation:  Full (Time, Place, and Person)  Thought Content: WDL   Suicidal Thoughts:  No  Homicidal Thoughts:  No  Memory:  Immediate;   Good Recent;   Good Remote;   Good  Judgement:  Good  Insight:  Good  Psychomotor Activity:  Normal  Concentration:  Concentration: Good and Attention Span: Good   Recall:  Good  Fund of Knowledge: Good  Language: Good  Akathisia:  No  Handed:  Right  AIMS (if indicated): not done  Assets:  Communication Skills Desire for Improvement Physical Health Resilience Social Support Talents/Skills  ADL's:  Intact  Cognition: WNL  Sleep:  Poor   Screenings: PHQ2-9    Flowsheet Row [facility] Video Visit from 08/21/2020 in [facility] Video Visit from 04/22/2020 in [facility] Office Visit from 01/31/2017 in [facility]  PHQ-2 Total Score 0 0 0 0      Flowsheet Row Video Visit from 11/20/2020 [facility] Admission (Discharged) from 10/28/2020 in [facility] Video Visit from 08/21/2020 in [facility]  C-SSRS RISK CATEGORY No Risk No Risk No Risk        Assessment and Plan: This patient is a 55 year old female with a history of bipolar disorder anxiety and insomnia.  She is doing okay but is still not sleeping enough.  We will add Belsomra 20 mg at bedtime to her regimen for sleep.  She will continue Seroquel 200 mg at bedtime for mood stabilization, Xanax 1 mg 3 times daily as needed for anxiety and Depakote ER 1000 mg at bedtime for mood stabilization.  She will return to see me in 3 months   [name], MD 11/20/2020, 10:23 AM

## 2021-01-19 ENCOUNTER — Other Ambulatory Visit: Payer: Self-pay

## 2021-01-19 ENCOUNTER — Ambulatory Visit
Admission: EM | Admit: 2021-01-19 | Discharge: 2021-01-19 | Disposition: A | Discharge status: Home / Self Care | Payer: PRIVATE HEALTH INSURANCE | Attending: Urgent Care | Admitting: Urgent Care

## 2021-01-19 DIAGNOSIS — R070 Pain in throat: Secondary | ICD-10-CM

## 2021-01-19 DIAGNOSIS — B9789 Other viral agents as the cause of diseases classified elsewhere: Secondary | ICD-10-CM | POA: Diagnosis not present

## 2021-01-19 DIAGNOSIS — R051 Acute cough: Secondary | ICD-10-CM

## 2021-01-19 DIAGNOSIS — J988 Other specified respiratory disorders: Secondary | ICD-10-CM

## 2021-01-19 DIAGNOSIS — R0981 Nasal congestion: Secondary | ICD-10-CM | POA: Diagnosis not present

## 2021-01-19 LAB — POCT RAPID STREP A (OFFICE): Rapid Strep A Screen: NEGATIVE

## 2021-01-19 MED ORDER — PSEUDOEPHEDRINE HCL 30 MG PO TABS: 30.0000 mg | ORAL_TABLET | Freq: Three times a day (TID) | ORAL | 0 refills | Status: DC | PRN

## 2021-01-19 MED ORDER — CETIRIZINE HCL 10 MG PO TABS: 10.0000 mg | ORAL_TABLET | Freq: Every day | ORAL | 0 refills | Status: DC

## 2021-01-19 MED ORDER — BENZONATATE 100 MG PO CAPS: 100.0000 mg | ORAL_CAPSULE | Freq: Three times a day (TID) | ORAL | 0 refills | Status: DC | PRN

## 2021-01-19 MED ORDER — PROMETHAZINE-DM 6.25-15 MG/5ML PO SYRP: 5.0000 mL | ORAL_SOLUTION | Freq: Every evening | ORAL | 0 refills | Status: DC | PRN

## 2021-01-19 NOTE — ED Triage Notes (Signed)
Pt reports sore throat, cough, nasal congestion x 3 days.

## 2021-01-19 NOTE — Discharge Instructions (Signed)
We will notify you of your test results as they arrive and may take between 48-72 hours.  I encourage you to sign up for MyChart if you have not already done so as this can be the easiest way for us to communicate results to you online or through a phone app.  Generally, we only contact you if it is a positive test result.  In the meantime, if you develop worsening symptoms including fever, chest pain, shortness of breath despite our current treatment plan then please report to the emergency room as this may be a sign of worsening status from possible viral infection.  Otherwise, we will manage this as a viral syndrome. For sore throat or cough try using a honey-based tea. Use 3 teaspoons of honey with juice squeezed from half lemon. Place shaved pieces of ginger into 1/2-1 cup of water and warm over stove top. Then mix the ingredients and repeat every 4 hours as needed. Please take Tylenol 500mg-650mg every 6 hours for aches and pains, fevers. Hydrate very well with at least 2 liters of water. Eat light meals such as soups to replenish electrolytes and soft fruits, veggies. Start an antihistamine like Zyrtec for postnasal drainage, sinus congestion.  You can take this together with pseudoephedrine (Sudafed) at a dose of 30 mg 2-3 times a day as needed for the same kind of congestion.    

## 2021-01-19 NOTE — ED Provider Notes (Signed)
[facility]   MRN: [id number] DOB: 09/02/1965  Subjective:   Anne Logan is a 56 y.o. female presenting for 3-day history of acute onset persistent and worsening sinus congestion, postnasal drainage and throat pain, persistent hacking cough.  No sick contacts to her knowledge.  Smokes 1/3ppd. No history of COPD, asthma.  No chest pain, shortness of breath or wheezing.  No current facility-administered medications for this encounter.  Current Outpatient Medications:    ALPRAZolam (XANAX) 1 MG tablet, Take 1 tablet (1 mg total) by mouth 3 (three) times daily as needed for anxiety., Disp: 90 tablet, Rfl: 2   aspirin-acetaminophen-caffeine (EXCEDRIN MIGRAINE) 250-250-65 MG tablet, Take 2 tablets by mouth every 8 (eight) hours as needed for headache. , Disp: , Rfl:    bisacodyl (DULCOLAX) 5 MG EC tablet, Take 5 mg by mouth 3 (three) times a week., Disp: , Rfl:    divalproex (DEPAKOTE ER) 500 MG 24 hr tablet, Take 2 tablets (1,000 mg total) by mouth at bedtime., Disp: 60 tablet, Rfl: 2   QUEtiapine (SEROQUEL) 200 MG tablet, Take 1 tablet (200 mg total) by mouth at bedtime., Disp: 90 tablet, Rfl: 2   RABEprazole (ACIPHEX) 20 MG tablet, TAKE (1) TABLET BY MOUTH TWICE DAILY BEFORE BREAKFAST., Disp: 60 tablet, Rfl: 3   rosuvastatin (CRESTOR) 5 MG tablet, TAKE (1) TABLET BY MOUTH ONCE DAILY FOR CHOLESTEROL., Disp: 30 tablet, Rfl: 0   Suvorexant (BELSOMRA) 20 MG TABS, Take 20 mg by mouth at bedtime., Disp: 30 tablet, Rfl: 2   Allergies  Allergen Reactions   Other Nausea Only    Per patient Pain medications, any kind   Protonix [Pantoprazole Sodium]     Patient reported sore tongue after 2 days of pantoprazole.  No swelling reported.    Past Medical History:  Diagnosis Date   Anxiety    Depression    GERD (gastroesophageal reflux disease)      Past Surgical History:  Procedure Laterality Date   BALLOON DILATION N/A 10/28/2020   Procedure: BALLOON DILATION;  Surgeon:  Lanelle Balarver, Charles K, DO;  [facility];  Service: Endoscopy;  Laterality: N/A;   BIOPSY  01/23/2019   Procedure: BIOPSY;  Surgeon: West BaliFields, Sandi L, MD;  [facility];  Service: Endoscopy;;  duodenum gastric   BIOPSY  10/28/2020   Procedure: BIOPSY;  Surgeon: Lanelle Balarver, Charles K, DO;  [facility];  Service: Endoscopy;;   COLONOSCOPY WITH PROPOFOL N/A 01/23/2019   Dr. Darrick PennaFields: Significant looping of the colon, internal hemorrhoids, moderate diverticulosis with restricted mobility of the rectosigmoid colon.  Recommended 5-year follow-up colonoscopy due to prep, polyps less than 3 mm could have been missed.   ESOPHAGOGASTRODUODENOSCOPY (EGD) WITH PROPOFOL N/A 01/23/2019   Dr. Darrick PennaFields: Benign-appearing esophageal stricture status post dilation.  Gastritis, no H. pylori.  Small bowel biopsies benign, no celiac.   ESOPHAGOGASTRODUODENOSCOPY (EGD) WITH PROPOFOL N/A 10/28/2020   Procedure: ESOPHAGOGASTRODUODENOSCOPY (EGD) WITH PROPOFOL;  Surgeon: Lanelle Balarver, Charles K, DO;  [facility];  Service: Endoscopy;  Laterality: N/A;  7:30am   SAVORY DILATION N/A 01/23/2019   Procedure: SAVORY DILATION;  Surgeon: West BaliFields, Sandi L, MD;  [facility];  Service: Endoscopy;  Laterality: N/A;   TUBAL LIGATION Bilateral 2005    Family History  Problem Relation Age of Onset   Depression Mother    Cancer Mother        lymphoma   Alcohol abuse Father    Drug abuse Son  Colon cancer Neg Hx     Social History   Tobacco Use   Smoking status: Every Day    Packs/day: 0.50    Years: 20.00    Pack years: 10.00    Types: Cigarettes   Smokeless tobacco: Never   Tobacco comments:    smokes 10 cig daily  Vaping Use   Vaping Use: Never used  Substance Use Topics   Alcohol use: Not Currently    Alcohol/week: 0.0 standard drinks    Comment: quit 2 years ago   Drug use: No    ROS   Objective:   Vitals: BP 129/81 (BP Location: Right Arm)    Pulse (!) 108    Temp 98  F (36.7 C) (Oral)    Resp (!) 2    LMP 04/13/2015    SpO2 94%   Physical Exam Constitutional:      General: She is not in acute distress.    Appearance: Normal appearance. She is well-developed. She is not ill-appearing, toxic-appearing or diaphoretic.  HENT:     Head: Normocephalic and atraumatic.     Right Ear: Tympanic membrane and ear canal normal. No drainage or tenderness. No middle ear effusion. Tympanic membrane is not erythematous.     Left Ear: Tympanic membrane and ear canal normal. No drainage or tenderness.  No middle ear effusion. Tympanic membrane is not erythematous.     Nose: Nose normal. No congestion or rhinorrhea.     Mouth/Throat:     Mouth: Mucous membranes are moist. No oral lesions.     Pharynx: Oropharynx is clear. No pharyngeal swelling, oropharyngeal exudate, posterior oropharyngeal erythema or uvula swelling.     Tonsils: No tonsillar exudate or tonsillar abscesses.  Eyes:     Extraocular Movements: Extraocular movements intact.     Right eye: Normal extraocular motion.     Left eye: Normal extraocular motion.     Conjunctiva/sclera: Conjunctivae normal.     Pupils: Pupils are equal, round, and reactive to light.  Cardiovascular:     Rate and Rhythm: Normal rate and regular rhythm.     Pulses: Normal pulses.     Heart sounds: Normal heart sounds. No murmur heard.   No friction rub. No gallop.  Pulmonary:     Effort: Pulmonary effort is normal. No respiratory distress.     Breath sounds: Normal breath sounds. No stridor. No wheezing, rhonchi or rales.  Musculoskeletal:     Cervical back: Normal range of motion and neck supple.  Lymphadenopathy:     Cervical: No cervical adenopathy.  Skin:    General: Skin is warm and dry.     Findings: No rash.  Neurological:     General: No focal deficit present.     Mental Status: She is alert and oriented to person, place, and time.  Psychiatric:        Mood and Affect: Mood normal.        Behavior: Behavior  normal.        Thought Content: Thought content normal.    Results for orders placed or performed during the hospital encounter of 01/19/21 (from the past 24 hour(s))  POCT rapid strep A     Status: None   Collection Time: 01/19/21  9:09 AM  Result Value Ref Range   Rapid Strep A Screen Negative Negative    Assessment and Plan :   PDMP not reviewed this encounter.  1. Viral respiratory illness   2. Acute cough  3. Sinus congestion   4. Throat pain    We will pursue an outpatient chest x-ray given her hypoxia which range between 90 to 94% and primarily hovered around 91%.  High suspicion for COVID-19 given her symptoms, vital signs, current incidence in the community.  Will manage for viral respiratory illness otherwise with supportive care. Counseled patient on potential for adverse effects with medications prescribed/recommended today, ER and return-to-clinic precautions discussed, patient verbalized understanding.    Wallis Bamberg, [place] 01/19/21 [phone]

## 2021-01-23 ENCOUNTER — Encounter: Payer: Self-pay | Admitting: Nurse Practitioner

## 2021-02-04 ENCOUNTER — Telehealth: Payer: Self-pay

## 2021-02-04 NOTE — Telephone Encounter (Signed)
PA for Rabeprazole Sod DR 20 mg tab was approved from 02/04/21 through 02/03/2022. Approval letter to be scanned into patient's chart.

## 2021-02-16 ENCOUNTER — Other Ambulatory Visit (HOSPITAL_COMMUNITY): Payer: Self-pay | Admitting: Psychiatry

## 2021-02-18 ENCOUNTER — Encounter (HOSPITAL_COMMUNITY): Payer: Self-pay

## 2021-02-18 ENCOUNTER — Other Ambulatory Visit: Payer: Self-pay

## 2021-02-18 ENCOUNTER — Telehealth (INDEPENDENT_AMBULATORY_CARE_PROVIDER_SITE_OTHER): Payer: PRIVATE HEALTH INSURANCE | Admitting: Psychiatry

## 2021-02-18 ENCOUNTER — Encounter (HOSPITAL_COMMUNITY): Payer: Self-pay | Admitting: Psychiatry

## 2021-02-18 DIAGNOSIS — F3162 Bipolar disorder, current episode mixed, moderate: Secondary | ICD-10-CM

## 2021-02-18 MED ORDER — QUETIAPINE FUMARATE 200 MG PO TABS: 200.0000 mg | ORAL_TABLET | Freq: Every day | ORAL | 2 refills | Status: DC

## 2021-02-18 MED ORDER — DIVALPROEX SODIUM ER 500 MG PO TB24: 1000.0000 mg | ORAL_TABLET | Freq: Every day | ORAL | 2 refills | Status: DC

## 2021-02-18 MED ORDER — ALPRAZOLAM 1 MG PO TABS: 1.0000 mg | ORAL_TABLET | Freq: Three times a day (TID) | ORAL | 2 refills | Status: DC | PRN

## 2021-02-18 NOTE — Progress Notes (Signed)
Virtual Visit via Telephone Note  I connected with Anne Logan on 02/18/21 at  1:00 PM EST by telephone and verified that I am speaking with the correct person using two identifiers.  Location: Patient: home Provider: office   I discussed the limitations, risks, security and privacy concerns of performing an evaluation and management service by telephone and the availability of in person appointments. I also discussed with the patient that there may be a patient responsible charge related to this service. The patient expressed understanding and agreed to proceed.     I discussed the assessment and treatment plan with the patient. The patient was provided an opportunity to ask questions and all were answered. The patient agreed with the plan and demonstrated an understanding of the instructions.   The patient was advised to call back or seek an in-person evaluation if the symptoms worsen or if the condition fails to improve as anticipated.  I provided 15 minutes of non-face-to-face time during this encounter.   [name], MD  [facility] MD/PA/NP OP Progress Note  02/18/2021 1:24 PM Anne Logan  MRN:  [id number]  Chief Complaint:  Chief Complaint   Depression; Follow-up    HPI: This patient is a 56 year old married white female lives with her husband in [place].  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  She has one grown son  The patient returns for follow-up after 3 months.  By her report her husband's health is getting worse.  He has end-stage renal disease.  He developed sepsis in December and had to be hospitalized.  He also took a bad fall and he is in a lot of pain.  Recently he has been refusing to go to dialysis.  She is basically at her wits end.  She is talking to a Child psychotherapist from dialysis and is going to try to get a home health aide then.  It sounds as if he is going to have to do home dialysis.  It also sounds as if maybe they are  at the point of needing palliative care.  I mentioned all of this to her today.  Despite all this she tries to keep going.  She tried Belsomra for sleep but it really did not help.  She takes Xanax at night which helps to some degree.  She is somewhat depressed but is trying not to let it get the best of her.  She is staying busy and active.  She denies significant anxiety. Visit Diagnosis:    ICD-10-CM   1. Bipolar 1 disorder, mixed, moderate (HCC)  F31.62       Past Psychiatric History: Hospitalization many years ago.  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  Past Medical History:  Past Medical History:  Diagnosis Date   Anxiety    Depression    GERD (gastroesophageal reflux disease)     Past Surgical History:  Procedure Laterality Date   BALLOON DILATION N/A 10/28/2020   Procedure: BALLOON DILATION;  Surgeon: Lanelle Bal, DO;  [facility];  Service: Endoscopy;  Laterality: N/A;   BIOPSY  01/23/2019   Procedure: BIOPSY;  Surgeon: West Bali, MD;  [facility];  Service: Endoscopy;;  duodenum gastric   BIOPSY  10/28/2020   Procedure: BIOPSY;  Surgeon: Lanelle Bal, DO;  [facility];  Service: Endoscopy;;   COLONOSCOPY WITH PROPOFOL N/A 01/23/2019   Dr. Darrick Penna: Significant looping of the colon,  internal hemorrhoids, moderate diverticulosis with restricted mobility of the rectosigmoid colon.  Recommended 5-year follow-up colonoscopy due to prep, polyps less than 3 mm could have been missed.   ESOPHAGOGASTRODUODENOSCOPY (EGD) WITH PROPOFOL N/A 01/23/2019   Dr. Darrick PennaFields: Benign-appearing esophageal stricture status post dilation.  Gastritis, no H. pylori.  Small bowel biopsies benign, no celiac.   ESOPHAGOGASTRODUODENOSCOPY (EGD) WITH PROPOFOL N/A 10/28/2020   Procedure: ESOPHAGOGASTRODUODENOSCOPY (EGD) WITH PROPOFOL;  Surgeon: Lanelle Balarver, Charles K, DO;  [facility];  Service: Endoscopy;  Laterality: N/A;  7:30am   SAVORY  DILATION N/A 01/23/2019   Procedure: SAVORY DILATION;  Surgeon: West BaliFields, Sandi L, MD;  [facility];  Service: Endoscopy;  Laterality: N/A;   TUBAL LIGATION Bilateral 2005    Family Psychiatric History: see below  Family History:  Family History  Problem Relation Age of Onset   Depression Mother    Cancer Mother        lymphoma   Alcohol abuse Father    Drug abuse Son    Colon cancer Neg Hx     Social History:  Social History   Socioeconomic History   Marital status: Married    Spouse name: Not on file   Number of children: Not on file   Years of education: Not on file   Highest education level: Not on file  Occupational History   Not on file  Tobacco Use   Smoking status: Every Day    Packs/day: 0.50    Years: 20.00    Pack years: 10.00    Types: Cigarettes   Smokeless tobacco: Never   Tobacco comments:    smokes 10 cig daily  Vaping Use   Vaping Use: Never used  Substance and Sexual Activity   Alcohol use: Not Currently    Alcohol/week: 0.0 standard drinks    Comment: quit 2 years ago   Drug use: No   Sexual activity: Not Currently    Partners: Male    Birth control/protection: Surgical    Comment: tubal  Other Topics Concern   Not on file  Social History Narrative   Not on file   Social Determinants of Health   Financial Resource Strain: Not on file  Food Insecurity: Not on file  Transportation Needs: Not on file  Physical Activity: Not on file  Stress: Not on file  Social Connections: Not on file    Allergies:  Allergies  Allergen Reactions   Other Nausea Only    Per patient Pain medications, any kind   Protonix [Pantoprazole Sodium]     Patient reported sore tongue after 2 days of pantoprazole.  No swelling reported.    Metabolic Disorder Labs: Lab Results  Component Value Date   HGBA1C 5.9 (H) 11/08/2019   No results found for: PROLACTIN Lab Results  Component Value Date   CHOL 184 02/07/2019   TRIG 170 (H) 02/07/2019    HDL 49 02/07/2019   CHOLHDL 3.8 02/07/2019   VLDL 37 (H) 05/01/2015   LDLCALC 105 (H) 02/07/2019   LDLCALC 129 (H) 11/22/2018   Lab Results  Component Value Date   TSH 0.58 06/13/2020   TSH 0.562 11/22/2018    Therapeutic Level Labs: No results found for: LITHIUM Lab Results  Component Value Date   VALPROATE 69.0 01/31/2020   VALPROATE 88 02/08/2018   No components found for:  CBMZ  Current Medications: Current Outpatient Medications  Medication Sig Dispense Refill   ALPRAZolam (XANAX) 1 MG tablet Take 1 tablet (1  mg total) by mouth 3 (three) times daily as needed for anxiety. 90 tablet 2   aspirin-acetaminophen-caffeine (EXCEDRIN MIGRAINE) 250-250-65 MG tablet Take 2 tablets by mouth every 8 (eight) hours as needed for headache.      benzonatate (TESSALON) 100 MG capsule Take 1-2 capsules (100-200 mg total) by mouth 3 (three) times daily as needed for cough. 60 capsule 0   bisacodyl (DULCOLAX) 5 MG EC tablet Take 5 mg by mouth 3 (three) times a week.     cetirizine (ZYRTEC ALLERGY) 10 MG tablet Take 1 tablet (10 mg total) by mouth daily. 30 tablet 0   divalproex (DEPAKOTE ER) 500 MG 24 hr tablet Take 2 tablets (1,000 mg total) by mouth daily. 60 tablet 2   promethazine-dextromethorphan (PROMETHAZINE-DM) 6.25-15 MG/5ML syrup Take 5 mLs by mouth at bedtime as needed for cough. 100 mL 0   pseudoephedrine (SUDAFED) 30 MG tablet Take 1 tablet (30 mg total) by mouth every 8 (eight) hours as needed for congestion. 30 tablet 0   QUEtiapine (SEROQUEL) 200 MG tablet Take 1 tablet (200 mg total) by mouth at bedtime. 90 tablet 2   RABEprazole (ACIPHEX) 20 MG tablet TAKE (1) TABLET BY MOUTH TWICE DAILY BEFORE BREAKFAST. 60 tablet 3   rosuvastatin (CRESTOR) 5 MG tablet TAKE (1) TABLET BY MOUTH ONCE DAILY FOR CHOLESTEROL. 30 tablet 0   No current facility-administered medications for this visit.     Musculoskeletal: Strength & Muscle Tone: na Gait & Station: na Patient leans:  N/A  Psychiatric Specialty Exam: Review of Systems  Psychiatric/Behavioral:  The patient is nervous/anxious.   All other systems reviewed and are negative.  Last menstrual period 04/13/2015.There is no height or weight on file to calculate BMI.  General Appearance: NA  Eye Contact:  NA  Speech:  Clear and Coherent  Volume:  Normal  Mood:  Anxious and Euthymic  Affect:  NA  Thought Process:  Goal Directed  Orientation:  Full (Time, Place, and Person)  Thought Content: Rumination   Suicidal Thoughts:  No  Homicidal Thoughts:  No  Memory:  Immediate;   Good Recent;   Good Remote;   Good  Judgement:  Good  Insight:  Fair  Psychomotor Activity:  Normal  Concentration:  Concentration: Good and Attention Span: Good  Recall:  Good  Fund of Knowledge: Good  Language: Good  Akathisia:  No  Handed:  Right  AIMS (if indicated): not done  Assets:  Communication Skills Desire for Improvement Physical Health Resilience Social Support Talents/Skills Vocational/Educational  ADL's:  Intact  Cognition: WNL  Sleep:  Fair   Screenings: PHQ2-9    Flowsheet Row [facility] Video Visit from 11/20/2020 in [facility] Video Visit from 08/21/2020 in [facility] Video Visit from 04/22/2020 in [facility] Office Visit from 01/31/2017 in [facility]  PHQ-2 Total Score 1 0 0 0 0      Flowsheet Row Video [facility]2021 [facility] [place] Video Visit from 11/20/2020 [facility]  C-SSRS RISK CATEGORY No Risk No Risk No Risk        Assessment and Plan: This patient is a 56 year old female with a history of bipolar disorder anxiety insomnia.  None of  the sleeping aids have really helped.  She would like to stay on her current  regimen as she is dealing with a lot of things with her husband.  She will continue Seroquel 200 mg at bedtime for mood stabilization, Xanax 1 mg 3 times daily as needed for anxiety and Depakote ER 1000 mg at bedtime for mood stabilization.  She will return to see me in 12-month   [name], MD 02/18/2021, 1:24 PM

## 2021-03-23 ENCOUNTER — Other Ambulatory Visit: Payer: Self-pay | Admitting: Gastroenterology

## 2021-03-23 DIAGNOSIS — K219 Gastro-esophageal reflux disease without esophagitis: Secondary | ICD-10-CM

## 2021-03-23 NOTE — Telephone Encounter (Signed)
Noted  

## 2021-05-04 ENCOUNTER — Ambulatory Visit: Payer: Self-pay | Admitting: Gastroenterology

## 2021-05-18 ENCOUNTER — Encounter (HOSPITAL_COMMUNITY): Payer: Self-pay | Admitting: Psychiatry

## 2021-05-18 ENCOUNTER — Telehealth (INDEPENDENT_AMBULATORY_CARE_PROVIDER_SITE_OTHER): Payer: PRIVATE HEALTH INSURANCE | Admitting: Psychiatry

## 2021-05-18 DIAGNOSIS — F3175 Bipolar disorder, in partial remission, most recent episode depressed: Secondary | ICD-10-CM

## 2021-05-18 MED ORDER — DIVALPROEX SODIUM ER 500 MG PO TB24: 1000.0000 mg | ORAL_TABLET | Freq: Every day | ORAL | 2 refills | Status: DC

## 2021-05-18 MED ORDER — ALPRAZOLAM 1 MG PO TABS: 1.0000 mg | ORAL_TABLET | Freq: Three times a day (TID) | ORAL | 2 refills | Status: DC | PRN

## 2021-05-18 MED ORDER — QUETIAPINE FUMARATE 300 MG PO TABS: 300.0000 mg | ORAL_TABLET | Freq: Every day | ORAL | 2 refills | Status: DC

## 2021-05-18 NOTE — Progress Notes (Signed)
Virtual Visit via Telephone Note ? ?I connected with Anne Logan on 05/18/21 at  9:40 AM EDT by telephone and verified that I am speaking with the correct person using two identifiers. ? ?Location: ?Patient: home ?Provider: office ?  ?I discussed the limitations, risks, security and privacy concerns of performing an evaluation and management service by telephone and the availability of in person appointments. I also discussed with the patient that there may be a patient responsible charge related to this service. The patient expressed understanding and agreed to proceed. ? ? ? ?  ?I discussed the assessment and treatment plan with the patient. The patient was provided an opportunity to ask questions and all were answered. The patient agreed with the plan and demonstrated an understanding of the instructions. ?  ?The patient was advised to call back or seek an in-person evaluation if the symptoms worsen or if the condition fails to improve as anticipated. ? ?I provided 15 minutes of non-face-to-face time during this encounter. ? ? ?Diannia Rudereborah Skylan Gift, MD ? ?[facility] MD/PA/NP OP Progress Note ? ?05/18/2021 10:14 AM ?Anne Logan  ?MRN:  [id number] ? ?Chief Complaint:  ?Chief Complaint  ?Patient presents with  ? Depression  ? Anxiety  ? Follow-up  ? ?HPI: This patient is a 56 year old married white female lives with her [place] in [place].  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  She has one grown son ? ?The patient returns for follow-up after 3 months.  By her report her husband's health continues to decline.  He was admitted for sepsis in December and again for most of February.  He is not always compliant with his dialysis because sometimes he "feels too sick."  She is his main caregiver and is exhausting her.  She is still trying to do her job as well.  He refuses to let a home health aide come into the house but I explained that she probably has to put her foot down about it.  It is  very hard for her to work and worry about if he is okay at home.  She is still not sleeping well and I offered to increase the Seroquel and she is agreeable.  Despite all the stress she is trying to stay in a positive mood and denies significant depression anxiety thoughts of self-harm or suicide. ?Visit Diagnosis:  ?  ICD-10-CM   ?1. Bipolar disorder, in partial remission, most recent episode depressed (HCC)  F31.75   ?  ? ? ?Past Psychiatric History: Hospitalization many years ago.  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 ? ?Past Medical History:  ?Past Medical History:  ?Diagnosis Date  ? Anxiety   ? Depression   ? GERD (gastroesophageal reflux disease)   ?  ?Past Surgical History:  ?Procedure Laterality Date  ? BALLOON DILATION N/A 10/28/2020  ? Procedure: BALLOON DILATION;  Surgeon: Lanelle Balarver, Charles K, DO;  [facility];  Service: Endoscopy;  Laterality: N/A;  ? BIOPSY  01/23/2019  ? Procedure: BIOPSY;  Surgeon: West BaliFields, Sandi L, MD;  [facility];  Service: Endoscopy;;  duodenum ?gastric  ? BIOPSY  10/28/2020  ? Procedure: BIOPSY;  Surgeon: Lanelle Balarver, Charles K, DO;  [facility];  Service: Endoscopy;;  ? COLONOSCOPY WITH PROPOFOL N/A 01/23/2019  ? Dr. Darrick PennaFields: Significant looping of the colon, internal hemorrhoids, moderate diverticulosis with restricted mobility of the rectosigmoid colon.  Recommended 5-year follow-up colonoscopy due to prep, polyps less than  3 mm could have been missed.  ? ESOPHAGOGASTRODUODENOSCOPY (EGD) WITH PROPOFOL N/A 01/23/2019  ? Dr. Darrick Penna: Benign-appearing esophageal stricture status post dilation.  Gastritis, no H. pylori.  Small bowel biopsies benign, no celiac.  ? ESOPHAGOGASTRODUODENOSCOPY (EGD) WITH PROPOFOL N/A 10/28/2020  ? Procedure: ESOPHAGOGASTRODUODENOSCOPY (EGD) WITH PROPOFOL;  Surgeon: Lanelle Bal, DO;  [facility];  Service: Endoscopy;  Laterality: N/A;  7:30am  ? SAVORY DILATION N/A 01/23/2019  ? Procedure: SAVORY DILATION;   Surgeon: West Bali, MD;  [facility];  Service: Endoscopy;  Laterality: N/A;  ? TUBAL LIGATION Bilateral 2005  ? ? ?Family Psychiatric History: see below ? ?Family History:  ?Family History  ?Problem Relation Age of Onset  ? Depression Mother   ? Cancer Mother   ?     lymphoma  ? Alcohol abuse Father   ? Drug abuse Son   ? Colon cancer Neg Hx   ? ? ?Social History:  ?Social History  ? ?Socioeconomic History  ? Marital status: Married  ?  Spouse name: Not on file  ? Number of children: Not on file  ? Years of education: Not on file  ? Highest education level: Not on file  ?Occupational History  ? Not on file  ?Tobacco Use  ? Smoking status: Every Day  ?  Packs/day: 0.50  ?  Years: 20.00  ?  Pack years: 10.00  ?  Types: Cigarettes  ? Smokeless tobacco: Never  ? Tobacco comments:  ?  smokes 10 cig daily  ?Vaping Use  ? Vaping Use: Never used  ?Substance and Sexual Activity  ? Alcohol use: Not Currently  ?  Alcohol/week: 0.0 standard drinks  ?  Comment: quit 2 years ago  ? Drug use: No  ? Sexual activity: Not Currently  ?  Partners: Male  ?  Birth control/protection: Surgical  ?  Comment: tubal  ?Other Topics Concern  ? Not on file  ?Social History Narrative  ? Not on file  ? ?Social Determinants of Health  ? ?Financial Resource Strain: Not on file  ?Food Insecurity: Not on file  ?Transportation Needs: Not on file  ?Physical Activity: Not on file  ?Stress: Not on file  ?Social Connections: Not on file  ? ? ?Allergies:  ?Allergies  ?Allergen Reactions  ? Other Nausea Only  ?  Per patient Pain medications, any kind  ? Protonix [Pantoprazole Sodium]   ?  Patient reported sore tongue after 2 days of pantoprazole.  No swelling reported.  ? ? ?Metabolic Disorder Labs: ?Lab Results  ?Component Value Date  ? HGBA1C 5.9 (H) 11/08/2019  ? ?No results found for: PROLACTIN ?Lab Results  ?Component Value Date  ? CHOL 184 02/07/2019  ? TRIG 170 (H) 02/07/2019  ? HDL 49 02/07/2019  ? CHOLHDL 3.8 02/07/2019  ? VLDL 37  (H) 05/01/2015  ? LDLCALC 105 (H) 02/07/2019  ? LDLCALC 129 (H) 11/22/2018  ? ?Lab Results  ?Component Value Date  ? TSH 0.58 06/13/2020  ? TSH 0.562 11/22/2018  ? ? ?Therapeutic Level Labs: ?No results found for: LITHIUM ?Lab Results  ?Component Value Date  ? VALPROATE 69.0 01/31/2020  ? VALPROATE 88 02/08/2018  ? ?No components found for:  CBMZ ? ?Current Medications: ?Current Outpatient Medications  ?Medication Sig Dispense Refill  ? QUEtiapine (SEROQUEL) 300 MG tablet Take 1 tablet (300 mg total) by mouth at bedtime. 30 tablet 2  ? ALPRAZolam (XANAX) 1 MG tablet Take 1 tablet (1 mg total)  by mouth 3 (three) times daily as needed for anxiety. 90 tablet 2  ? aspirin-acetaminophen-caffeine (EXCEDRIN MIGRAINE) 250-250-65 MG tablet Take 2 tablets by mouth every 8 (eight) hours as needed for headache.     ? benzonatate (TESSALON) 100 MG capsule Take 1-2 capsules (100-200 mg total) by mouth 3 (three) times daily as needed for cough. 60 capsule 0  ? bisacodyl (DULCOLAX) 5 MG EC tablet Take 5 mg by mouth 3 (three) times a week.    ? cetirizine (ZYRTEC ALLERGY) 10 MG tablet Take 1 tablet (10 mg total) by mouth daily. 30 tablet 0  ? divalproex (DEPAKOTE ER) 500 MG 24 hr tablet Take 2 tablets (1,000 mg total) by mouth daily. 60 tablet 2  ? promethazine-dextromethorphan (PROMETHAZINE-DM) 6.25-15 MG/5ML syrup Take 5 mLs by mouth at bedtime as needed for cough. 100 mL 0  ? pseudoephedrine (SUDAFED) 30 MG tablet Take 1 tablet (30 mg total) by mouth every 8 (eight) hours as needed for congestion. 30 tablet 0  ? QUEtiapine (SEROQUEL) 200 MG tablet Take 1 tablet (200 mg total) by mouth at bedtime. 90 tablet 2  ? RABEprazole (ACIPHEX) 20 MG tablet TAKE (1) TABLET BY MOUTH TWICE DAILY BEFORE BREAKFAST. 60 tablet 5  ? rosuvastatin (CRESTOR) 5 MG tablet TAKE (1) TABLET BY MOUTH ONCE DAILY FOR CHOLESTEROL. 30 tablet 0  ? ?No current facility-administered medications for this visit.  ? ? ? ?Musculoskeletal: ?Strength & Muscle Tone:  na ?Gait & Station: na ?Patient leans: N/A ? ?Psychiatric Specialty Exam: ?Review of Systems  ?Psychiatric/Behavioral:  Positive for sleep disturbance. The patient is nervous/anxious.   ?All other system

## 2021-06-16 ENCOUNTER — Other Ambulatory Visit (HOSPITAL_COMMUNITY): Payer: Self-pay | Admitting: Psychiatry

## 2021-06-16 ENCOUNTER — Telehealth (HOSPITAL_COMMUNITY): Payer: Self-pay | Admitting: *Deleted

## 2021-06-16 MED ORDER — QUETIAPINE FUMARATE 200 MG PO TABS: 200.0000 mg | ORAL_TABLET | Freq: Every day | ORAL | 2 refills | Status: DC

## 2021-06-16 NOTE — Telephone Encounter (Signed)
Tell her to go back to Seroquel 200 mg qhs. I have sent in refills

## 2021-06-16 NOTE — Telephone Encounter (Signed)
Informed patient and she agreed and verbalized understanding.

## 2021-06-16 NOTE — Telephone Encounter (Signed)
Patient called stating she would like to speak with provider.   Per pt the 300 mg is making her too sleepy. She feels like this 300 mg may be too strong for her she don't know. Per pt she need advise as to what to do.

## 2021-07-05 IMAGING — NM NM GASTRIC EMPTYING
8 series · 8 of 8 positions shown · non-contrast
Comparison: None

CLINICAL DATA: Abdominal pain, nausea, pain with eating, 9 months
history with worsening symptoms over time, bloating, early satiety,
reflux, occasional nausea and vomiting

EXAM:
NUCLEAR MEDICINE GASTRIC EMPTYING SCAN
TECHNIQUE: After oral ingestion of radiolabeled meal, sequential abdominal
images were obtained for 4 hours. Percentage of activity emptying
the stomach was calculated at 1 hour, 2 hour, 3 hour, and 4 hours.
RADIOPHARMACEUTICALS:  2 mCi 1c-33m sulfur colloid in standardized
meal

[Series 1: 0 min · 4.14mm/px · 1 of 1 slices shown (1 of 2)]
[im 1/1]
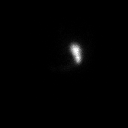

[Series 1: 0 min · 4.14mm/px · 1 of 1 slices shown (2 of 2)]
[im 1/1]
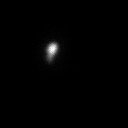

[Series 2: 60 min · 4.14mm/px · 1 of 1 slices shown (1 of 2)]
[im 1/1]
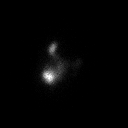

[Series 2: 60 min · 4.14mm/px · 1 of 1 slices shown (2 of 2)]
[im 1/1]
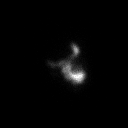

[Series 3: 120 min · 4.14mm/px · 1 of 1 slices shown (1 of 4)]
[im 1/1]
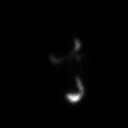

[Series 3: 120 min · 4.14mm/px · 1 of 1 slices shown (2 of 4)]
[im 1/1]
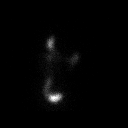

[Series 4: 120 min · 4.14mm/px · 1 of 1 slices shown (3 of 4)]
[im 1/1]
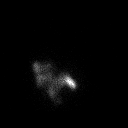

[Series 4: 120 min · 4.14mm/px · 1 of 1 slices shown (4 of 4)]
[im 1/1]
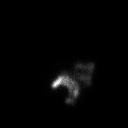

[8 of 8 positions shown; findings below may reference images not displayed]

FINDINGS: Expected location of the stomach in the left upper quadrant.

Ingested meal empties the stomach gradually over the course of the
study.

58% emptied at 1 hr ( normal >= 10%)

70% emptied at 2 hr ( normal >= 40%)

97% emptied at 3 hr ( normal >= 70%)

N/A emptied at 4 hr ( normal >= 90%) **[HOSPITAL] 4 hours not
required
IMPRESSION: Normal gastric emptying study.

## 2021-08-04 NOTE — Progress Notes (Unsigned)
Referring Provider: Babs Sciara, MD Primary Care Physician:  Babs Sciara, MD Primary GI Physician: Dr. Marletta Lor  Chief Complaint  Patient presents with   Follow-up    HPI:   Anne Logan is a 56 y.o. female with history of GERD, esophageal stricture s/p dilation in 2021, alternating constipation and diarrhea, presenting today for follow-up of alternating constipation and diarrhea, GERD, dysphagia s/p EGD.  Last seen in our office 06/11/2020.  GERD was improved with Aciphex 20 mg daily, but intermittent flares a couple times a week with associated epigastric pain and nausea without vomiting.  She was consuming significant amounts of Excedrin Migraine which was suspected to be contributing to her refractory symptoms.  Aciphex increase to twice daily.  She also reported large pill dysphagia with improvement with prior dilation.  Plan to pursue EGD once cleared by cardiology for reported chest pain.  Regarding alternating constipation and diarrhea, this was chronic and diarrhea was more frequent occurring 3 out of 7 days a week with associated abdominal cramping prior to bowel movements that improves thereafter.  Constipation with 2-3 times a month.  Suspected IBS mixed type.  Query whether significant Excedrin Migraine use may be contributing.  Plan to screen for celiac disease and check thyroid function.  I recommended discontinuing Excedrin Migraine and calling with a progress report in 4 weeks.  If no improvement, can try dicyclomine.  Celiac screen was negative.  Thyroid function low normal at 0.58.  No progress report received.   EGD 10/28/2020: Mild Schatzki's ring dilated, gastritis biopsied, normal examined duodenum.  Pathology with antral and oxyntic mucosa with slight chronic inflammation, negative for H. pylori.  Today:   GERD:  Taking Aciphex BID. Hasn't noticed much of a change in her symptoms. Continues with intermittent belching off and on a few days a week. Spicy  foods will make symptoms worse. Rare soda. No fried foods.  Intermittent upper abdominal pain, right in the middle at her bra line, after eating. Will last an hour or so. Occurs a few days a week. No specific food trigger. No nausea. Has been present for years without any significant change.   Dysphagia:  Improved, but seems to be slowly coming back. Only with pills. Doesn't want to anything right now unless symptoms worsen.   Alternating constipation/diarrhea:  Bowels have regulated fairly well. Only with occasional constipation or diarrhea.     Past Medical History:  Diagnosis Date   Anxiety    Depression    GERD (gastroesophageal reflux disease)     Past Surgical History:  Procedure Laterality Date   BALLOON DILATION N/A 10/28/2020   Procedure: BALLOON DILATION;  Surgeon: Lanelle Bal, DO;  [facility];  Service: Endoscopy;  Laterality: N/A;   BIOPSY  01/23/2019   Procedure: BIOPSY;  Surgeon: West Bali, MD;  [facility];  Service: Endoscopy;;  duodenum gastric   BIOPSY  10/28/2020   Procedure: BIOPSY;  Surgeon: Lanelle Bal, DO;  [facility];  Service: Endoscopy;;   COLONOSCOPY WITH PROPOFOL N/A 01/23/2019   Dr. Darrick Penna: Significant looping of the colon, internal hemorrhoids, moderate diverticulosis with restricted mobility of the rectosigmoid colon.  Recommended 5-year follow-up colonoscopy due to prep, polyps less than 3 mm could have been missed.   ESOPHAGOGASTRODUODENOSCOPY (EGD) WITH PROPOFOL N/A 01/23/2019   Dr. Darrick Penna: Benign-appearing esophageal stricture status post dilation.  Gastritis, no H. pylori.  Small bowel biopsies benign, no celiac.   ESOPHAGOGASTRODUODENOSCOPY (  EGD) WITH PROPOFOL N/A 10/28/2020   Surgeon: Lanelle Bal, DO;  mild Schatzki's ring s/p dilation, gastritis biopsied, normal examined duodenum.  Pathology with antral and oxyntic mucosa with slight chronic inflammation, negative for H. pylori.    SAVORY DILATION N/A 01/23/2019   Procedure: SAVORY DILATION;  Surgeon: West Bali, MD;  [facility];  Service: Endoscopy;  Laterality: N/A;   TUBAL LIGATION Bilateral 2005    Current Outpatient Medications  Medication Sig Dispense Refill   ALPRAZolam (XANAX) 1 MG tablet Take 1 tablet (1 mg total) by mouth 3 (three) times daily as needed for anxiety. 90 tablet 2   aspirin-acetaminophen-caffeine (EXCEDRIN MIGRAINE) 250-250-65 MG tablet Take 2 tablets by mouth every 8 (eight) hours as needed for headache.      dexlansoprazole (DEXILANT) 60 MG capsule Take 1 capsule (60 mg total) by mouth daily. 30 capsule 3   divalproex (DEPAKOTE ER) 500 MG 24 hr tablet Take 2 tablets (1,000 mg total) by mouth daily. 60 tablet 2   QUEtiapine (SEROQUEL) 200 MG tablet Take 1 tablet (200 mg total) by mouth at bedtime. 90 tablet 2   rosuvastatin (CRESTOR) 5 MG tablet TAKE (1) TABLET BY MOUTH ONCE DAILY FOR CHOLESTEROL. 30 tablet 0   No current facility-administered medications for this visit.    Allergies as of 08/05/2021 - Review Complete 08/05/2021  Allergen Reaction Noted   Other Nausea Only 01/02/2019   Protonix [pantoprazole sodium]  12/18/2018    Family History  Problem Relation Age of Onset   Depression Mother    Cancer Mother        lymphoma   Alcohol abuse Father    Drug abuse Son    Colon cancer Neg Hx     Social History   Socioeconomic History   Marital status: Married    Spouse name: Not on file   Number of children: Not on file   Years of education: Not on file   Highest education level: Not on file  Occupational History   Not on file  Tobacco Use   Smoking status: Every Day    Packs/day: 0.50    Years: 20.00    Total pack years: 10.00    Types: Cigarettes   Smokeless tobacco: Never   Tobacco comments:    smokes 10 cig daily  Vaping Use   Vaping Use: Never used  Substance and Sexual Activity   Alcohol use: Not Currently    Alcohol/week: 0.0 standard  drinks of alcohol    Comment: quit 2 years ago   Drug use: No   Sexual activity: Not Currently    Partners: Male    Birth control/protection: Surgical    Comment: tubal  Other Topics Concern   Not on file  Social History Narrative   Not on file   Social Determinants of Health   Financial Resource Strain: Not on file  Food Insecurity: Not on file  Transportation Needs: Not on file  Physical Activity: Not on file  Stress: Not on file  Social Connections: Not on file    Review of Systems: Gen: Denies fever, chills, cold or flu like symptoms, pre-syncope, or syncope.  CV: Denies chest pain, palpitations. Resp: Denies dyspnea at rest, cough. GI: See HPI Heme: See HPI  Physical Exam: BP (!) 142/80 (BP Location: Left Arm, Patient Position: Sitting, Cuff Size: Normal)   Pulse (!) 111   Temp 97.8 F (36.6 C) (Temporal)   Ht 5\' 2"  (1.575 m)   Wt  147 lb 12.8 oz (67 kg)   LMP 04/13/2015   BMI 27.03 kg/m  General:   Alert and oriented. No distress noted. Pleasant and cooperative.  Head:  Normocephalic and atraumatic. Eyes:  Conjuctiva clear without scleral icterus. Heart:  S1, S2 present without murmurs appreciated. Lungs:  Clear to auscultation bilaterally. No wheezes, rales, or rhonchi. No distress.  Abdomen:  +BS, soft, non-tender and non-distended. No rebound or guarding. No HSM or masses noted. Msk:  Symmetrical without gross deformities. Normal posture. Extremities:  Without edema. Neurologic:  Alert and  oriented x4 Psych:  Normal mood and affect.    Assessment:  56 y.o. female with history of GERD, epigastric pain, esophageal stricture s/p dilation in 2021, alternating constipation and diarrhea, presenting today for follow-up s/p EGD completed 10/28/2020 for dysphagia revealing mild Schatzki's ring s/p dilation, gastritis biopsied, normal examined duodenum.  Pathology with antral and oxyntic mucosa with slight chronic inflammation, negative for H.  pylori.  Dysphagia: Reports symptoms improved initially, but seem to be slowly returning.  Only with pills.  She is not interested in any further evaluation at this time unless symptoms worsen.  GERD: Typical heartburn symptoms well controlled on Aciphex twice daily.  However, she reports intermittent belching off-and-on a few times a week postprandially.  Symptoms are worse with spicy foods, but she avoids these and continues with intermittent symptoms.  Denies fried foods.  Rare soda. We will try her on Dexilant to see if this provides any significant improvement.  Epigastric pain: Reports few year history of intermittent postprandial pain in the middle upper abdomen at the xiphoid process without specific trigger and no significant change over the years.   Aciphex previously increased to twice daily in May 2022 with no significant change. Recent EGD October 2022 with mild Schatzki's ring dilated and gastritis biopsied revealing slight chronic inflammation, negative for H. pylori.  Last imaging on file from 2020:  Ultrasound without gallstones, CT with no significant abnormalities, gastric emptying study normal.  Etiology may be secondary to GERD and/or gastritis.  We will try changing PPI to see if this provides any additional improvement.  Otherwise, patient is not interested in any further evaluation at this time.  Alternating constipation and diarrhea: Chronic.  Bowel movements have normalized fairly well without any intervention.  Only with occasional constipation or diarrhea.  Recommended fiber supplement daily.   Plan:  Stop Aciphex and start Dexilant 60 mg daily. Reinforced GERD diet/lifestyle.  Separate written instructions provided. Patient will continue to monitor dysphagia symptoms and let us know of any worsening. Dysphagia precautions discussed.  Separate written instructions provided. Start Benefiber 2 teaspoons daily x2 weeks, then increase to twice daily. Follow-up in 4 months  or sooner if needed.   Ermalinda Memos, PA-C [facility] 08/05/2021

## 2021-08-05 ENCOUNTER — Encounter: Payer: Self-pay | Admitting: Gastroenterology

## 2021-08-05 ENCOUNTER — Ambulatory Visit (INDEPENDENT_AMBULATORY_CARE_PROVIDER_SITE_OTHER): Payer: PRIVATE HEALTH INSURANCE | Admitting: Gastroenterology

## 2021-08-05 VITALS — BP 142/80 | HR 111 | Temp 97.8°F | Ht 62.0 in | Wt 147.8 lb

## 2021-08-05 DIAGNOSIS — R1013 Epigastric pain: Secondary | ICD-10-CM | POA: Diagnosis not present

## 2021-08-05 DIAGNOSIS — K219 Gastro-esophageal reflux disease without esophagitis: Secondary | ICD-10-CM

## 2021-08-05 DIAGNOSIS — R198 Other specified symptoms and signs involving the digestive system and abdomen: Secondary | ICD-10-CM

## 2021-08-05 DIAGNOSIS — R1310 Dysphagia, unspecified: Secondary | ICD-10-CM

## 2021-08-05 MED ORDER — DEXLANSOPRAZOLE 60 MG PO CPDR
60.0000 mg | DELAYED_RELEASE_CAPSULE | Freq: Every day | ORAL | 3 refills | Status: DC
Start: 2021-08-05 — End: 2021-11-05

## 2021-08-05 NOTE — Patient Instructions (Signed)
Stop Aciphex and start Dexilant 60 mg daily.  We will see if this will help with her intermittent indigestion symptoms and upper abdominal discomfort. Let me know if you have any trouble obtaining Dexilant or if you have any worsening symptoms.  Follow a GERD diet:  Avoid fried, fatty, greasy, spicy, citrus foods. Avoid caffeine and carbonated beverages. Avoid chocolate. Try eating 4-6 small meals a day rather than 3 large meals. Do not eat within 3 hours of laying down. Prop head of bed up on wood or bricks to create a 6 inch incline.  To help with bowel regularity, you can start Benefiber 2 teaspoons daily x2 weeks, then increase to twice daily.  Continue to monitor your swallowing problems and let me know of any worsening symptoms.  Swallowing precautions: Eat slowly, take small bites, chew thoroughly, drink plenty of liquids throughout meals.  Avoid trough textures Chop meats finely.  If something gets hung in your esophagus and will not come up or go down, proceed to the emergency room.    We will see you back in 4 months or sooner if needed.   Ermalinda Memos, PA-C [facility]

## 2021-08-10 ENCOUNTER — Telehealth: Payer: Self-pay | Admitting: *Deleted

## 2021-08-10 NOTE — Telephone Encounter (Signed)
Received letter stated that pt was approved for Dexilant 60 mg. Was scanned into chart.

## 2021-08-11 ENCOUNTER — Telehealth: Payer: Self-pay

## 2021-08-11 NOTE — Telephone Encounter (Signed)
PA for Dexlansoprazole 60 mg DR capsules was approved.

## 2021-08-20 ENCOUNTER — Ambulatory Visit (INDEPENDENT_AMBULATORY_CARE_PROVIDER_SITE_OTHER): Payer: PRIVATE HEALTH INSURANCE | Admitting: Psychiatry

## 2021-08-20 ENCOUNTER — Encounter (HOSPITAL_COMMUNITY): Payer: Self-pay | Admitting: Psychiatry

## 2021-08-20 VITALS — BP 119/86 | HR 105 | Ht 62.0 in | Wt 144.3 lb

## 2021-08-20 DIAGNOSIS — F3175 Bipolar disorder, in partial remission, most recent episode depressed: Secondary | ICD-10-CM

## 2021-08-20 MED ORDER — ALPRAZOLAM 1 MG PO TABS: 1.0000 mg | ORAL_TABLET | Freq: Three times a day (TID) | ORAL | 2 refills | Status: DC | PRN

## 2021-08-20 MED ORDER — DIVALPROEX SODIUM ER 500 MG PO TB24: 1000.0000 mg | ORAL_TABLET | Freq: Every day | ORAL | 2 refills | Status: DC

## 2021-08-20 MED ORDER — QUETIAPINE FUMARATE 200 MG PO TABS: 200.0000 mg | ORAL_TABLET | Freq: Every day | ORAL | 2 refills | Status: DC

## 2021-08-20 NOTE — Progress Notes (Signed)
[facility] MD/PA/NP OP Progress Note  08/20/2021 4:59 PM Anne Logan  MRN:  [id number]  Chief Complaint:  Chief Complaint  Patient presents with   Depression   Anxiety   Follow-up   HPI: This patient is a 56 year old married white female lives with her husband in [place].  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  She has one grown son  The patient returns for follow-up after 3 months.  She still dealing with her husband's illnesses.  He is on dialysis has recurrent pancreatitis and liver cirrhosis and had sepsis twice in the last few months.  She is trying to do the best she can as she is still working full-time.  We tried increasing the Seroquel to help her sleep but it made her excessively drowsy the next day.  She is sleeping as well as she can given the husband's problems that come up at night.  She states her mood is stable and she denies significant depression anxiety thoughts of self-harm suicidal ideation or manic symptoms. Visit Diagnosis:    ICD-10-CM   1. Bipolar disorder, in partial remission, most recent episode depressed (HCC)  F31.75       Past Psychiatric History: Hospitalization many years ago.  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  Past Medical History:  Past Medical History:  Diagnosis Date   Anxiety    Depression    GERD (gastroesophageal reflux disease)     Past Surgical History:  Procedure Laterality Date   BALLOON DILATION N/A 10/28/2020   Procedure: BALLOON DILATION;  Surgeon: Lanelle Bal, DO;  [facility];  Service: Endoscopy;  Laterality: N/A;   BIOPSY  01/23/2019   Procedure: BIOPSY;  Surgeon: West Bali, MD;  [facility];  Service: Endoscopy;;  duodenum gastric   BIOPSY  10/28/2020   Procedure: BIOPSY;  Surgeon: Lanelle Bal, DO;  [facility];  Service: Endoscopy;;   COLONOSCOPY WITH PROPOFOL N/A 01/23/2019   Dr. Darrick Penna: Significant looping of the colon,  internal hemorrhoids, moderate diverticulosis with restricted mobility of the rectosigmoid colon.  Recommended 5-year follow-up colonoscopy due to prep, polyps less than 3 mm could have been missed.   ESOPHAGOGASTRODUODENOSCOPY (EGD) WITH PROPOFOL N/A 01/23/2019   Dr. Darrick Penna: Benign-appearing esophageal stricture status post dilation.  Gastritis, no H. pylori.  Small bowel biopsies benign, no celiac.   ESOPHAGOGASTRODUODENOSCOPY (EGD) WITH PROPOFOL N/A 10/28/2020   Surgeon: Lanelle Bal, DO;  mild Schatzki's ring s/p dilation, gastritis biopsied, normal examined duodenum.  Pathology with antral and oxyntic mucosa with slight chronic inflammation, negative for H. pylori.   SAVORY DILATION N/A 01/23/2019   Procedure: SAVORY DILATION;  Surgeon: West Bali, MD;  [facility];  Service: Endoscopy;  Laterality: N/A;   TUBAL LIGATION Bilateral 2005    Family Psychiatric History: See below  Family History:  Family History  Problem Relation Age of Onset   Depression Mother    Cancer Mother        lymphoma   Alcohol abuse Father    Drug abuse Son    Colon cancer Neg Hx     Social History:  Social History   Socioeconomic History   Marital status: Married    Spouse name: Not on file   Number of children: Not on file   Years of education: Not on file   Highest education level: Not on file  Occupational History   Not on  file  Tobacco Use   Smoking status: Every Day    Packs/day: 0.50    Years: 20.00    Total pack years: 10.00    Types: Cigarettes   Smokeless tobacco: Never   Tobacco comments:    smokes 10 cig daily  Vaping Use   Vaping Use: Never used  Substance and Sexual Activity   Alcohol use: Not Currently    Alcohol/week: 0.0 standard drinks of alcohol    Comment: quit 2 years ago   Drug use: No   Sexual activity: Not Currently    Partners: Male    Birth control/protection: Surgical    Comment: tubal  Other Topics Concern   Not on file  Social  History Narrative   Not on file   Social Determinants of Health   Financial Resource Strain: Not on file  Food Insecurity: Not on file  Transportation Needs: Not on file  Physical Activity: Not on file  Stress: Not on file  Social Connections: Not on file    Allergies:  Allergies  Allergen Reactions   Other Nausea Only    Per patient Pain medications, any kind   Protonix [Pantoprazole Sodium]     Patient reported sore tongue after 2 days of pantoprazole.  No swelling reported.    Metabolic Disorder Labs: Lab Results  Component Value Date   HGBA1C 5.9 (H) 11/08/2019   No results found for: "PROLACTIN" Lab Results  Component Value Date   CHOL 184 02/07/2019   TRIG 170 (H) 02/07/2019   HDL 49 02/07/2019   CHOLHDL 3.8 02/07/2019   VLDL 37 (H) 05/01/2015   LDLCALC 105 (H) 02/07/2019   LDLCALC 129 (H) 11/22/2018   Lab Results  Component Value Date   TSH 0.58 06/13/2020   TSH 0.562 11/22/2018    Therapeutic Level Labs: No results found for: "LITHIUM" Lab Results  Component Value Date   VALPROATE 69.0 01/31/2020   VALPROATE 88 02/08/2018   No results found for: "CBMZ"  Current Medications: Current Outpatient Medications  Medication Sig Dispense Refill   aspirin-acetaminophen-caffeine (EXCEDRIN MIGRAINE) 250-250-65 MG tablet Take 2 tablets by mouth every 8 (eight) hours as needed for headache.      dexlansoprazole (DEXILANT) 60 MG capsule Take 1 capsule (60 mg total) by mouth daily. 30 capsule 3   Omega-3 Fatty Acids (FISH OIL PO) Take by mouth.     ALPRAZolam (XANAX) 1 MG tablet Take 1 tablet (1 mg total) by mouth 3 (three) times daily as needed for anxiety. 90 tablet 2   divalproex (DEPAKOTE ER) 500 MG 24 hr tablet Take 2 tablets (1,000 mg total) by mouth daily. 60 tablet 2   QUEtiapine (SEROQUEL) 200 MG tablet Take 1 tablet (200 mg total) by mouth at bedtime. 90 tablet 2   rosuvastatin (CRESTOR) 5 MG tablet TAKE (1) TABLET BY MOUTH ONCE DAILY FOR CHOLESTEROL.  (Patient not taking: Reported on 08/20/2021) 30 tablet 0   No current facility-administered medications for this visit.     Musculoskeletal: Strength & Muscle Tone: within normal limits Gait & Station: normal Patient leans: N/A  Psychiatric Specialty Exam: Review of Systems  All other systems reviewed and are negative.   Blood pressure 119/86, pulse (!) 105, height 5\' 2"  (1.575 m), weight 144 lb 4.8 oz (65.5 kg), last menstrual period 04/13/2015.Body mass index is 26.39 kg/m.  General Appearance: Casual and Fairly Groomed  Eye Contact:  Good  Speech:  Clear and Coherent  Volume:  Normal  Mood:  Euthymic  Affect:  Congruent  Thought Process:  Goal Directed  Orientation:  Full (Time, Place, and Person)  Thought Content: WDL   Suicidal Thoughts:  No  Homicidal Thoughts:  No  Memory:  Immediate;   Good Recent;   Good Remote;   Good  Judgement:  Good  Insight:  Good  Psychomotor Activity:  Normal  Concentration:  Concentration: Good and Attention Span: Good  Recall:  Good  Fund of Knowledge: Good  Language: Good  Akathisia:  No  Handed:  Right  AIMS (if indicated): not done  Assets:  Communication Skills Desire for Improvement Physical Health Resilience Social Support Talents/Skills  ADL's:  Intact  Cognition: WNL  Sleep:  Good   Screenings: PHQ2-9    Flowsheet Row [facility] Video Visit from 05/18/2021 in [facility] Video Visit from 02/18/2021 in [facility] Video Visit from 11[facility]20/2020 in [facility] Video Visit from 08/21/2020 in [facility]  PHQ-2 Total Score 1 1 1  0 0  PHQ-9 Total Score 22 -- -- -- --      Flowsheet Row Office Visit from 08/20/2021 in [facility] Video Visit from  05/18/2021 in [facility] Video Visit from 02/18/2021 in [facility]  C-SSRS RISK CATEGORY No Risk No Risk No Risk        Assessment and Plan: This patient is a 56 year old female with a history of bipolar disorder anxiety and insomnia.  For the most part she is doing well on her current regimen.  Her PHQ-9 score was high but when questioned further she states she is "doing okay."  She will continue Seroquel 200 mg at bedtime for mood stabilization and sleep, Xanax 1 mg 3 times daily for anxiety and Depakote ER 1000 mg at bedtime for mood stabilization.  She will return to see me in 3 months  Collaboration of Care: Collaboration of Care: Primary Care Provider AEB notes will be shared with PCP and patient's request  Patient/Guardian was advised Release of Information must be obtained prior to any record release in order to collaborate their care with an outside provider. Patient/Guardian was advised if they have not already done so to contact the registration department to sign all necessary forms in order for 59 to release information regarding their care.   Consent: Patient/Guardian gives verbal consent for treatment and assignment of benefits for services provided during this visit. Patient/Guardian expressed understanding and agreed to proceed.    [place], MD 08/20/2021, 4:59 PM

## 2021-09-25 ENCOUNTER — Ambulatory Visit (INDEPENDENT_AMBULATORY_CARE_PROVIDER_SITE_OTHER): Payer: PRIVATE HEALTH INSURANCE | Admitting: Nurse Practitioner

## 2021-09-25 VITALS — BP 122/88 | HR 96 | Temp 97.3°F | Ht 62.0 in | Wt 138.0 lb

## 2021-09-25 DIAGNOSIS — M549 Dorsalgia, unspecified: Secondary | ICD-10-CM | POA: Diagnosis not present

## 2021-09-25 DIAGNOSIS — M7661 Achilles tendinitis, right leg: Secondary | ICD-10-CM

## 2021-09-25 NOTE — Patient Instructions (Signed)
Achilles Tendinitis Rehab Ask your health care provider which exercises are safe for you. Do exercises exactly as told by your health care provider and adjust them as directed. It is normal to feel mild stretching, pulling, tightness, or discomfort as you do these exercises. Stop right away if you feel sudden pain or your pain gets worse. Do not begin these exercises until told by your health care provider. Stretching and range-of-motion exercises These exercises warm up your muscles and joints and improve the movement and flexibility of your ankle. These exercises also help to relieve pain. Standing wall calf stretch with straight knee  Stand with your hands against a wall. Extend your left / right leg behind you, and bend your front knee slightly. Keep both of your heels on the floor. Point the toes of your back foot slightly inward. Keeping your heels on the floor and your back knee straight, shift your weight toward the wall. Do not allow your back to arch. You should feel a gentle stretch in your upper calf. Hold this position for __________ seconds. Repeat __________ times. Complete this exercise __________ times a day. Standing wall calf stretch with bent knee Stand with your hands against a wall. Extend your left / right leg behind you, and bend your front knee slightly. Keep both of your heels on the floor. Point the toes of your back foot slightly inward. Keeping your heels on the floor, bend your back knee slightly. You should feel a gentle stretch deep in your lower calf near your heel. Hold this position for __________ seconds. Repeat __________ times. Complete this exercise __________ times a day. Strengthening exercises These exercises build strength and control of your ankle. Endurance is the ability to use your muscles for a long time, even after they get tired. Plantar flexion with band In this exercise, you push your toes downward, away from you, with an exercise band  providing resistance. Sit on the floor with your left / right leg extended. You may put a pillow under your calf to give your foot more room to move. Loop a rubber exercise band or tube around the ball of your left / right foot. The ball of your foot is on the walking surface, right under your toes. The band or tube should be slightly tense when your foot is relaxed. If the band or tube slips, you can put on your shoe or put a washcloth between the band and your foot to help it stay in place. Slowly point your toes downward, pushing them away from you (plantar flexion). Hold this position for __________ seconds. Slowly release the tension in the band or tube, controlling smoothly until your foot is back to the starting position. Repeat steps 1-5 with your left / right leg. Repeat __________ times. Complete this exercise __________ times a day. Eccentric heel drop  In this exercise, you stand and slowly raise your heel and then slowly lower it. This exercise lengthens the calf muscles (eccentric) while the heel bears weight. If this exercise is too easy, try doing it while wearing a backpack with weights in it. Stand on a step with the balls of your feet. The ball of your foot is on the walking surface, right under your toes. Do not put your heels on the step. For balance, rest your hands on the wall or on a railing. Rise up onto the balls of your feet. Keeping your heels up, shift all of your weight to your left / right leg   and pick up your other leg. Slowly lower your left / right leg so your heel drops below the level of the step. Put down your other foot before returning to the start position. If told by your health care provider, build up to: 3 sets of 15 repetitions while keeping your knees straight. 3 sets of 15 repetitions while keeping your knees slightly bent as far as told by your health care provider. Repeat __________ times. Complete this exercise __________ times a day. Balance  exercises These exercises improve or maintain your balance. Balance is important in preventing falls. Single leg stand If this exercise is too easy, you can try it with your eyes closed or while standing on a pillow. Without shoes, stand near a railing or in a door frame. Hold on to the railing or door frame as needed. Stand on your left / right foot. Keep your big toe down on the floor and try to keep your arch lifted. Hold this position for __________ seconds. Repeat __________ times. Complete this exercise __________ times a day. This information is not intended to replace advice given to you by your health care provider. Make sure you discuss any questions you have with your health care provider. Document Revised: 02/26/2020 Document Reviewed: 02/26/2020 Elsevier Patient Education  2023 [employer]  

## 2021-09-25 NOTE — Progress Notes (Unsigned)
   Subjective:    Patient ID: Anne Logan, female    DOB: 08/21/1965, 56 y.o.   MRN: [id number]  HPI Presents for complaints of back pain that began back in December.  Has been doing significant lifting and pulling with her husband who is disabled.  He fell back around the time when she first noticed the pain.  Intermittent.  Always occurs at nighttime.  Does not occur every night.  Can occur from several minutes to most of the night.  Describes as a stinging sensation that starts at the base of the neck of the cervical spine all the way down to the upper lumbar area.  Does not radiate to any other areas.  No numbness or weakness of the legs.  No change in her abdominal pain.  No change in her bowel or bladder habits.  Has tried OTC analgesics with no relief.  Has started back her walking program and working on her weight for the past 4 to 5 weeks which is greatly helped the pain.  Has seen significant improvement and less episodes which are also shorter.  Gets regular follow-up with gastroenterology.  Continues to smoke half pack per day.  Near the end of the visit patient mentions she has been having some discomfort in the right Achilles area that has been going on for a long time.  No specific history of injury.  Review of Systems  Respiratory:  Negative for cough, chest tightness, shortness of breath and wheezing.   Cardiovascular:  Negative for chest pain and leg swelling.  Musculoskeletal:  Positive for back pain.  Neurological:  Negative for weakness and numbness.       Objective:   Physical Exam NAD.  Alert, oriented.  Lungs clear.  Heart regular rate rhythm.  No tenderness noted with percussion of the spine from the cervical to lumbar area.  No tenderness noted along the lateral spine area bilaterally.  Gait normal.  Can perform full active range of motion of the spine without tenderness or limitation.  Full passive range of motion of the right ankle with no joint laxity.  Mild tenderness  noted with palpation of the Achilles tendon.       Assessment & Plan:   Problem List Items Addressed This Visit       Musculoskeletal and Integument   Achilles tendinitis of right lower extremity     Other   Spinal column pain - Primary   Meds ordered this encounter  Medications   tiZANidine (ZANAFLEX) 4 MG tablet    Sig: Take 1 tablet (4 mg total) by mouth at bedtime. Prn back pain    Dispense:  30 tablet    Refill:  0    Order Specific Question:   Supervising Provider    Answer:   Lilyan Punt A [[id number]]   Cautioned about use of NSAIDs due to history of GERD.  Since symptoms have greatly improved over the past 4 to 5 weeks with activity and walking, we will hold on any further work-up at this time.  Patient to call back if back pain worsens or persist or if new symptoms develop.  Given written and verbal information on therapy for Achilles tendinitis.  Encourage patient to get flu vaccine this fall.  Also recommend wellness exam.

## 2021-09-26 ENCOUNTER — Encounter: Payer: Self-pay | Admitting: Nurse Practitioner

## 2021-09-26 DIAGNOSIS — M549 Dorsalgia, unspecified: Secondary | ICD-10-CM | POA: Insufficient documentation

## 2021-09-26 DIAGNOSIS — M7661 Achilles tendinitis, right leg: Secondary | ICD-10-CM | POA: Insufficient documentation

## 2021-09-26 MED ORDER — TIZANIDINE HCL 4 MG PO TABS: 4.0000 mg | ORAL_TABLET | Freq: Every day | ORAL | 0 refills | Status: DC

## 2021-10-12 ENCOUNTER — Other Ambulatory Visit (HOSPITAL_COMMUNITY): Payer: Self-pay | Admitting: Family Medicine

## 2021-10-12 DIAGNOSIS — Z1231 Encounter for screening mammogram for malignant neoplasm of breast: Secondary | ICD-10-CM

## 2021-10-14 ENCOUNTER — Ambulatory Visit: Payer: No Typology Code available for payment source | Admitting: Physician Assistant

## 2021-11-04 NOTE — Progress Notes (Unsigned)
Primary Care Physician:  Babs Sciara, MD  Primary GI: Dr. Marletta Lor  Patient Location: Home   Provider Location: [facility] office   Reason for Visit: Follow-up   Persons present on the virtual encounter, with roles: Ermalinda Memos, PA-C (Provider), Anne Logan (patient)   Total time (minutes) spent on medical discussion: 5 minutes  Virtual Visit via video note  I connected with [name] on 11/05/21 at  3:30 PM EDT by video and verified that I am speaking with the correct person using two identifiers.   I discussed the limitations, risks, security and privacy concerns of performing an evaluation and management service by video and the availability of in person appointments. I also discussed with the patient that there may be a patient responsible charge related to this service. The patient expressed understanding and agreed to proceed.  Chief Complaint  Patient presents with   Follow-up    Refill for medications      History of Present Illness: 56 y.o. female with history of GERD, epigastric pain, dysphagia with esophageal stricture s/p dilation in 2021, Schatzki's ring s/p dilation October 2022, alternating constipation and diarrhea, presenting today for follow-up.  Last seen in our office 08/05/2021.  Reported dysphagia symptoms improved after esophageal dilation initially, but seemed to be slowly returning.  Only having trouble with pills.  She was not interested in further evaluation unless symptoms worsened.  Typical reflux symptoms were well controlled on Aciphex twice daily.  She was having intermittent belching a few times a week, postprandially, worse with spicy foods.  Ongoing intermittent postprandial epigastric abdominal pain without specific triggers and no change over the last few years.  Prior imaging in 2020 with ultrasound with gallstones, CT with no significant abnormalities, gastric emptying study normal.  Recent EGD in October 2022 with gastritis.  Planned to  try changing PPI to Dexilant to see if this provided any additional improvement in upper GI symptoms. Otherwise, patient was not interested in any further evaluation.  Alternating bowel habits had resolved.   Today:  Feeling much better since starting Dexilant 60 mg daily.  Epigastric pain has resolved.  GERD is well controlled.  Intermittent dysphagia to pills only and does not feels this needs any further evaluation at this time.  Denies nausea or vomiting, BRBPR or melena.  Bowels are moving well.   Past Medical History:  Diagnosis Date   Anxiety    Depression    GERD (gastroesophageal reflux disease)      Past Surgical History:  Procedure Laterality Date   BALLOON DILATION N/A 10/28/2020   Procedure: BALLOON DILATION;  Surgeon: Lanelle Bal, DO;  Location: [facility];  Service: Endoscopy;  Laterality: N/A;   BIOPSY  01/23/2019   Procedure: BIOPSY;  Surgeon: West Bali, MD;  Location: [facility];  Service: Endoscopy;;  duodenum gastric   BIOPSY  10/28/2020   Procedure: BIOPSY;  Surgeon: Lanelle Bal, DO;  Location: [facility];  Service: Endoscopy;;   COLONOSCOPY WITH PROPOFOL N/A 01/23/2019   Dr. Darrick Penna: Significant looping of the colon, internal hemorrhoids, moderate diverticulosis with restricted mobility of the rectosigmoid colon.  Recommended 5-year follow-up colonoscopy due to prep, polyps less than 3 mm could have been missed.   ESOPHAGOGASTRODUODENOSCOPY (EGD) WITH PROPOFOL N/A 01/23/2019   Dr. Darrick Penna: Benign-appearing esophageal stricture status post dilation.  Gastritis, no H. pylori.  Small bowel biopsies benign, no celiac.   ESOPHAGOGASTRODUODENOSCOPY (EGD) WITH PROPOFOL N/A 10/28/2020   Surgeon: Marletta Lor,  Charles K, DO;  mild Schatzki's ring s/p dilation, gastritis biopsied, normal examined duodenum.  Pathology with antral and oxyntic mucosa with slight chronic inflammation, negative for H. pylori.   SAVORY DILATION N/A 01/23/2019   Procedure: SAVORY  DILATION;  Surgeon: Danie Binder, MD;  Location: [facility];  Service: Endoscopy;  Laterality: N/A;   TUBAL LIGATION Bilateral 2005     Current Meds  Medication Sig   ALPRAZolam (XANAX) 1 MG tablet Take 1 tablet (1 mg total) by mouth 3 (three) times daily as needed for anxiety.   aspirin-acetaminophen-caffeine (EXCEDRIN MIGRAINE) 250-250-65 MG tablet Take 2 tablets by mouth every 8 (eight) hours as needed for headache.    divalproex (DEPAKOTE ER) 500 MG 24 hr tablet Take 2 tablets (1,000 mg total) by mouth daily.   Omega-3 Fatty Acids (FISH OIL PO) Take by mouth.   QUEtiapine (SEROQUEL) 200 MG tablet Take 1 tablet (200 mg total) by mouth at bedtime.   tiZANidine (ZANAFLEX) 4 MG tablet Take 1 tablet (4 mg total) by mouth at bedtime. Prn back pain   [DISCONTINUED] dexlansoprazole (DEXILANT) 60 MG capsule Take 1 capsule (60 mg total) by mouth daily.     Family History  Problem Relation Age of Onset   Depression Mother    Cancer Mother        lymphoma   Alcohol abuse Father    Drug abuse Son    Colon cancer Neg Hx     Social History   Socioeconomic History   Marital status: Married    Spouse name: Not on file   Number of children: Not on file   Years of education: Not on file   Highest education level: Not on file  Occupational History   Not on file  Tobacco Use   Smoking status: Every Day    Packs/day: 0.50    Years: 20.00    Total pack years: 10.00    Types: Cigarettes   Smokeless tobacco: Never   Tobacco comments:    smokes 10 cig daily  Vaping Use   Vaping Use: Never used  Substance and Sexual Activity   Alcohol use: Not Currently    Alcohol/week: 0.0 standard drinks of alcohol    Comment: quit 2 years ago   Drug use: No   Sexual activity: Not Currently    Partners: Male    Birth control/protection: Surgical    Comment: tubal  Other Topics Concern   Not on file  Social History Narrative   Not on file   Social Determinants of Health   Financial  Resource Strain: Not on file  Food Insecurity: Not on file  Transportation Needs: Not on file  Physical Activity: Not on file  Stress: Not on file  Social Connections: Not on file       Review of Systems: Gen: Denies fever, chills, cold or flu like symptoms, pre-syncope, or syncope.   CV: Denies chest pain, palpitations. Resp: Denies dyspnea, cough.  GI: see HPI Heme: See HPI  Observations/Objective: No distress. Alert and oriented. Pleasant. Well nourished. Normal mood and affect. Unable to perform complete physical exam due to video encounter.    Assessment:  56 year old female with history of GERD, epigastric pain, dysphagia with esophageal stricture s/p dilation in 2021, Schatzki's ring s/p dilation in October 2022, alternating constipation and diarrhea, presenting today for follow-up of GERD and epigastric pain.  GERD: Well-controlled on Dexilant 60 mg daily.  Epigastric pain: Present for a few years.  Extensive evaluation  previously with EGD, ultrasound, CT, gastric emptying study that was unrevealing aside from gastritis on EGD.  Symptoms have now resolved with Dexilant 60 mg daily.   Dysphagia: Intermittent pill dysphagia, otherwise no food or liquid dysphagia.  Last EGD October 2022 with mild Schatzki's ring s/p dilation.  Patient does not feel she needs any further evaluation/management at this time.    Plan: Continue Dexilant 60 mg daily. Reinforced GERD diet/lifestyle.  Separate written instructions provided on AVS. Patient will monitor for any worsening dysphagia symptoms and let us know if this occurs. Follow-up in 1 year or sooner if needed.     I discussed the assessment and treatment plan with the patient. The patient was provided an opportunity to ask questions and all were answered. The patient agreed with the plan and demonstrated an understanding of the instructions.   The patient was advised to call back or seek an in-person evaluation if the  symptoms worsen or if the condition fails to improve as anticipated.  I provided 5 minutes of video-face-to-face time during this encounter.  Ermalinda Memos, PA-C [facility]  11/05/2021

## 2021-11-05 ENCOUNTER — Ambulatory Visit: Payer: PRIVATE HEALTH INSURANCE | Admitting: Gastroenterology

## 2021-11-05 ENCOUNTER — Encounter: Payer: Self-pay | Admitting: Gastroenterology

## 2021-11-05 ENCOUNTER — Telehealth (INDEPENDENT_AMBULATORY_CARE_PROVIDER_SITE_OTHER): Payer: PRIVATE HEALTH INSURANCE | Admitting: Gastroenterology

## 2021-11-05 VITALS — Ht 62.0 in | Wt 135.0 lb

## 2021-11-05 DIAGNOSIS — R1310 Dysphagia, unspecified: Secondary | ICD-10-CM

## 2021-11-05 DIAGNOSIS — K219 Gastro-esophageal reflux disease without esophagitis: Secondary | ICD-10-CM | POA: Diagnosis not present

## 2021-11-05 DIAGNOSIS — R1013 Epigastric pain: Secondary | ICD-10-CM

## 2021-11-05 MED ORDER — DEXLANSOPRAZOLE 60 MG PO CPDR: 60.0000 mg | DELAYED_RELEASE_CAPSULE | Freq: Every day | ORAL | 3 refills | Status: DC

## 2021-11-05 NOTE — Patient Instructions (Signed)
Continue Dexilant 60 mg daily.  Follow a GERD diet:  Avoid fried, fatty, greasy, spicy, citrus foods. Avoid caffeine and carbonated beverages. Avoid chocolate. Try eating 4-6 small meals a day rather than 3 large meals. Do not eat within 3 hours of laying down. Prop head of bed up on wood or bricks to create a 6 inch incline.  Monitor for any worsening swallowing trouble and let us know if this occurs.  We will plan to follow-up with you in 1 year or sooner if needed.  I am glad you are doing much better!  Aliene Altes, PA-C [facility]

## 2021-11-11 ENCOUNTER — Telehealth: Payer: Self-pay | Admitting: *Deleted

## 2021-11-11 NOTE — Telephone Encounter (Signed)
Noted. When I saw her last, bowels were moving well without Linzess. No further recommendations.

## 2021-11-11 NOTE — Telephone Encounter (Signed)
Received medication prior authorization form for Linzess 124mcg. Spoke to pt, she informed me that she is not taking Linzess, because it was to expensive. She states her co-pay was over 300$ a month.

## 2021-11-11 NOTE — Telephone Encounter (Signed)
Noted  

## 2021-11-12 ENCOUNTER — Ambulatory Visit (HOSPITAL_COMMUNITY): Payer: PRIVATE HEALTH INSURANCE

## 2021-11-14 ENCOUNTER — Other Ambulatory Visit: Payer: Self-pay | Admitting: Gastroenterology

## 2021-11-14 DIAGNOSIS — K219 Gastro-esophageal reflux disease without esophagitis: Secondary | ICD-10-CM

## 2021-11-16 ENCOUNTER — Ambulatory Visit (HOSPITAL_COMMUNITY)
Admission: RE | Admit: 2021-11-16 | Discharge: 2021-11-16 | Disposition: A | Discharge status: Home / Self Care | Payer: PRIVATE HEALTH INSURANCE | Source: Ambulatory Visit | Attending: Family Medicine | Admitting: Family Medicine

## 2021-11-16 ENCOUNTER — Encounter (HOSPITAL_COMMUNITY): Payer: Self-pay

## 2021-11-16 DIAGNOSIS — Z1231 Encounter for screening mammogram for malignant neoplasm of breast: Secondary | ICD-10-CM | POA: Insufficient documentation

## 2021-11-20 ENCOUNTER — Ambulatory Visit (INDEPENDENT_AMBULATORY_CARE_PROVIDER_SITE_OTHER): Payer: PRIVATE HEALTH INSURANCE | Admitting: Nurse Practitioner

## 2021-11-20 VITALS — BP 119/79 | HR 94 | Temp 97.5°F | Ht 62.0 in | Wt 136.0 lb

## 2021-11-20 DIAGNOSIS — Z114 Encounter for screening for human immunodeficiency virus [HIV]: Secondary | ICD-10-CM | POA: Diagnosis not present

## 2021-11-20 DIAGNOSIS — Z1151 Encounter for screening for human papillomavirus (HPV): Secondary | ICD-10-CM

## 2021-11-20 DIAGNOSIS — Z124 Encounter for screening for malignant neoplasm of cervix: Secondary | ICD-10-CM | POA: Diagnosis not present

## 2021-11-20 DIAGNOSIS — B9689 Other specified bacterial agents as the cause of diseases classified elsewhere: Secondary | ICD-10-CM

## 2021-11-20 DIAGNOSIS — Z01419 Encounter for gynecological examination (general) (routine) without abnormal findings: Secondary | ICD-10-CM | POA: Diagnosis not present

## 2021-11-20 DIAGNOSIS — J069 Acute upper respiratory infection, unspecified: Secondary | ICD-10-CM

## 2021-11-20 DIAGNOSIS — R9230 Dense breasts, unspecified: Secondary | ICD-10-CM

## 2021-11-20 DIAGNOSIS — R922 Inconclusive mammogram: Secondary | ICD-10-CM

## 2021-11-20 DIAGNOSIS — Z1159 Encounter for screening for other viral diseases: Secondary | ICD-10-CM

## 2021-11-20 DIAGNOSIS — Z13220 Encounter for screening for lipoid disorders: Secondary | ICD-10-CM

## 2021-11-20 MED ORDER — AMOXICILLIN-POT CLAVULANATE 875-125 MG PO TABS: 1.0000 | ORAL_TABLET | Freq: Two times a day (BID) | ORAL | 0 refills | Status: DC
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
## 2021-11-21 ENCOUNTER — Encounter: Payer: Self-pay | Admitting: Nurse Practitioner

## 2021-11-26 LAB — IGP, APTIMA HPV: HPV Aptima: NEGATIVE

## 2021-11-27 ENCOUNTER — Ambulatory Visit (INDEPENDENT_AMBULATORY_CARE_PROVIDER_SITE_OTHER): Payer: PRIVATE HEALTH INSURANCE | Admitting: Family Medicine

## 2021-11-27 VITALS — BP 110/70 | HR 99 | Temp 97.0°F | Ht 62.0 in | Wt 134.0 lb

## 2021-11-27 DIAGNOSIS — H6121 Impacted cerumen, right ear: Secondary | ICD-10-CM | POA: Diagnosis not present

## 2021-11-27 MED ORDER — CIPROFLOXACIN-DEXAMETHASONE 0.3-0.1 % OT SUSP: 4.0000 [drp] | Freq: Two times a day (BID) | OTIC | 0 refills | Status: AC

## 2021-11-27 NOTE — Patient Instructions (Signed)
Finish the antibiotic.  Drop as prescribed.  If continues to be troublesome, please let us know.  Take care  Dr. Adriana Simas

## 2021-11-28 LAB — CBC WITH DIFFERENTIAL/PLATELET
Basophils Absolute: 0.1 10*3/uL (ref 0.0–0.2)
Basos: 1 %
EOS (ABSOLUTE): 0.2 10*3/uL (ref 0.0–0.4)
Eos: 4 %
Hematocrit: 41.1 % (ref 34.0–46.6)
Hemoglobin: 13.6 g/dL (ref 11.1–15.9)
Immature Grans (Abs): 0 10*3/uL (ref 0.0–0.1)
Immature Granulocytes: 0 %
Lymphocytes Absolute: 1.6 10*3/uL (ref 0.7–3.1)
Lymphs: 33 %
MCH: 30.2 pg (ref 26.6–33.0)
MCHC: 33.1 g/dL (ref 31.5–35.7)
MCV: 91 fL (ref 79–97)
Monocytes Absolute: 0.5 10*3/uL (ref 0.1–0.9)
Monocytes: 10 %
Neutrophils Absolute: 2.5 10*3/uL (ref 1.4–7.0)
Neutrophils: 52 %
Platelets: 271 10*3/uL (ref 150–450)
RBC: 4.5 x10E6/uL (ref 3.77–5.28)
RDW: 14.8 % (ref 11.7–15.4)
WBC: 4.8 10*3/uL (ref 3.4–10.8)

## 2021-11-28 LAB — LIPID PANEL
Chol/HDL Ratio: 7.4 ratio — ABNORMAL HIGH (ref 0.0–4.4)
Cholesterol, Total: 252 mg/dL — ABNORMAL HIGH (ref 100–199)
HDL: 34 mg/dL — ABNORMAL LOW (ref >39)
LDL Chol Calc (NIH): 171 mg/dL — ABNORMAL HIGH (ref 0–99)
Triglycerides: 249 mg/dL — ABNORMAL HIGH (ref 0–149)
VLDL Cholesterol Cal: 47 mg/dL — ABNORMAL HIGH (ref 5–40)

## 2021-11-28 LAB — COMPREHENSIVE METABOLIC PANEL
ALT: 9 IU/L (ref 0–32)
AST: 12 IU/L (ref 0–40)
Albumin/Globulin Ratio: 1.8 (ref 1.2–2.2)
Albumin: 4.2 g/dL (ref 3.8–4.9)
Alkaline Phosphatase: 67 IU/L (ref 44–121)
BUN/Creatinine Ratio: 27 — ABNORMAL HIGH (ref 9–23)
BUN: 16 mg/dL (ref 6–24)
Bilirubin Total: <0.2 mg/dL (ref 0.0–1.2)
CO2: 25 mmol/L (ref 20–29)
Calcium: 9.4 mg/dL (ref 8.7–10.2)
Chloride: 104 mmol/L (ref 96–106)
Creatinine, Ser: 0.6 mg/dL (ref 0.57–1.00)
Globulin, Total: 2.4 g/dL (ref 1.5–4.5)
Glucose: 117 mg/dL — ABNORMAL HIGH (ref 70–99)
Potassium: 4.3 mmol/L (ref 3.5–5.2)
Sodium: 142 mmol/L (ref 134–144)
Total Protein: 6.6 g/dL (ref 6.0–8.5)
eGFR: 105 mL/min/{1.73_m2} (ref >59)

## 2021-11-28 LAB — HIV ANTIBODY (ROUTINE TESTING W REFLEX): HIV Screen 4th Generation wRfx: NONREACTIVE

## 2021-11-28 LAB — TSH: TSH: 0.697 u[IU]/mL (ref 0.450–4.500)

## 2021-11-28 LAB — HEPATITIS C ANTIBODY: Hep C Virus Ab: NONREACTIVE

## 2021-11-30 DIAGNOSIS — H6120 Impacted cerumen, unspecified ear: Secondary | ICD-10-CM | POA: Insufficient documentation

## 2021-11-30 NOTE — Progress Notes (Signed)
Subjective:  Patient ID: Anne Logan, female    DOB: 09-07-1965  Age: 56 y.o. MRN: [id number]  CC: Chief Complaint  Patient presents with   Ear Fullness    HPI:  56 year old female presents for evaluation of the above.  Patient reports that she has ear fullness and feels like her ears are stopped up.  Has had ongoing sore throat and runny nose.  Was placed on Augmentin on 11/3.  Continues to have symptoms.  No relieving factors.  No fever.  No other complaints or concerns at this time.  Patient Active Problem List   Diagnosis Date Noted   Cerumen impaction 11/30/2021   Dense breasts 11/20/2021   Abdominal pain, epigastric 08/05/2021   Chronic nonintractable headache 06/12/2020   Gastroesophageal reflux disease    Tobacco use 11/17/2018   Encounter for screening colonoscopy 10/25/2018   Mixed stress and urge urinary incontinence 01/31/2017   Bipolar disorder, unspecified (HCC) 09/11/2012    Social Hx   Social History   Socioeconomic History   Marital status: Married    Spouse name: Not on file   Number of children: Not on file   Years of education: Not on file   Highest education level: Not on file  Occupational History   Not on file  Tobacco Use   Smoking status: Every Day    Packs/day: 0.50    Years: 20.00    Total pack years: 10.00    Types: Cigarettes   Smokeless tobacco: Never   Tobacco comments:    smokes 10 cig daily  Vaping Use   Vaping Use: Never used  Substance and Sexual Activity   Alcohol use: Not Currently    Alcohol/week: 0.0 standard drinks of alcohol    Comment: quit 2 years ago   Drug use: No   Sexual activity: Not Currently    Partners: Male    Birth control/protection: Surgical    Comment: tubal  Other Topics Concern   Not on file  Social History Narrative   Not on file   Social Determinants of Health   Financial Resource Strain: Not on file  Food Insecurity: Not on file  Transportation Needs: Not on file  Physical Activity:  Not on file  Stress: Not on file  Social Connections: Not on file    Review of Systems Per HPI  Objective:  BP 110/70   Pulse 99   Temp (!) 97 F (36.1 C)   Ht 5\' 2"  (1.575 m)   Wt 134 lb (60.8 kg)   LMP 04/13/2015   SpO2 99%   BMI 24.51 kg/m      11/27/2021    9:24 AM 11/20/2021    1:46 PM 11/05/2021    2:39 PM  BP/Weight  Systolic BP 110 119   Diastolic BP 70 79   Wt. (Lbs) 134 136 135  BMI 24.51 kg/m2 24.87 kg/m2 24.69 kg/m2    Physical Exam Vitals and nursing note reviewed.  Constitutional:      General: She is not in acute distress. HENT:     Head: Normocephalic and atraumatic.     Ears:     Comments: Right TM obscured by impacted cerumen. Left ear -canal is very erythematous. Eyes:     General:        Right eye: No discharge.        Left eye: No discharge.     Conjunctiva/sclera: Conjunctivae normal.  Cardiovascular:     Rate and Rhythm: Normal rate and  regular rhythm.  Pulmonary:     Effort: Pulmonary effort is normal.     Breath sounds: Normal breath sounds.  Neurological:     Mental Status: She is alert.     Lab Results  Component Value Date   WBC 4.8 11/27/2021   HGB 13.6 11/27/2021   HCT 41.1 11/27/2021   PLT 271 11/27/2021   GLUCOSE 117 (H) 11/27/2021   CHOL 252 (H) 11/27/2021   TRIG 249 (H) 11/27/2021   HDL 34 (L) 11/27/2021   LDLCALC 171 (H) 11/27/2021   ALT 9 11/27/2021   AST 12 11/27/2021   NA 142 11/27/2021   K 4.3 11/27/2021   CL 104 11/27/2021   CREATININE 0.60 11/27/2021   BUN 16 11/27/2021   CO2 25 11/27/2021   TSH 0.697 11/27/2021   HGBA1C 5.9 (H) 11/08/2019     Assessment & Plan:   Problem List Items Addressed This Visit       Nervous and Auditory   Cerumen impaction - Primary    Successful ear lavage today. Additionally, placing on Ciprodex to cover for otitis externa of the left ear given significant erythema on exam.       Meds ordered this encounter  Medications   ciprofloxacin-dexamethasone  (CIPRODEX) OTIC suspension    Sig: Place 4 drops into the left ear 2 (two) times daily for 7 days.    Dispense:  7.5 mL    Refill:  0    Janett Kamath DO [facility]

## 2021-11-30 NOTE — Assessment & Plan Note (Signed)
Successful ear lavage today. Additionally, placing on Ciprodex to cover for otitis externa of the left ear given significant erythema on exam.

## 2021-12-02 ENCOUNTER — Telehealth: Payer: Self-pay

## 2021-12-02 NOTE — Telephone Encounter (Signed)
Patient received her lab results and information , she state she used to be on cholesterol meds back then , then changed to fish oil. Please send medication to [facility].

## 2021-12-04 ENCOUNTER — Other Ambulatory Visit: Payer: Self-pay | Admitting: Nurse Practitioner

## 2021-12-04 DIAGNOSIS — Z79899 Other long term (current) drug therapy: Secondary | ICD-10-CM

## 2021-12-04 DIAGNOSIS — E781 Pure hyperglyceridemia: Secondary | ICD-10-CM | POA: Insufficient documentation

## 2021-12-04 DIAGNOSIS — E785 Hyperlipidemia, unspecified: Secondary | ICD-10-CM | POA: Insufficient documentation

## 2021-12-04 MED ORDER — ROSUVASTATIN CALCIUM 10 MG PO TABS: 10.0000 mg | ORAL_TABLET | Freq: Every day | ORAL | 0 refills | Status: DC

## 2021-12-04 NOTE — Telephone Encounter (Signed)
Patient has been informed per provider result notes and recommendations, she verbalizes understanding.

## 2021-12-07 ENCOUNTER — Ambulatory Visit (INDEPENDENT_AMBULATORY_CARE_PROVIDER_SITE_OTHER): Payer: PRIVATE HEALTH INSURANCE | Admitting: Family Medicine

## 2021-12-07 VITALS — BP 128/82 | Ht 62.0 in | Wt 134.6 lb

## 2021-12-07 DIAGNOSIS — N3001 Acute cystitis with hematuria: Secondary | ICD-10-CM | POA: Diagnosis not present

## 2021-12-07 LAB — POCT URINALYSIS DIPSTICK
Spec Grav, UA: 1.015 (ref 1.010–1.025)
pH, UA: 6.5 (ref 5.0–8.0)

## 2021-12-07 MED ORDER — CEPHALEXIN 500 MG PO CAPS: 500.0000 mg | ORAL_CAPSULE | Freq: Two times a day (BID) | ORAL | 0 refills | Status: DC

## 2021-12-07 MED ORDER — FLUCONAZOLE 150 MG PO TABS: 150.0000 mg | ORAL_TABLET | Freq: Once | ORAL | 0 refills | Status: AC

## 2021-12-07 NOTE — Progress Notes (Signed)
Subjective:  Patient ID: Anne Logan, female    DOB: 1965-12-10  Age: 56 y.o. MRN: [id number]  CC: Chief Complaint  Patient presents with   Vaginitis    Patient was recently on Augmentin for her ears    HPI:  56 year old female presents with complaints of dysuria and hematuria.  Patient reports that her symptoms started late last night/early this morning.  She reports dysuria and blood when wiping.  She reports some associated suprapubic pain as well as low back pain.  No vaginal discharge.  No vaginal itching.  No fever.  No relieving factors.   Patient Active Problem List   Diagnosis Date Noted   Acute cystitis with hematuria 12/07/2021   Hyperlipidemia 12/04/2021   Dense breasts 11/20/2021   Chronic nonintractable headache 06/12/2020   Gastroesophageal reflux disease    Tobacco use 11/17/2018   Mixed stress and urge urinary incontinence 01/31/2017   Bipolar disorder, unspecified ([place]) 09/11/2012    Social Hx   Social History   Socioeconomic History   Marital status: Married    Spouse name: Not on file   Number of children: Not on file   Years of education: Not on file   Highest education level: Not on file  Occupational History   Not on file  Tobacco Use   Smoking status: Every Day    Packs/day: 0.50    Years: 20.00    Total pack years: 10.00    Types: Cigarettes   Smokeless tobacco: Never   Tobacco comments:    smokes 10 cig daily  Vaping Use   Vaping Use: Never used  Substance and Sexual Activity   Alcohol use: Not Currently    Alcohol/week: 0.0 standard drinks of alcohol    Comment: quit 2 years ago   Drug use: No   Sexual activity: Not Currently    Partners: Male    Birth control/protection: Surgical    Comment: tubal  Other Topics Concern   Not on file  Social History Narrative   Not on file   Social Determinants of Health   Financial Resource Strain: Not on file  Food Insecurity: Not on file  Transportation Needs: Not on file   Physical Activity: Not on file  Stress: Not on file  Social Connections: Not on file    Review of Systems Per HPI  Objective:  BP 128/82   Ht 5\' 2"  (1.575 m)   Wt 134 lb 9.6 oz (61.1 kg)   LMP 04/13/2015   BMI 24.62 kg/m      12/07/2021    3:38 PM 11/27/2021    9:24 AM 11/20/2021    1:46 PM  BP/Weight  Systolic BP 0000000 [id number]  Diastolic BP 82 70 79  Wt. (Lbs) 134.6 134 136  BMI 24.62 kg/m2 24.51 kg/m2 24.87 kg/m2    Physical Exam Vitals and nursing note reviewed.  Constitutional:      General: She is not in acute distress.    Appearance: Normal appearance.  HENT:     Head: Normocephalic and atraumatic.  Cardiovascular:     Rate and Rhythm: Normal rate and regular rhythm.  Pulmonary:     Effort: Pulmonary effort is normal.     Breath sounds: Normal breath sounds. No wheezing, rhonchi or rales.  Abdominal:     Comments: Soft, nondistended.  Mild suprapubic tenderness to palpation.  Neurological:     Mental Status: She is alert.     Lab Results  Component Value Date  WBC 4.8 11/27/2021   HGB 13.6 11/27/2021   HCT 41.1 11/27/2021   PLT 271 11/27/2021   GLUCOSE 117 (H) 11/27/2021   CHOL 252 (H) 11/27/2021   TRIG 249 (H) 11/27/2021   HDL 34 (L) 11/27/2021   LDLCALC 171 (H) 11/27/2021   ALT 9 11/27/2021   AST 12 11/27/2021   NA 142 11/27/2021   K 4.3 11/27/2021   CL 104 11/27/2021   CREATININE 0.60 11/27/2021   BUN 16 11/27/2021   CO2 25 11/27/2021   TSH 0.697 11/27/2021   HGBA1C 5.9 (H) 11/08/2019     Assessment & Plan:   Problem List Items Addressed This Visit       Genitourinary   Acute cystitis with hematuria - Primary    History and urinalysis consistent with UTI.  Sending culture.  Placing on Keflex.  Diflucan to be used if needed in the event that she develops yeast vaginitis for antibiotic therapy.      Relevant Orders   POCT urinalysis dipstick (Completed)   Urine Culture    Meds ordered this encounter  Medications    cephALEXin (KEFLEX) 500 MG capsule    Sig: Take 1 capsule (500 mg total) by mouth 2 (two) times daily.    Dispense:  14 capsule    Refill:  0   fluconazole (DIFLUCAN) 150 MG tablet    Sig: Take 1 tablet (150 mg total) by mouth once for 1 dose. Repeat dose in 72 hours.    Dispense:  2 tablet    Refill:  0    Follow-up:  Return if symptoms worsen or fail to improve.  Everlene Other DO [facility]

## 2021-12-07 NOTE — Assessment & Plan Note (Signed)
History and urinalysis consistent with UTI.  Sending culture.  Placing on Keflex.  Diflucan to be used if needed in the event that she develops yeast vaginitis for antibiotic therapy.

## 2021-12-10 LAB — URINE CULTURE

## 2021-12-11 ENCOUNTER — Other Ambulatory Visit: Payer: Self-pay | Admitting: Family Medicine

## 2021-12-11 MED ORDER — NITROFURANTOIN MONOHYD MACRO 100 MG PO CAPS: 100.0000 mg | ORAL_CAPSULE | Freq: Two times a day (BID) | ORAL | 0 refills | Status: DC

## 2021-12-12 ENCOUNTER — Other Ambulatory Visit (HOSPITAL_COMMUNITY): Payer: Self-pay | Admitting: Psychiatry

## 2021-12-14 NOTE — Progress Notes (Signed)
Patient notified and verbalized understanding. 

## 2021-12-14 NOTE — Progress Notes (Signed)
Anne Sams, DO     Please make sure patient is aware of culture results. I sent it in mychart. Stop Keflex. Start Macrobid.

## 2021-12-15 ENCOUNTER — Other Ambulatory Visit (HOSPITAL_COMMUNITY): Payer: Self-pay | Admitting: Psychiatry

## 2021-12-15 ENCOUNTER — Telehealth (HOSPITAL_COMMUNITY): Payer: Self-pay

## 2021-12-15 MED ORDER — ALPRAZOLAM 1 MG PO TABS: 1.0000 mg | ORAL_TABLET | Freq: Three times a day (TID) | ORAL | 2 refills | Status: DC | PRN

## 2021-12-15 NOTE — Telephone Encounter (Signed)
Pt states that she needs a refill on her Xanax. Last appt in August 2023. Next appt in January 2024. [facility] .

## 2021-12-22 ENCOUNTER — Ambulatory Visit (INDEPENDENT_AMBULATORY_CARE_PROVIDER_SITE_OTHER): Payer: PRIVATE HEALTH INSURANCE | Admitting: Family Medicine

## 2021-12-22 VITALS — BP 130/80 | Temp 97.5°F | Ht 62.0 in | Wt 134.0 lb

## 2021-12-22 DIAGNOSIS — B349 Viral infection, unspecified: Secondary | ICD-10-CM

## 2021-12-22 DIAGNOSIS — J029 Acute pharyngitis, unspecified: Secondary | ICD-10-CM | POA: Diagnosis not present

## 2021-12-22 LAB — POCT RAPID STREP A (OFFICE): Rapid Strep A Screen: NEGATIVE

## 2021-12-22 NOTE — Progress Notes (Signed)
   Subjective:    Patient ID: Anne Logan, female    DOB: 1965-11-16, 56 y.o.   MRN: [id number]  Cough This is a new problem. The current episode started in the past 7 days. Associated symptoms include ear pain, nasal congestion and a sore throat.  Little bit of cough clear runny nose some sore throat she is worried that she has a more serious infection was interested in antibiotics.  Denies any high fever chills or sweats.  States the pain in the throat very severe    Review of Systems  HENT:  Positive for ear pain and sore throat.   Respiratory:  Positive for cough.        Objective:   Physical Exam  Throat minimal erythema neck no masses lungs clear no crackles      Assessment & Plan:  Patient will do home COVID Rapid strep negative Supportive measures discussed Follow-up if progressive troubles If not dramatically better by next week notify us may have to send in an antibiotic if this turns into a sinus infection but currently I believe it is more of a virus it should just run its course Warning signs discussed

## 2021-12-25 LAB — CULTURE, GROUP A STREP: Strep A Culture: NEGATIVE

## 2021-12-25 LAB — SPECIMEN STATUS REPORT

## 2021-12-29 ENCOUNTER — Telehealth: Payer: Self-pay

## 2021-12-29 MED ORDER — AMOXICILLIN-POT CLAVULANATE 875-125 MG PO TABS: 1.0000 | ORAL_TABLET | Freq: Two times a day (BID) | ORAL | 0 refills | Status: DC

## 2021-12-29 NOTE — Telephone Encounter (Signed)
Prescription sent electronically to pharmacy. Patient notified. 

## 2021-12-29 NOTE — Telephone Encounter (Signed)
Caller name: HILDA RYNDERS  On DPR?: Yes  Call back number: [phone] (mobile)  Provider they see: Babs Sciara, MD  Reason for call:Temari Duffy Rhody called seen Dr Lorin Picket last week he was treating her for viral infection told her if she was not improving to call the office and he would call a medication into her pharmacy. Nose runny, sore throat and ear pain. Pressure across face now and head he said he would treat as a sinus infection and call something in.  [facility]

## 2021-12-29 NOTE — Telephone Encounter (Signed)
So at this point it is more than likely she started off with the virus but now it is coming on to the point where it is triggered a sinus infection I would recommend Augmentin 875 mg 1 twice daily for 7 days this should help with the discomfort and help improve the symptoms although residual congestion and coughing could take a few weeks to go away if ongoing troubles please follow-up

## 2021-12-31 ENCOUNTER — Other Ambulatory Visit: Payer: Self-pay | Admitting: Family Medicine

## 2021-12-31 ENCOUNTER — Telehealth: Payer: Self-pay

## 2021-12-31 MED ORDER — FLUCONAZOLE 150 MG PO TABS: 150.0000 mg | ORAL_TABLET | Freq: Once | ORAL | 1 refills | Status: AC

## 2021-12-31 NOTE — Telephone Encounter (Signed)
Caller name: KALYSTA KNEISLEY  On DPR?: Yes  Call back number: [phone] (mobile)  Provider they see: Babs Sciara, MD  Reason for call:Pt was put on antibiotic and now has yeast infection wants to see if something can be called in [place]

## 2022-01-01 NOTE — Telephone Encounter (Signed)
Cook, Jayce G, DO   ? ?Rx sent.   ? ?

## 2022-01-01 NOTE — Telephone Encounter (Signed)
Patient notified

## 2022-02-09 LAB — LIPID PANEL
Chol/HDL Ratio: 3.4 ratio (ref 0.0–4.4)
Cholesterol, Total: 161 mg/dL (ref 100–199)
HDL: 48 mg/dL (ref >39)
LDL Chol Calc (NIH): 74 mg/dL (ref 0–99)
Triglycerides: 239 mg/dL — ABNORMAL HIGH (ref 0–149)
VLDL Cholesterol Cal: 39 mg/dL (ref 5–40)

## 2022-02-09 LAB — HEPATIC FUNCTION PANEL
ALT: 9 IU/L (ref 0–32)
AST: 12 IU/L (ref 0–40)
Albumin: 4.5 g/dL (ref 3.8–4.9)
Alkaline Phosphatase: 61 IU/L (ref 44–121)
Bilirubin Total: <0.2 mg/dL (ref 0.0–1.2)
Bilirubin, Direct: <0.1 mg/dL (ref 0.00–0.40)
Total Protein: 6.5 g/dL (ref 6.0–8.5)

## 2022-02-12 ENCOUNTER — Other Ambulatory Visit: Payer: Self-pay | Admitting: Nurse Practitioner

## 2022-02-12 MED ORDER — ROSUVASTATIN CALCIUM 10 MG PO TABS: 10.0000 mg | ORAL_TABLET | Freq: Every day | ORAL | 3 refills | Status: DC

## 2022-02-12 NOTE — Progress Notes (Signed)
A review of the chart shows patient was on Rosuvastatin 10 mg qd. This was not on her medication list. Refill sent in.

## 2022-02-15 ENCOUNTER — Telehealth (INDEPENDENT_AMBULATORY_CARE_PROVIDER_SITE_OTHER): Payer: PRIVATE HEALTH INSURANCE | Admitting: Psychiatry

## 2022-02-15 ENCOUNTER — Encounter (HOSPITAL_COMMUNITY): Payer: Self-pay | Admitting: Psychiatry

## 2022-02-15 DIAGNOSIS — F3175 Bipolar disorder, in partial remission, most recent episode depressed: Secondary | ICD-10-CM | POA: Diagnosis not present

## 2022-02-15 MED ORDER — DIVALPROEX SODIUM ER 500 MG PO TB24: 1000.0000 mg | ORAL_TABLET | Freq: Every day | ORAL | 2 refills | Status: DC

## 2022-02-15 MED ORDER — ALPRAZOLAM 1 MG PO TABS: 1.0000 mg | ORAL_TABLET | Freq: Three times a day (TID) | ORAL | 2 refills | Status: DC | PRN

## 2022-02-15 MED ORDER — QUETIAPINE FUMARATE 200 MG PO TABS: 200.0000 mg | ORAL_TABLET | Freq: Every day | ORAL | 2 refills | Status: DC

## 2022-02-15 NOTE — Progress Notes (Signed)
Virtual Visit via Video Note  I connected with Anne Logan on 02/15/22 at  4:20 PM EST by a video enabled telemedicine application and verified that I am speaking with the correct person using two identifiers.  Location: Patient: car Provider: office   I discussed the limitations of evaluation and management by telemedicine and the availability of in person appointments. The patient expressed understanding and agreed to proceed.      I discussed the assessment and treatment plan with the patient. The patient was provided an opportunity to ask questions and all were answered. The patient agreed with the plan and demonstrated an understanding of the instructions.   The patient was advised to call back or seek an in-person evaluation if the symptoms worsen or if the condition fails to improve as anticipated.  I provided 15 minutes of non-face-to-face time during this encounter.   Levonne Spiller, MD  [facility] MD/PA/NP OP Progress Note  02/15/2022 4:25 PM Anne Logan  MRN:  [id number]  Chief Complaint:  Chief Complaint  Patient presents with   Depression   Anxiety   Follow-up   HPI: This patient is a 57 year old married white female lives with her husband in [place].  She works as an Counsellor for [employer].  She has one grown son  The patient returns for follow-up after about 5 months.  She states she is doing about the same.  Her husband is still dealing with a lot of chronic health issues and now he might need a stent put into his legs due to poor circulation.  She states that he is up a lot through the night it is hard for her to sleep.  She is doing the best she can.  She feels that her mood is stable and she denies significant depression anxiety thoughts of self-harm or suicidal ideation or manic symptoms  Visit Diagnosis:    ICD-10-CM   1. Bipolar disorder, in partial remission, most recent episode depressed ([place])  F31.75       Past  Psychiatric History: Hospitalization many years ago at that time she was abusing Xanax and Valium  Past Medical History:  Past Medical History:  Diagnosis Date   Anxiety    Depression    GERD (gastroesophageal reflux disease)     Past Surgical History:  Procedure Laterality Date   BALLOON DILATION N/A 10/28/2020   Procedure: BALLOON DILATION;  Surgeon: Eloise Harman, DO;  [facility];  Service: Endoscopy;  Laterality: N/A;   BIOPSY  01/23/2019   Procedure: BIOPSY;  Surgeon: Danie Binder, MD;  [facility];  Service: Endoscopy;;  duodenum gastric   BIOPSY  10/28/2020   Procedure: BIOPSY;  Surgeon: Eloise Harman, DO;  [facility];  Service: Endoscopy;;   COLONOSCOPY WITH PROPOFOL N/A 01/23/2019   Dr. Oneida Alar: Significant looping of the colon, internal hemorrhoids, moderate diverticulosis with restricted mobility of the rectosigmoid colon.  Recommended 5-year follow-up colonoscopy due to prep, polyps less than 3 mm could have been missed.   ESOPHAGOGASTRODUODENOSCOPY (EGD) WITH PROPOFOL N/A 01/23/2019   Dr. Oneida Alar: Benign-appearing esophageal stricture status post dilation.  Gastritis, no H. pylori.  Small bowel biopsies benign, no celiac.   ESOPHAGOGASTRODUODENOSCOPY (EGD) WITH PROPOFOL N/A 10/28/2020   Surgeon: Eloise Harman, DO;  mild Schatzki's ring s/p dilation, gastritis biopsied, normal examined duodenum.  Pathology with antral and oxyntic mucosa with slight chronic inflammation, negative for H. pylori.   SAVORY DILATION  N/A 01/23/2019   Procedure: SAVORY DILATION;  Surgeon: Danie Binder, MD;  [facility];  Service: Endoscopy;  Laterality: N/A;   TUBAL LIGATION Bilateral 2005    Family Psychiatric History: See below  Family History:  Family History  Problem Relation Age of Onset   Depression Mother    Cancer Mother        lymphoma   Alcohol abuse Father    Drug abuse Son    Colon cancer Neg Hx     Social  History:  Social History   Socioeconomic History   Marital status: Married    Spouse name: Not on file   Number of children: Not on file   Years of education: Not on file   Highest education level: Not on file  Occupational History   Not on file  Tobacco Use   Smoking status: Every Day    Packs/day: 0.50    Years: 20.00    Total pack years: 10.00    Types: Cigarettes   Smokeless tobacco: Never   Tobacco comments:    smokes 10 cig daily  Vaping Use   Vaping Use: Never used  Substance and Sexual Activity   Alcohol use: Not Currently    Alcohol/week: 0.0 standard drinks of alcohol    Comment: quit 2 years ago   Drug use: No   Sexual activity: Not Currently    Partners: Male    Birth control/protection: Surgical    Comment: tubal  Other Topics Concern   Not on file  Social History Narrative   Not on file   Social Determinants of Health   Financial Resource Strain: Not on file  Food Insecurity: Not on file  Transportation Needs: Not on file  Physical Activity: Not on file  Stress: Not on file  Social Connections: Not on file    Allergies:  Allergies  Allergen Reactions   Other Nausea Only    Per patient Pain medications, any kind   Protonix [Pantoprazole Sodium]     Patient reported sore tongue after 2 days of pantoprazole.  No swelling reported.    Metabolic Disorder Labs: Lab Results  Component Value Date   HGBA1C 5.9 (H) 11/08/2019   No results found for: "PROLACTIN" Lab Results  Component Value Date   CHOL 161 02/08/2022   TRIG 239 (H) 02/08/2022   HDL 48 02/08/2022   CHOLHDL 3.4 02/08/2022   VLDL 37 (H) 05/01/2015   LDLCALC 74 02/08/2022   LDLCALC 171 (H) 11/27/2021   Lab Results  Component Value Date   TSH 0.697 11/27/2021   TSH 0.58 06/13/2020    Therapeutic Level Labs: No results found for: "LITHIUM" Lab Results  Component Value Date   VALPROATE 69.0 01/31/2020   VALPROATE 88 02/08/2018   No results found for: "CBMZ"  Current  Medications: Current Outpatient Medications  Medication Sig Dispense Refill   ALPRAZolam (XANAX) 1 MG tablet Take 1 tablet (1 mg total) by mouth 3 (three) times daily as needed for anxiety. 90 tablet 2   amoxicillin-clavulanate (AUGMENTIN) 875-125 MG tablet Take 1 tablet by mouth 2 (two) times daily. 14 tablet 0   aspirin-acetaminophen-caffeine (EXCEDRIN MIGRAINE) 250-250-65 MG tablet Take 2 tablets by mouth every 8 (eight) hours as needed for headache.      dexlansoprazole (DEXILANT) 60 MG capsule Take 1 capsule (60 mg total) by mouth daily. 90 capsule 3   divalproex (DEPAKOTE ER) 500 MG 24 hr tablet Take 2 tablets (1,000 mg total) by mouth  daily. 60 tablet 2   Omega-3 Fatty Acids (FISH OIL PO) Take by mouth.     QUEtiapine (SEROQUEL) 200 MG tablet Take 1 tablet (200 mg total) by mouth at bedtime. 90 tablet 2   rosuvastatin (CRESTOR) 10 MG tablet Take 1 tablet (10 mg total) by mouth daily. 90 tablet 3   No current facility-administered medications for this visit.     Musculoskeletal: Strength & Muscle Tone: within normal limits Gait & Station: normal Patient leans: N/A  Psychiatric Specialty Exam: Review of Systems  All other systems reviewed and are negative.   Last menstrual period 04/13/2015.There is no height or weight on file to calculate BMI.  General Appearance: Casual, Neat, and Well Groomed  Eye Contact:  Good  Speech:  Clear and Coherent  Volume:  Normal  Mood:  Euthymic  Affect:  Congruent  Thought Process:  Goal Directed  Orientation:  Full (Time, Place, and Person)  Thought Content: Rumination   Suicidal Thoughts:  No  Homicidal Thoughts:  No  Memory:  Immediate;   Good Recent;   Good Remote;   Good  Judgement:  Good  Insight:  Good  Psychomotor Activity:  Normal  Concentration:  Concentration: Good and Attention Span: Good  Recall:  Good  Fund of Knowledge: Good  Language: Good  Akathisia:  No  Handed:  Right  AIMS (if indicated): not done  Assets:   Communication Skills Desire for Improvement Physical Health Resilience Social Support  ADL's:  Intact  Cognition: WNL  Sleep:  Fair   Screenings: GAD-7    [place] Office Visit from 11/27/2021 in [place] Office Visit from 11/20/2021 in [place]  Total GAD-7 Score 14 18      PHQ2-9    [place] Office Visit from 12/07/2021 in [place] Office Visit from 11/27/2021 in [place] Office Visit from 11/20/2021 in [place] Visit from 08/20/2021 in [place] at [place] Video Visit from 05/18/2021 in [place] at [facility] Total Score 0 4 1 1 1   PHQ-9 Total Score -- 18 9 22  --      [place] Office Visit from 08/20/2021 in [place] at [place] Video Visit from 05/18/2021 in [place] at [place] Video Visit from 02/18/2021 in [place] at [place] No Risk No Risk No Risk        Assessment and Plan: This patient is a 57 year old female with a history of bipolar disorder anxiety insomnia.  Overall she is doing fairly well on her current regimen.  She will continue Seroquel 200 mg at bedtime for mood stabilization and sleep, Xanax 1 mg 3 times daily for anxiety, Depakote ER 1000 mg at bedtime for mood stabilization.  She will return to see me in 3 months  Collaboration of Care: Collaboration of Care: Primary Care Provider AEB notes will be shared with PCP at patient's request  Patient/Guardian was advised Release of Information must be obtained prior to any record release in order to collaborate their care with an outside provider. Patient/Guardian was advised if they have not already done so to contact the registration department to sign all  necessary forms in order for [place] to release information regarding their care.   Consent: Patient/Guardian gives verbal consent for treatment and assignment of benefits for  services provided during this visit. Patient/Guardian expressed understanding and agreed to proceed.    [name], MD 02/15/2022, 4:25 PM

## 2022-03-06 ENCOUNTER — Ambulatory Visit (HOSPITAL_COMMUNITY): Payer: PRIVATE HEALTH INSURANCE | Admitting: Psychiatry

## 2022-03-18 ENCOUNTER — Other Ambulatory Visit: Payer: Self-pay | Admitting: *Deleted

## 2022-03-18 MED ORDER — ROSUVASTATIN CALCIUM 10 MG PO TABS: 10.0000 mg | ORAL_TABLET | Freq: Every day | ORAL | 1 refills | Status: DC

## 2022-05-10 ENCOUNTER — Telehealth (INDEPENDENT_AMBULATORY_CARE_PROVIDER_SITE_OTHER): Payer: PRIVATE HEALTH INSURANCE | Admitting: Psychiatry

## 2022-05-10 ENCOUNTER — Encounter (HOSPITAL_COMMUNITY): Payer: Self-pay | Admitting: Psychiatry

## 2022-05-10 DIAGNOSIS — F3175 Bipolar disorder, in partial remission, most recent episode depressed: Secondary | ICD-10-CM

## 2022-05-10 MED ORDER — ALPRAZOLAM 1 MG PO TABS: 1.0000 mg | ORAL_TABLET | Freq: Three times a day (TID) | ORAL | 2 refills | Status: DC | PRN

## 2022-05-10 MED ORDER — QUETIAPINE FUMARATE 200 MG PO TABS: 200.0000 mg | ORAL_TABLET | Freq: Every day | ORAL | 2 refills | Status: DC

## 2022-05-10 MED ORDER — DIVALPROEX SODIUM ER 500 MG PO TB24: 1000.0000 mg | ORAL_TABLET | Freq: Every day | ORAL | 2 refills | Status: DC

## 2022-05-10 NOTE — Progress Notes (Signed)
Virtual Visit via Video Note  I connected with Anne Logan on 05/10/22 at  3:40 PM EDT by a video enabled telemedicine application and verified that I am speaking with the correct person using two identifiers.  Location: Patient: home Provider: office   I discussed the limitations of evaluation and management by telemedicine and the availability of in person appointments. The patient expressed understanding and agreed to proceed.    I discussed the assessment and treatment plan with the patient. The patient was provided an opportunity to ask questions and all were answered. The patient agreed with the plan and demonstrated an understanding of the instructions.   The patient was advised to call back or seek an in-person evaluation if the symptoms worsen or if the condition fails to improve as anticipated.  I provided 15 minutes of non-face-to-face time during this encounter.   Anne Ruder, MD  [facility] MD/PA/NP OP Progress Note  05/10/2022 3:56 PM Anne Logan  MRN:  [id number]  Chief Complaint:  Chief Complaint  Patient presents with   Depression   Anxiety   Follow-up   HPI: This patient is a 57 year old married white female who lives with her husband in [place].  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  The patient returns for follow-up after 3 months.  She states that she is under a lot of stress.  Her husband has a lot of chronic health issues.  He had to have 2 vascular surgeries last month and she missed a lot of work.  Now he has gangrene in his foot and just had toes amputated earlier today.  She states he refuses to take pain medicines or antidepressants and is constantly crying or angry.  She is doing the best she can with him.  She claims she does not qualify for home health care.  She states that overall her mood is stable and she denies significant depression or thoughts of self-harm or suicide.  She is sleeping  whenever he is able to sleep. Visit Diagnosis:    ICD-10-CM   1. Bipolar disorder, in partial remission, most recent episode depressed  F31.75       Past Psychiatric History: Hospitalization many years ago.  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  Past Medical History:  Past Medical History:  Diagnosis Date   Anxiety    Depression    GERD (gastroesophageal reflux disease)     Past Surgical History:  Procedure Laterality Date   BALLOON DILATION N/A 10/28/2020   Procedure: BALLOON DILATION;  Surgeon: Lanelle Bal, DO;  [facility];  Service: Endoscopy;  Laterality: N/A;   BIOPSY  01/23/2019   Procedure: BIOPSY;  Surgeon: West Bali, MD;  [facility];  Service: Endoscopy;;  duodenum gastric   BIOPSY  10/28/2020   Procedure: BIOPSY;  Surgeon: Lanelle Bal, DO;  [facility];  Service: Endoscopy;;   COLONOSCOPY WITH PROPOFOL N/A 01/23/2019   Dr. Darrick Penna: Significant looping of the colon, internal hemorrhoids, moderate diverticulosis with restricted mobility of the rectosigmoid colon.  Recommended 5-year follow-up colonoscopy due to prep, polyps less than 3 mm could have been missed.   ESOPHAGOGASTRODUODENOSCOPY (EGD) WITH PROPOFOL N/A 01/23/2019   Dr. Darrick Penna: Benign-appearing esophageal stricture status post dilation.  Gastritis, no H. pylori.  Small bowel biopsies benign, no celiac.   ESOPHAGOGASTRODUODENOSCOPY (EGD) WITH PROPOFOL N/A 10/28/2020   Surgeon: Earnest Bailey  K, DO;  mild Schatzki's ring s/p dilation, gastritis biopsied, normal examined duodenum.  Pathology with antral and oxyntic mucosa with slight chronic inflammation, negative for H. pylori.   SAVORY DILATION N/A 01/23/2019   Procedure: SAVORY DILATION;  Surgeon: West Bali, MD;  [facility];  Service: Endoscopy;  Laterality: N/A;   TUBAL LIGATION Bilateral 2005    Family Psychiatric History: See below  Family History:  Family History  Problem  Relation Age of Onset   Depression Mother    Cancer Mother        lymphoma   Alcohol abuse Father    Drug abuse Son    Colon cancer Neg Hx     Social History:  Social History   Socioeconomic History   Marital status: Married    Spouse name: Not on file   Number of children: Not on file   Years of education: Not on file   Highest education level: Not on file  Occupational History   Not on file  Tobacco Use   Smoking status: Every Day    Packs/day: 0.50    Years: 20.00    Additional pack years: 0.00    Total pack years: 10.00    Types: Cigarettes   Smokeless tobacco: Never   Tobacco comments:    smokes 10 cig daily  Vaping Use   Vaping Use: Never used  Substance and Sexual Activity   Alcohol use: Not Currently    Alcohol/week: 0.0 standard drinks of alcohol    Comment: quit 2 years ago   Drug use: No   Sexual activity: Not Currently    Partners: Male    Birth control/protection: Surgical    Comment: tubal  Other Topics Concern   Not on file  Social History Narrative   Not on file   Social Determinants of Health   Financial Resource Strain: Not on file  Food Insecurity: Not on file  Transportation Needs: Not on file  Physical Activity: Not on file  Stress: Not on file  Social Connections: Not on file    Allergies:  Allergies  Allergen Reactions   Other Nausea Only    Per patient Pain medications, any kind   Protonix [Pantoprazole Sodium]     Patient reported sore tongue after 2 days of pantoprazole.  No swelling reported.    Metabolic Disorder Labs: Lab Results  Component Value Date   HGBA1C 5.9 (H) 11/08/2019   No results found for: "PROLACTIN" Lab Results  Component Value Date   CHOL 161 02/08/2022   TRIG 239 (H) 02/08/2022   HDL 48 02/08/2022   CHOLHDL 3.4 02/08/2022   VLDL 37 (H) 05/01/2015   LDLCALC 74 02/08/2022   LDLCALC 171 (H) 11/27/2021   Lab Results  Component Value Date   TSH 0.697 11/27/2021   TSH 0.58 06/13/2020     Therapeutic Level Labs: No results found for: "LITHIUM" Lab Results  Component Value Date   VALPROATE 69.0 01/31/2020   VALPROATE 88 02/08/2018   No results found for: "CBMZ"  Current Medications: Current Outpatient Medications  Medication Sig Dispense Refill   ALPRAZolam (XANAX) 1 MG tablet Take 1 tablet (1 mg total) by mouth 3 (three) times daily as needed for anxiety. 90 tablet 2   amoxicillin-clavulanate (AUGMENTIN) 875-125 MG tablet Take 1 tablet by mouth 2 (two) times daily. 14 tablet 0   aspirin-acetaminophen-caffeine (EXCEDRIN MIGRAINE) 250-250-65 MG tablet Take 2 tablets by mouth every 8 (eight) hours as needed for headache.  dexlansoprazole (DEXILANT) 60 MG capsule Take 1 capsule (60 mg total) by mouth daily. 90 capsule 3   divalproex (DEPAKOTE ER) 500 MG 24 hr tablet Take 2 tablets (1,000 mg total) by mouth daily. 60 tablet 2   Omega-3 Fatty Acids (FISH OIL PO) Take by mouth.     QUEtiapine (SEROQUEL) 200 MG tablet Take 1 tablet (200 mg total) by mouth at bedtime. 90 tablet 2   rosuvastatin (CRESTOR) 10 MG tablet Take 1 tablet (10 mg total) by mouth daily. 90 tablet 1   No current facility-administered medications for this visit.     Musculoskeletal: Strength & Muscle Tone: within normal limits Gait & Station: normal Patient leans: N/A  Psychiatric Specialty Exam: Review of Systems  Psychiatric/Behavioral:  The patient is nervous/anxious.   All other systems reviewed and are negative.   Last menstrual period 04/13/2015.There is no height or weight on file to calculate BMI.  General Appearance: Casual and Fairly Groomed  Eye Contact:  Good  Speech:  Clear and Coherent  Volume:  Normal  Mood:  Anxious  Affect:  Congruent  Thought Process:  Goal Directed  Orientation:  Full (Time, Place, and Person)  Thought Content: Rumination   Suicidal Thoughts:  No  Homicidal Thoughts:  No  Memory:  Immediate;   Good Recent;   Good Remote;   Good  Judgement:   Good  Insight:  Good  Psychomotor Activity:  Normal  Concentration:  Concentration: Fair and Attention Span: Fair  Recall:  Good  Fund of Knowledge: Good  Language: Good  Akathisia:  No  Handed:  Right  AIMS (if indicated): not done  Assets:  Communication Skills Desire for Improvement Physical Health Resilience Social Support Talents/Skills  ADL's:  Intact  Cognition: WNL  Sleep:  Fair   Screenings: GAD-7    Flowsheet Row Office Visit from 11/27/2021 in [facility] Office Visit from 11/20/2021 in [facility]  Total GAD-7 Score 14 18      PHQ2-9    Flowsheet Row Office Visit from 12/07/2021 in [facility] Office Visit from 11/27/2021 in [facility] Office Visit from 11/20/2021 in [facility] Office Visit from 08/20/2021 in [facility] [place] Video Visit from 05/18/2021 in [facility] Total Score 0 4 1 1 1   PHQ-9 Total Score -- 18 9 22  --      Flowsheet Row Office Visit from 08/20/2021 in [facility] at [place] Video Visit from 05/18/2021 in [facility] at [place] Video Visit from 02/18/2021 in [facility] at [place]  C-SSRS RISK CATEGORY No Risk No Risk No Risk        Assessment and Plan: This patient is a 57 year old female with a history of bipolar disorder anxiety and insomnia.  Despite all the stressors at home she seems to be doing well on her current regimen.  She will continue Seroquel 200 mg at bedtime for mood stabilization and sleep, Xanax 1 mg 3 times daily for anxiety and Depakote ER 1000 mg at bedtime for mood stabilization.  She will return to see me in 3 months  Collaboration of Care: Collaboration of Care: Primary Care Provider AEB notes will be shared with  PCP at patient's request  Patient/Guardian was advised Release of Information must be obtained prior to any record release in order to  collaborate their care with an outside provider. Patient/Guardian was advised if they have not already done so to contact the registration department to sign all necessary forms in order for [place] to release information regarding their care.   Consent: Patient/Guardian gives verbal consent for treatment and assignment of benefits for services provided during this visit. Patient/Guardian expressed understanding and agreed to proceed.    Anne Ruder, MD 05/10/2022, 3:56 PM

## 2022-07-29 ENCOUNTER — Telehealth: Payer: Self-pay | Admitting: Internal Medicine

## 2022-07-29 NOTE — Telephone Encounter (Signed)
Tried to submit PA and it is stating that a PA is not required. [employer] and they are trying to get it corrected on their end. Pt was made aware and verbalized understanding.

## 2022-07-29 NOTE — Telephone Encounter (Signed)
Patient contacted me again saying her insurance has changed and either she or her husband would come by today for [place] to scan and update her insurance information.

## 2022-07-29 NOTE — Telephone Encounter (Signed)
Patient called and left a message yesterday and again today about needing a prescription refill and a prior auth. She is out of medication and needs one for tonight. She uses [employer] [facility] , but didn't state what the medication name was. Please return her call at [phone] Im not sure who is covering for C. Mordecai Maes while she is out. So, Im forwarding to both Tammy C and Dena B.

## 2022-07-30 NOTE — Telephone Encounter (Signed)
noted 

## 2022-08-02 ENCOUNTER — Telehealth (HOSPITAL_COMMUNITY): Payer: PRIVATE HEALTH INSURANCE | Admitting: Psychiatry

## 2022-08-02 ENCOUNTER — Encounter (HOSPITAL_COMMUNITY): Payer: Self-pay | Admitting: Psychiatry

## 2022-08-02 DIAGNOSIS — F3175 Bipolar disorder, in partial remission, most recent episode depressed: Secondary | ICD-10-CM

## 2022-08-02 MED ORDER — QUETIAPINE FUMARATE 200 MG PO TABS: 200.0000 mg | ORAL_TABLET | Freq: Every day | ORAL | 2 refills | Status: DC

## 2022-08-02 MED ORDER — ALPRAZOLAM 1 MG PO TABS: 1.0000 mg | ORAL_TABLET | Freq: Three times a day (TID) | ORAL | 2 refills | Status: DC | PRN

## 2022-08-02 MED ORDER — DIVALPROEX SODIUM ER 500 MG PO TB24: 1000.0000 mg | ORAL_TABLET | Freq: Every day | ORAL | 2 refills | Status: DC

## 2022-08-02 NOTE — Progress Notes (Signed)
Virtual Visit via Video Note  I connected with Anne Logan on 08/02/22 at  4:00 PM EDT by a video enabled telemedicine application and verified that I am speaking with the correct person using two identifiers.  Location: Patient: home Provider: office   I discussed the limitations of evaluation and management by telemedicine and the availability of in person appointments. The patient expressed understanding and agreed to proceed.     I discussed the assessment and treatment plan with the patient. The patient was provided an opportunity to ask questions and all were answered. The patient agreed with the plan and demonstrated an understanding of the instructions.   The patient was advised to call back or seek an in-person evaluation if the symptoms worsen or if the condition fails to improve as anticipated.  I provided 15 minutes of non-face-to-face time during this encounter.   [name], MD  [facility] MD/PA/NP OP Progress Note  08/02/2022 4:21 PM Anne Logan  MRN:  [id number]  Chief Complaint:  Chief Complaint  Patient presents with   Anxiety   Depression   Follow-up   HPI: This patient is a 57 year old married white female who lives with her husband in [place].  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  The patient returns for follow-up after 3 months.  Her husband's health is still not good.  He is on dialysis him and it is that he has a clot in the graft.  She states he is always angry and upset and often yells at her.  He refuses to take pain medication or antidepressants.  Nevertheless she seems to be holding her own.  She does the best she can with him.  She states that her mood is stable and she denies significant depression and the Xanax continues to help her anxiety.  She is able to focus well on her job.  She sleeps fairly well. Visit Diagnosis:    ICD-10-CM   1. Bipolar disorder, in partial remission, most  recent episode depressed (HCC)  F31.75       Past Psychiatric History:  Hospitalization many years ago.  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   Past Medical History:  Past Medical History:  Diagnosis Date   Anxiety    Depression    GERD (gastroesophageal reflux disease)     Past Surgical History:  Procedure Laterality Date   BALLOON DILATION N/A 10/28/2020   Procedure: BALLOON DILATION;  Surgeon: Lanelle Bal, DO;  [facility];  Service: Endoscopy;  Laterality: N/A;   BIOPSY  01/23/2019   Procedure: BIOPSY;  Surgeon: West Bali, MD;  [facility];  Service: Endoscopy;;  duodenum gastric   BIOPSY  10/28/2020   Procedure: BIOPSY;  Surgeon: Lanelle Bal, DO;  [facility];  Service: Endoscopy;;   COLONOSCOPY WITH PROPOFOL N/A 01/23/2019   Dr. Darrick Penna: Significant looping of the colon, internal hemorrhoids, moderate diverticulosis with restricted mobility of the rectosigmoid colon.  Recommended 5-year follow-up colonoscopy due to prep, polyps less than 3 mm could have been missed.   ESOPHAGOGASTRODUODENOSCOPY (EGD) WITH PROPOFOL N/A 01/23/2019   Dr. Darrick Penna: Benign-appearing esophageal stricture status post dilation.  Gastritis, no H. pylori.  Small bowel biopsies benign, no celiac.   ESOPHAGOGASTRODUODENOSCOPY (EGD) WITH PROPOFOL N/A 10/28/2020   Surgeon: Lanelle Bal, DO;  mild Schatzki's ring s/p dilation, gastritis biopsied, normal examined duodenum.  Pathology  with antral and oxyntic mucosa with slight chronic inflammation, negative for H. pylori.   SAVORY DILATION N/A 01/23/2019   Procedure: SAVORY DILATION;  Surgeon: West Bali, MD;  [facility];  Service: Endoscopy;  Laterality: N/A;   TUBAL LIGATION Bilateral 2005    Family Psychiatric History: See below  Family History:  Family History  Problem Relation Age of Onset   Depression Mother    Cancer Mother        lymphoma   Alcohol abuse Father     Drug abuse Son    Colon cancer Neg Hx     Social History:  Social History   Socioeconomic History   Marital status: Married    Spouse name: Not on file   Number of children: Not on file   Years of education: Not on file   Highest education level: Not on file  Occupational History   Not on file  Tobacco Use   Smoking status: Every Day    Current packs/day: 0.50    Average packs/day: 0.5 packs/day for 20.0 years (10.0 ttl pk-yrs)    Types: Cigarettes   Smokeless tobacco: Never   Tobacco comments:    smokes 10 cig daily  Vaping Use   Vaping status: Never Used  Substance and Sexual Activity   Alcohol use: Not Currently    Alcohol/week: 0.0 standard drinks of alcohol    Comment: quit 2 years ago   Drug use: No   Sexual activity: Not Currently    Partners: Male    Birth control/protection: Surgical    Comment: tubal  Other Topics Concern   Not on file  Social History Narrative   Not on file   Social Determinants of Health   Financial Resource Strain: Not on file  Food Insecurity: Not on file  Transportation Needs: Not on file  Physical Activity: Not on file  Stress: Not on file  Social Connections: Not on file    Allergies:  Allergies  Allergen Reactions   Other Nausea Only    Per patient Pain medications, any kind   Protonix [Pantoprazole Sodium]     Patient reported sore tongue after 2 days of pantoprazole.  No swelling reported.    Metabolic Disorder Labs: Lab Results  Component Value Date   HGBA1C 5.9 (H) 11/08/2019   No results found for: "PROLACTIN" Lab Results  Component Value Date   CHOL 161 02/08/2022   TRIG 239 (H) 02/08/2022   HDL 48 02/08/2022   CHOLHDL 3.4 02/08/2022   VLDL 37 (H) 05/01/2015   LDLCALC 74 02/08/2022   LDLCALC 171 (H) 11/27/2021   Lab Results  Component Value Date   TSH 0.697 11/27/2021   TSH 0.58 06/13/2020    Therapeutic Level Labs: No results found for: "LITHIUM" Lab Results  Component Value Date    VALPROATE 69.0 01/31/2020   VALPROATE 88 02/08/2018   No results found for: "CBMZ"  Current Medications: Current Outpatient Medications  Medication Sig Dispense Refill   ALPRAZolam (XANAX) 1 MG tablet Take 1 tablet (1 mg total) by mouth 3 (three) times daily as needed for anxiety. 90 tablet 2   amoxicillin-clavulanate (AUGMENTIN) 875-125 MG tablet Take 1 tablet by mouth 2 (two) times daily. 14 tablet 0   aspirin-acetaminophen-caffeine (EXCEDRIN MIGRAINE) 250-250-65 MG tablet Take 2 tablets by mouth every 8 (eight) hours as needed for headache.      dexlansoprazole (DEXILANT) 60 MG capsule Take 1 capsule (60 mg total) by mouth daily. 90 capsule  3   divalproex (DEPAKOTE ER) 500 MG 24 hr tablet Take 2 tablets (1,000 mg total) by mouth daily. 60 tablet 2   Omega-3 Fatty Acids (FISH OIL PO) Take by mouth.     QUEtiapine (SEROQUEL) 200 MG tablet Take 1 tablet (200 mg total) by mouth at bedtime. 90 tablet 2   rosuvastatin (CRESTOR) 10 MG tablet Take 1 tablet (10 mg total) by mouth daily. 90 tablet 1   No current facility-administered medications for this visit.     Musculoskeletal: Strength & Muscle Tone: within normal limits Gait & Station: normal Patient leans: N/A  Psychiatric Specialty Exam: Review of Systems  All other systems reviewed and are negative.   Last menstrual period 04/13/2015.There is no height or weight on file to calculate BMI.  General Appearance: Casual and Fairly Groomed  Eye Contact:  Good  Speech:  Clear and Coherent  Volume:  Normal  Mood:  Anxious and Euthymic  Affect:  Congruent  Thought Process:  Goal Directed  Orientation:  Full (Time, Place, and Person)  Thought Content: WDL   Suicidal Thoughts:  No  Homicidal Thoughts:  No  Memory:  Immediate;   Good Recent;   Good Remote;   Good  Judgement:  Good  Insight:  Good  Psychomotor Activity:  Normal  Concentration:  Concentration: Good and Attention Span: Good  Recall:  Good  Fund of Knowledge: Good   Language: Good  Akathisia:  No  Handed:  Right  AIMS (if indicated): not done  Assets:  Communication Skills Desire for Improvement Physical Health Resilience Social Support Talents/Skills  ADL's:  Intact  Cognition: WNL  Sleep:  Fair   Screenings: GAD-7    Flowsheet Row Office Visit from 11/27/2021 in [facility] Office Visit from 11/20/2021 in [facility]  Total GAD-7 Score 14 18      PHQ2-9    Flowsheet Row Office Visit from 12/07/2021 in [facility] Office Visit from 11/27/2021 in [facility] Office Visit from 11/20/2021 in [facility] Office Visit from 08/20/2021 in [facility] [place] Video Visit from 05/18/2021 in [facility] Total Score 0 4 1 1 1   PHQ-9 Total Score -- 18 9 22  --      Flowsheet Row Office Visit from 08/20/2021 in [facility] at [place] Video Visit from 05/18/2021 in [facility] at [place] Video Visit from 02/18/2021 in [facility] at [place]  C-SSRS RISK CATEGORY No Risk No Risk No Risk        Assessment and Plan:  This patient is a 57 year old female with a history of bipolar disorder anxiety and insomnia.  Despite the stressors at home she continues to do well on her current regimen.  She will continue Seroquel 200 mg at bedtime for mood stabilization and sleep, Xanax 1 mg 3 times daily for anxiety and Depakote ER 1000 mg at bedtime for mood stabilization.  She will return to see me in 3 months Collaboration of Care: Collaboration of Care: Primary Care Provider AEB notes to be shared with PCP at patient's request  Patient/Guardian was advised Release of Information must be obtained prior to any record release in order to collaborate their care with  an outside provider. Patient/Guardian was advised if they have not already done so to contact the registration department to sign  all necessary forms in order for [place] to release information regarding their care.   Consent: Patient/Guardian gives verbal consent for treatment and assignment of benefits for services provided during this visit. Patient/Guardian expressed understanding and agreed to proceed.    [name], MD 08/02/2022, 4:21 PM

## 2022-08-27 ENCOUNTER — Other Ambulatory Visit: Payer: Self-pay | Admitting: Family Medicine

## 2022-09-16 ENCOUNTER — Ambulatory Visit (INDEPENDENT_AMBULATORY_CARE_PROVIDER_SITE_OTHER): Payer: PRIVATE HEALTH INSURANCE | Admitting: Nurse Practitioner

## 2022-09-16 ENCOUNTER — Encounter: Payer: Self-pay | Admitting: Nurse Practitioner

## 2022-09-16 VITALS — BP 122/82 | HR 101 | Temp 97.2°F | Wt 140.6 lb

## 2022-09-16 DIAGNOSIS — M25561 Pain in right knee: Secondary | ICD-10-CM

## 2022-09-16 DIAGNOSIS — R2689 Other abnormalities of gait and mobility: Secondary | ICD-10-CM | POA: Diagnosis not present

## 2022-09-16 DIAGNOSIS — R7301 Impaired fasting glucose: Secondary | ICD-10-CM | POA: Diagnosis not present

## 2022-09-16 DIAGNOSIS — Z79899 Other long term (current) drug therapy: Secondary | ICD-10-CM

## 2022-09-16 DIAGNOSIS — G8929 Other chronic pain: Secondary | ICD-10-CM

## 2022-09-16 NOTE — Patient Instructions (Addendum)
  [facility] [place] [phone] [place], [place] [phone]

## 2022-09-16 NOTE — Progress Notes (Signed)
   Subjective:    Patient ID: Anne Logan, female    DOB: [birth year], 57 y.o.   MRN: [id number]  HPI Patient coming in for balance issues. Patient states she is not dizzy and ears don't hurt. Patient states she fell about a month ago and balance has gotten worse ever since. Patient did not go to UC.  Has had some right thigh and knee pain since falling about a month ago.  Denies any ear pressure or pain.  No ringing in the ears.  No dizziness.  Occasionally will feel like she falls laterally to the side mostly the right side but occasionally to the left.  States she never falls backwards or forwards.  This occurs several times a week.  Continues to have constant headaches, no change.  Remains under significant stress.  Seen by psychiatry for mental health issues.  Denies any suicidal or homicidal thoughts or ideation.  Has had an eye exam.  No loss of vision or other significant visual changes.  No numbness or weakness of the face arms or legs.  No difficulty speaking or swallowing.  No change in her medications.  Review of Systems  Respiratory:  Negative for cough, chest tightness and shortness of breath.   Cardiovascular:  Negative for chest pain.  Neurological:  Positive for headaches. Negative for dizziness, syncope, facial asymmetry, speech difficulty, weakness, light-headedness and numbness.       Objective:   Physical Exam NAD. Alert, oriented. No facial asymmetry. Speech clear. Gets on and off table with some difficulty but no assistance. Lungs clear.  Heart regular rate rhythm.  Reflexes normal limit lower extremities.  Gait slow but steady.  Tenderness noted in the anterior lower thigh just above the knee.  Can perform full passive ROM of the knee with some tenderness.  No obvious edema. Today's Vitals   09/16/22 1005  BP: 122/82  Pulse: (!) 101  Temp: (!) 97.2 F (36.2 C)  SpO2: 96%  Weight: 140 lb 9.6 oz (63.8 kg)   Body mass index is 25.72 kg/m. 11/27/2021 fasting glucose  117.       Assessment & Plan:   Problem List Items Addressed This Visit       Other   Chronic pain of right knee - Primary   Elevated fasting glucose   Relevant Orders   Hemoglobin A1c (Completed)   Other Visit Diagnoses     Loss of balance       High risk medication use       Relevant Orders   Hemoglobin A1c (Completed)      Hemoglobin A1c pending. Recommend that patient have follow-up at urgent care clinic at one of the local orthopedic offices.  Given information.  Feel that her loss of balance is related to a strain in the right knee/thigh area.  Based on her history and physical exam, there is no evidence of any type of neurologic issue. Return if symptoms worsen or fail to improve.

## 2022-09-17 ENCOUNTER — Encounter: Payer: Self-pay | Admitting: Nurse Practitioner

## 2022-09-17 DIAGNOSIS — R7301 Impaired fasting glucose: Secondary | ICD-10-CM | POA: Insufficient documentation

## 2022-09-17 DIAGNOSIS — G8929 Other chronic pain: Secondary | ICD-10-CM | POA: Insufficient documentation

## 2022-09-17 LAB — HEMOGLOBIN A1C
Est. average glucose Bld gHb Est-mCnc: 140 mg/dL
Hgb A1c MFr Bld: 6.5 % — ABNORMAL HIGH (ref 4.8–5.6)

## 2022-09-23 ENCOUNTER — Other Ambulatory Visit: Payer: Self-pay | Admitting: Nurse Practitioner

## 2022-09-23 MED ORDER — UMECLIDINIUM-VILANTEROL 62.5-25 MCG/ACT IN AEPB: INHALATION_SPRAY | RESPIRATORY_TRACT | 0 refills | Status: DC

## 2022-10-14 ENCOUNTER — Other Ambulatory Visit (HOSPITAL_COMMUNITY): Payer: Self-pay | Admitting: Nurse Practitioner

## 2022-10-14 DIAGNOSIS — Z1231 Encounter for screening mammogram for malignant neoplasm of breast: Secondary | ICD-10-CM

## 2022-10-25 ENCOUNTER — Ambulatory Visit (HOSPITAL_COMMUNITY): Payer: PRIVATE HEALTH INSURANCE

## 2022-10-27 ENCOUNTER — Other Ambulatory Visit (HOSPITAL_COMMUNITY): Payer: Self-pay | Admitting: Psychiatry

## 2022-10-27 ENCOUNTER — Encounter (HOSPITAL_COMMUNITY): Payer: Self-pay | Admitting: Psychiatry

## 2022-10-27 ENCOUNTER — Telehealth (INDEPENDENT_AMBULATORY_CARE_PROVIDER_SITE_OTHER): Payer: PRIVATE HEALTH INSURANCE | Admitting: Psychiatry

## 2022-10-27 DIAGNOSIS — F3175 Bipolar disorder, in partial remission, most recent episode depressed: Secondary | ICD-10-CM

## 2022-10-27 MED ORDER — DIVALPROEX SODIUM ER 500 MG PO TB24: 1000.0000 mg | ORAL_TABLET | Freq: Every day | ORAL | 2 refills | Status: DC

## 2022-10-27 MED ORDER — QUETIAPINE FUMARATE 200 MG PO TABS: 200.0000 mg | ORAL_TABLET | Freq: Every day | ORAL | 2 refills | Status: DC

## 2022-10-27 MED ORDER — ALPRAZOLAM 1 MG PO TABS: 1.0000 mg | ORAL_TABLET | Freq: Three times a day (TID) | ORAL | 2 refills | Status: DC | PRN

## 2022-10-27 NOTE — Progress Notes (Signed)
Virtual Visit via Video Note  I connected with Anne Logan on 10/27/22 at  4:20 PM EDT by a video enabled telemedicine application and verified that I am speaking with the correct person using two identifiers.  Location: Patient: home Provider: office   I discussed the limitations of evaluation and management by telemedicine and the availability of in person appointments. The patient expressed understanding and agreed to proceed.      I discussed the assessment and treatment plan with the patient. The patient was provided an opportunity to ask questions and all were answered. The patient agreed with the plan and demonstrated an understanding of the instructions.   The patient was advised to call back or seek an in-person evaluation if the symptoms worsen or if the condition fails to improve as anticipated.  I provided 15 minutes of non-face-to-face time during this encounter.   [name], MD  [facility] MD/PA/NP OP Progress Note  10/27/2022 4:22 PM Anne Logan  MRN:  [id number]  Chief Complaint:  Chief Complaint  Patient presents with   Depression   Anxiety   Follow-up   HPI: This patient is a 57 year old married white female lives with her husband in [place].  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  The patient returns for follow-up after 3 months regarding her bipolar disorder.  She is still under a lot of stress.  Her husband has had a long history of renal failure pancreatitis and he has been diagnosed with COPD.  He was recently hospitalized and left AGAINST MEDICAL ADVICE.  She states that he is often angry upset and cursing.  He was offered antidepressant medicine and refused to take it.  He was also offered home health care and refused to  The patient is trying to do the best she can.  She is trying to find a new job as she wants to work outside the home she states that her mood is stable and she denies  significant depression mood swings or irritability and the Xanax continues to help her anxiety she sleeps when she can but states her husband often keeps her up at night Visit Diagnosis:    ICD-10-CM   1. Bipolar disorder, in partial remission, most recent episode depressed (HCC)  F31.75       Past Psychiatric History: Hospitalization many years ago.  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  Past Medical History:  Past Medical History:  Diagnosis Date   Anxiety    Depression    GERD (gastroesophageal reflux disease)     Past Surgical History:  Procedure Laterality Date   BALLOON DILATION N/A 10/28/2020   Procedure: BALLOON DILATION;  Surgeon: Lanelle Bal, DO;  [facility];  Service: Endoscopy;  Laterality: N/A;   BIOPSY  01/23/2019   Procedure: BIOPSY;  Surgeon: West Bali, MD;  [facility];  Service: Endoscopy;;  duodenum gastric   BIOPSY  10/28/2020   Procedure: BIOPSY;  Surgeon: Lanelle Bal, DO;  [facility];  Service: Endoscopy;;   COLONOSCOPY WITH PROPOFOL N/A 01/23/2019   Dr. Darrick Penna: Significant looping of the colon, internal hemorrhoids, moderate diverticulosis with restricted mobility of the rectosigmoid colon.  Recommended 5-year follow-up colonoscopy due to prep, polyps less than 3 mm could have been missed.   ESOPHAGOGASTRODUODENOSCOPY (EGD) WITH PROPOFOL N/A 01/23/2019   Dr. Darrick Penna: Benign-appearing esophageal stricture status post dilation.  Gastritis,  no H. pylori.  Small bowel biopsies benign, no celiac.   ESOPHAGOGASTRODUODENOSCOPY (EGD) WITH PROPOFOL N/A 10/28/2020   Surgeon: Lanelle Bal, DO;  mild Schatzki's ring s/p dilation, gastritis biopsied, normal examined duodenum.  Pathology with antral and oxyntic mucosa with slight chronic inflammation, negative for H. pylori.   SAVORY DILATION N/A 01/23/2019   Procedure: SAVORY DILATION;  Surgeon: West Bali, MD;  [facility];  Service:  Endoscopy;  Laterality: N/A;   TUBAL LIGATION Bilateral 2005    Family Psychiatric History: See below  Family History:  Family History  Problem Relation Age of Onset   Depression Mother    Cancer Mother        lymphoma   Alcohol abuse Father    Drug abuse Son    Colon cancer Neg Hx     Social History:  Social History   Socioeconomic History   Marital status: Married    Spouse name: Not on file   Number of children: Not on file   Years of education: Not on file   Highest education level: Not on file  Occupational History   Not on file  Tobacco Use   Smoking status: Every Day    Current packs/day: 0.50    Average packs/day: 0.5 packs/day for 20.0 years (10.0 ttl pk-yrs)    Types: Cigarettes   Smokeless tobacco: Never   Tobacco comments:    smokes 10 cig daily  Vaping Use   Vaping status: Never Used  Substance and Sexual Activity   Alcohol use: Not Currently    Alcohol/week: 0.0 standard drinks of alcohol    Comment: quit 2 years ago   Drug use: No   Sexual activity: Not Currently    Partners: Male    Birth control/protection: Surgical    Comment: tubal  Other Topics Concern   Not on file  Social History Narrative   Not on file   Social Determinants of Health   Financial Resource Strain: Not on file  Food Insecurity: Not on file  Transportation Needs: Not on file  Physical Activity: Not on file  Stress: Not on file  Social Connections: Not on file    Allergies:  Allergies  Allergen Reactions   Other Nausea Only    Per patient Pain medications, any kind   Protonix [Pantoprazole Sodium]     Patient reported sore tongue after 2 days of pantoprazole.  No swelling reported.    Metabolic Disorder Labs: Lab Results  Component Value Date   HGBA1C 6.5 (H) 09/16/2022   No results found for: "PROLACTIN" Lab Results  Component Value Date   CHOL 161 02/08/2022   TRIG 239 (H) 02/08/2022   HDL 48 02/08/2022   CHOLHDL 3.4 02/08/2022   VLDL 37 (H)  05/01/2015   LDLCALC 74 02/08/2022   LDLCALC 171 (H) 11/27/2021   Lab Results  Component Value Date   TSH 0.697 11/27/2021   TSH 0.58 06/13/2020    Therapeutic Level Labs: No results found for: "LITHIUM" Lab Results  Component Value Date   VALPROATE 69.0 01/31/2020   VALPROATE 88 02/08/2018   No results found for: "CBMZ"  Current Medications: Current Outpatient Medications  Medication Sig Dispense Refill   umeclidinium-vilanterol (ANORO ELLIPTA) 62.5-25 MCG/ACT AEPB Inhale one puff each morning for breathing and wheezing 1 each 0   ALPRAZolam (XANAX) 1 MG tablet Take 1 tablet (1 mg total) by mouth 3 (three) times daily as needed for anxiety. 90 tablet 2   aspirin-acetaminophen-caffeine (  EXCEDRIN MIGRAINE) 250-250-65 MG tablet Take 2 tablets by mouth every 8 (eight) hours as needed for headache.      dexlansoprazole (DEXILANT) 60 MG capsule Take 1 capsule (60 mg total) by mouth daily. 90 capsule 3   divalproex (DEPAKOTE ER) 500 MG 24 hr tablet Take 2 tablets (1,000 mg total) by mouth daily. 60 tablet 2   Omega-3 Fatty Acids (FISH OIL PO) Take by mouth.     QUEtiapine (SEROQUEL) 200 MG tablet Take 1 tablet (200 mg total) by mouth at bedtime. 90 tablet 2   rosuvastatin (CRESTOR) 10 MG tablet Take 1 tablet (10 mg total) by mouth daily. 90 tablet 0   No current facility-administered medications for this visit.     Musculoskeletal: Strength & Muscle Tone: within normal limits Gait & Station: normal Patient leans: N/A  Psychiatric Specialty Exam: Review of Systems  All other systems reviewed and are negative.   Last menstrual period 04/13/2015.There is no height or weight on file to calculate BMI.  General Appearance: Casual and Fairly Groomed  Eye Contact:  Good  Speech:  Clear and Coherent  Volume:  Normal  Mood:  Euthymic  Affect:  Congruent  Thought Process:  Goal Directed  Orientation:  Full (Time, Place, and Person)  Thought Content: Rumination   Suicidal  Thoughts:  No  Homicidal Thoughts:  No  Memory:  Immediate;   Good Recent;   Good Remote;   Fair  Judgement:  Good  Insight:  Good  Psychomotor Activity:  Normal  Concentration:  Concentration: Good and Attention Span: Good  Recall:  Good  Fund of Knowledge: Good  Language: Good  Akathisia:  No  Handed:  Right  AIMS (if indicated): not done  Assets:  Communication Skills Desire for Improvement Physical Health Resilience Social Support  ADL's:  Intact  Cognition: WNL  Sleep:  Fair   Screenings: GAD-7    Flowsheet Row Office Visit from 09/16/2022 in [facility] [place] [facility] Office Visit from 11/27/2021 in [facility] Office Visit from 11/20/2021 in [facility] [place]  Total GAD-7 Score 21 14 18       PHQ2-9    Flowsheet Row Office Visit from 09/16/2022 in [facility] Office Visit from 12/07/2021 in [facility] Office Visit from 11/27/2021 in [facility] Office Visit from 11/20/2021 in [facility] [facility] Office Visit from 08/20/2021 in [facility] Total Score 6 0 4 1 1   PHQ-9 Total Score 22 -- 18 9 22       Flowsheet Row Office Visit from 08/20/2021 in [facility] [place] Video Visit from 05/18/2021 in [facility] [place] Video Visit from 02/18/2021 in [facility] [place]  C-SSRS RISK CATEGORY No Risk No Risk No Risk        Assessment and Plan: This patient is a 57 year old female with a history of bipolar disorder anxiety and insomnia.  Despite the stressors with her husband she continues to be stable.  She will continue Seroquel 200 mg at bedtime for mood stabilization and sleep, Xanax 1 mg 3 times daily for anxiety and Depakote ER 1000 mg at bedtime for mood stabilization.   Laboratories were reviewed and hepatic function was normal.  Next visit we will look at getting a Depakote level.  She will return to see me in 3 months  Collaboration of Care: Collaboration of Care: Primary Care Provider AEB notes are shared with PCP on the epic system  Patient/Guardian was advised Release of Information must be obtained prior to any record release in order to collaborate their care with an outside provider. Patient/Guardian was advised if they have not already done so to contact the registration department to sign all necessary forms in order for [place] to release information regarding their care.   Consent: Patient/Guardian gives verbal consent for treatment and assignment of benefits for services provided during this visit. Patient/Guardian expressed understanding and agreed to proceed.    [name], MD 10/27/2022, 4:22 PM

## 2022-11-22 ENCOUNTER — Ambulatory Visit (HOSPITAL_COMMUNITY)
Admission: RE | Admit: 2022-11-22 | Discharge: 2022-11-22 | Disposition: A | Discharge status: Home / Self Care | Payer: PRIVATE HEALTH INSURANCE | Source: Ambulatory Visit | Attending: Nurse Practitioner | Admitting: Nurse Practitioner

## 2022-11-22 DIAGNOSIS — Z1231 Encounter for screening mammogram for malignant neoplasm of breast: Secondary | ICD-10-CM | POA: Diagnosis present

## 2022-11-29 ENCOUNTER — Other Ambulatory Visit: Payer: Self-pay | Admitting: Gastroenterology

## 2022-11-29 DIAGNOSIS — K219 Gastro-esophageal reflux disease without esophagitis: Secondary | ICD-10-CM

## 2022-12-06 ENCOUNTER — Ambulatory Visit: Payer: Self-pay | Admitting: Family Medicine

## 2022-12-06 ENCOUNTER — Other Ambulatory Visit: Payer: Self-pay | Admitting: Family Medicine

## 2022-12-06 NOTE — Telephone Encounter (Signed)
Patient stated he work laptop has messed up and she is in [place] waiting for It to work on it and is now unable to come today and will keep the appointment in the morning.

## 2022-12-06 NOTE — Telephone Encounter (Signed)
  Chief Complaint: blood in urine Symptoms: pain, pressure, blood in urine  Frequency: ongoing Pertinent Negatives: Patient denies chest or SOB Disposition: [] ED /[] Urgent Care (no appt availability in office) / [x] Appointment(In office/virtual)/ []  [facility]/ [] Home Care/ [] Refused Recommended Disposition /[] [facility]/ []  Follow-up with PCP Additional Notes: pt c/o pain and pressure with urination starting on Saturday, then Sunday pt noticed blood in urine. Pt took OTC medicine for UTI & drank cranberry juice and yogurt with no relief.  No appt available today: scheduled pt appt today in office: Care advice given.  Pt would like sooner appt if possible Reason for Disposition  Pain or burning with passing urine  Answer Assessment - Initial Assessment Questions 1. COLOR of URINE: "Describe the color of the urine."  (e.g., tea-colored, pink, red, bloody) "Do you have blood clots in your urine?" (e.g., none, pea, grape, small coin)     Orange  2. ONSET: "When did the bleeding start?"      Saturday 3. EPISODES: "How many times has there been blood in the urine?" or "How many times today?"     Blood started in urine on Sunday but pressure started on Saturday 4. PAIN with URINATION: "Is there any pain with passing your urine?" If Yes, ask: "How bad is the pain?"  (Scale 1-10; or mild, moderate, severe)    - MILD: Complains slightly about urination hurting.    - MODERATE: Interferes with normal activities.      - SEVERE: Excruciating, unwilling or unable to urinate because of the pain.     Pain, pressure, burning and blood with urination 5. FEVER: "Do you have a fever?" If Yes, ask: "What is your temperature, how was it measured, and when did it start?"     no 6. ASSOCIATED SYMPTOMS: "Are you passing urine more frequently than usual?"     Increased frequency 7. OTHER SYMPTOMS: "Do you have any other symptoms?" (e.g., back/flank pain, abdomen pain, vomiting)     no 8.  PREGNANCY: "Is there any chance you are pregnant?" "When was your last menstrual period?"     no  Protocols used: Urine - Blood In-A-AH

## 2022-12-06 NOTE — Telephone Encounter (Signed)
Patient was given appt with you tomorrow for dysuria and blood in urine but wants to be worked in today- No available appointments today for either provider- please advise

## 2022-12-06 NOTE — Telephone Encounter (Signed)
[name], DO     Put her on for 430 PM

## 2022-12-07 ENCOUNTER — Ambulatory Visit (INDEPENDENT_AMBULATORY_CARE_PROVIDER_SITE_OTHER): Payer: PRIVATE HEALTH INSURANCE | Admitting: Family Medicine

## 2022-12-07 ENCOUNTER — Encounter: Payer: Self-pay | Admitting: Family Medicine

## 2022-12-07 VITALS — BP 111/76 | HR 98 | Temp 97.0°F | Ht 62.0 in | Wt 132.0 lb

## 2022-12-07 DIAGNOSIS — N3001 Acute cystitis with hematuria: Secondary | ICD-10-CM

## 2022-12-07 LAB — POCT URINALYSIS DIP (CLINITEK)
Bilirubin, UA: NEGATIVE
Glucose, UA: NEGATIVE mg/dL
Ketones, POC UA: NEGATIVE mg/dL
Nitrite, UA: POSITIVE — AB
POC PROTEIN,UA: 100 — AB
Spec Grav, UA: >=1.03 — AB (ref 1.010–1.025)
Urobilinogen, UA: 0.2 U/dL
pH, UA: 6 (ref 5.0–8.0)

## 2022-12-07 MED ORDER — CEPHALEXIN 500 MG PO CAPS: 500.0000 mg | ORAL_CAPSULE | Freq: Two times a day (BID) | ORAL | 0 refills | Status: DC

## 2022-12-07 NOTE — Assessment & Plan Note (Signed)
Symptoms and UA is consistent with UTI.  Sending culture.  Placing on Keflex.

## 2022-12-07 NOTE — Progress Notes (Signed)
Subjective:  Patient ID: Anne Logan, female    DOB: 1965/06/10  Age: 57 y.o. MRN: [id number]  CC:   Chief Complaint  Patient presents with   Dysuria    HPI:  57 year old female presents with urinary symptoms.  Started on Saturday.  She reports dysuria, urgency, and frequency.  She is also had hematuria.  No fever.  Mild low back pain.  No flank pain.  No abdominal pain.  No relieving factors.  No other associated symptoms.  No other complaints.  Patient Active Problem List   Diagnosis Date Noted   Chronic pain of right knee 09/17/2022   Elevated fasting glucose 09/17/2022   Acute cystitis with hematuria 12/07/2021   Hyperlipidemia 12/04/2021   Dense breasts 11/20/2021   Chronic nonintractable headache 06/12/2020   Gastroesophageal reflux disease    Tobacco use 11/17/2018   Mixed stress and urge urinary incontinence 01/31/2017   Bipolar disorder, unspecified (HCC) 09/11/2012    Social Hx   Social History   Socioeconomic History   Marital status: Married    Spouse name: Not on file   Number of children: Not on file   Years of education: Not on file   Highest education level: Not on file  Occupational History   Not on file  Tobacco Use   Smoking status: Every Day    Current packs/day: 0.50    Average packs/day: 0.5 packs/day for 20.0 years (10.0 ttl pk-yrs)    Types: Cigarettes   Smokeless tobacco: Never   Tobacco comments:    smokes 10 cig daily  Vaping Use   Vaping status: Never Used  Substance and Sexual Activity   Alcohol use: Not Currently    Alcohol/week: 0.0 standard drinks of alcohol    Comment: quit 2 years ago   Drug use: No   Sexual activity: Not Currently    Partners: Male    Birth control/protection: Surgical    Comment: tubal  Other Topics Concern   Not on file  Social History Narrative   Not on file   Social Determinants of Health   Financial Resource Strain: Not on file  Food Insecurity: Not on file  Transportation Needs: Not on  file  Physical Activity: Not on file  Stress: Not on file  Social Connections: Not on file    Review of Systems Per HPI  Objective:  BP 111/76   Pulse 98   Temp (!) 97 F (36.1 C)   Ht 5\' 2"  (1.575 m)   Wt 132 lb (59.9 kg)   LMP 04/13/2015   SpO2 96%   BMI 24.14 kg/m      12/07/2022   11:01 AM 09/16/2022   10:05 AM 12/22/2021    3:39 PM  BP/Weight  Systolic BP 111 122 130  Diastolic BP 76 82 80  Wt. (Lbs) 132 140.6 134  BMI 24.14 kg/m2 25.72 kg/m2 24.51 kg/m2    Physical Exam Vitals and nursing note reviewed.  Constitutional:      General: She is not in acute distress.    Appearance: Normal appearance.  HENT:     Head: Normocephalic and atraumatic.  Eyes:     General:        Right eye: No discharge.        Left eye: No discharge.     Conjunctiva/sclera: Conjunctivae normal.  Cardiovascular:     Rate and Rhythm: Normal rate and regular rhythm.  Pulmonary:     Effort: Pulmonary effort is normal.  Breath sounds: Normal breath sounds.  Abdominal:     General: There is no distension.     Palpations: Abdomen is soft.     Tenderness: There is no abdominal tenderness. There is no right CVA tenderness or left CVA tenderness.  Neurological:     Mental Status: She is alert.     Lab Results  Component Value Date   WBC 4.8 11/27/2021   HGB 13.6 11/27/2021   HCT 41.1 11/27/2021   PLT 271 11/27/2021   GLUCOSE 117 (H) 11/27/2021   CHOL 161 02/08/2022   TRIG 239 (H) 02/08/2022   HDL 48 02/08/2022   LDLCALC 74 02/08/2022   ALT 9 02/08/2022   AST 12 02/08/2022   NA 142 11/27/2021   K 4.3 11/27/2021   CL 104 11/27/2021   CREATININE 0.60 11/27/2021   BUN 16 11/27/2021   CO2 25 11/27/2021   TSH 0.697 11/27/2021   HGBA1C 6.5 (H) 09/16/2022     Assessment & Plan:   Problem List Items Addressed This Visit       Genitourinary   Acute cystitis with hematuria - Primary    Symptoms and UA is consistent with UTI.  Sending culture.  Placing on Keflex.       Relevant Medications   cephALEXin (KEFLEX) 500 MG capsule   Other Relevant Orders   POCT URINALYSIS DIP (CLINITEK) (Completed)   Urine Culture    Meds ordered this encounter  Medications   cephALEXin (KEFLEX) 500 MG capsule    Sig: Take 1 capsule (500 mg total) by mouth 2 (two) times daily.    Dispense:  14 capsule    Refill:  0    Follow-up:  Return if symptoms worsen or fail to improve.  Everlene Other DO [facility]

## 2022-12-10 LAB — URINE CULTURE

## 2022-12-10 LAB — SPECIMEN STATUS REPORT

## 2023-01-18 ENCOUNTER — Other Ambulatory Visit (HOSPITAL_COMMUNITY): Payer: Self-pay | Admitting: Psychiatry

## 2023-01-25 ENCOUNTER — Other Ambulatory Visit (HOSPITAL_COMMUNITY): Payer: Self-pay | Admitting: Psychiatry

## 2023-01-27 ENCOUNTER — Encounter (HOSPITAL_COMMUNITY): Payer: Self-pay | Admitting: Psychiatry

## 2023-01-27 ENCOUNTER — Telehealth (INDEPENDENT_AMBULATORY_CARE_PROVIDER_SITE_OTHER): Payer: Self-pay | Admitting: Psychiatry

## 2023-01-27 DIAGNOSIS — F3175 Bipolar disorder, in partial remission, most recent episode depressed: Secondary | ICD-10-CM

## 2023-01-27 MED ORDER — QUETIAPINE FUMARATE 100 MG PO TABS: 100.0000 mg | ORAL_TABLET | Freq: Every day | ORAL | 2 refills | Status: DC

## 2023-01-27 MED ORDER — ZOLPIDEM TARTRATE ER 12.5 MG PO TBCR: 12.5000 mg | EXTENDED_RELEASE_TABLET | Freq: Every evening | ORAL | 2 refills | Status: DC | PRN

## 2023-01-27 MED ORDER — DIVALPROEX SODIUM ER 500 MG PO TB24: 1000.0000 mg | ORAL_TABLET | Freq: Every day | ORAL | 2 refills | Status: DC

## 2023-01-27 MED ORDER — ALPRAZOLAM 1 MG PO TABS: 1.0000 mg | ORAL_TABLET | Freq: Three times a day (TID) | ORAL | 2 refills | Status: DC | PRN

## 2023-01-27 NOTE — Progress Notes (Signed)
 Virtual Visit via Video Note  I connected with Anne Logan on 01/27/23 at  4:20 PM EST by a video enabled telemedicine application and verified that I am speaking with the correct person using two identifiers.  Location: Patient: home Provider: office   I discussed the limitations of evaluation and management by telemedicine and the availability of in person appointments. The patient expressed understanding and agreed to proceed.     I discussed the assessment and treatment plan with the patient. The patient was provided an opportunity to ask questions and all were answered. The patient agreed with the plan and demonstrated an understanding of the instructions.   The patient was advised to call back or seek an in-person evaluation if the symptoms worsen or if the condition fails to improve as anticipated.  I provided 20 minutes of non-face-to-face time during this encounter.   Barnie Gull, MD  [facility] MD/PA/NP OP Progress Note  01/27/2023 4:34 PM Anne Logan  MRN:  [id number]  Chief Complaint:  Chief Complaint  Patient presents with   Depression   Anxiety   Follow-up   HPI: This patient is a 58 year old married white female lives with her husband in [place].  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   The she returns for follow-up after 3 months regarding her history of bipolar disorder depression and anxiety.  She states she is still dealing with her husband's chronic illnesses including renal failure pancreatitis and COPD.  He is sleeping better because he was put on medication but she still cannot sleep at night and is up and down all night.  She is often eating a lot at night and is gained 25 pounds in the last year.  Unfortunately I think this is probably due to the Seroquel  that she takes at bedtime.  I suggested that we cut down the Seroquel  and try Ambien  CR instead and she is in agreement.  She does seem very  tired and states she is making more mistakes at work due to lack of sleep.  Her mood is fairly stable and she denies any thoughts of self-harm. Visit Diagnosis:    ICD-10-CM   1. Bipolar disorder, in partial remission, most recent episode depressed (HCC)  F31.75       Past Psychiatric History: Hospitalization many years ago.  At that time she was abusing Xanax  and Valium   Past Medical History:  Past Medical History:  Diagnosis Date   Anxiety    Depression    GERD (gastroesophageal reflux disease)     Past Surgical History:  Procedure Laterality Date   BALLOON DILATION N/A 10/28/2020   Procedure: BALLOON DILATION;  Surgeon: Cindie Carlin POUR, DO;  [facility];  Service: Endoscopy;  Laterality: N/A;   BIOPSY  01/23/2019   Procedure: BIOPSY;  Surgeon: Harvey Margo CROME, MD;  [facility];  Service: Endoscopy;;  duodenum gastric   BIOPSY  10/28/2020   Procedure: BIOPSY;  Surgeon: Cindie Carlin POUR, DO;  [facility];  Service: Endoscopy;;   COLONOSCOPY WITH PROPOFOL  N/A 01/23/2019   Dr. Harvey: Significant looping of the colon, internal hemorrhoids, moderate diverticulosis with restricted mobility of the rectosigmoid colon.  Recommended 5-year follow-up colonoscopy due to prep, polyps less than 3 mm could have been missed.   ESOPHAGOGASTRODUODENOSCOPY (EGD) WITH PROPOFOL  N/A 01/23/2019   Dr. Harvey: Benign-appearing esophageal stricture status post dilation.  Gastritis, no H. pylori.  Small bowel biopsies benign, no celiac.   ESOPHAGOGASTRODUODENOSCOPY (EGD) WITH PROPOFOL  N/A 10/28/2020   Surgeon: Cindie Carlin POUR, DO;  mild Schatzki's ring s/p dilation, gastritis biopsied, normal examined duodenum.  Pathology with antral and oxyntic mucosa with slight chronic inflammation, negative for H. pylori.   SAVORY DILATION N/A 01/23/2019   Procedure: SAVORY DILATION;  Surgeon: Harvey Margo CROME, MD;  [facility];  Service: Endoscopy;  Laterality: N/A;    TUBAL LIGATION Bilateral 2005    Family Psychiatric History: See below  Family History:  Family History  Problem Relation Age of Onset   Depression Mother    Cancer Mother        lymphoma   Alcohol abuse Father    Drug abuse Son    Colon cancer Neg Hx     Social History:  Social History   Socioeconomic History   Marital status: Married    Spouse name: Not on file   Number of children: Not on file   Years of education: Not on file   Highest education level: Not on file  Occupational History   Not on file  Tobacco Use   Smoking status: Every Day    Current packs/day: 0.50    Average packs/day: 0.5 packs/day for 20.0 years (10.0 ttl pk-yrs)    Types: Cigarettes   Smokeless tobacco: Never   Tobacco comments:    smokes 10 cig daily  Vaping Use   Vaping status: Never Used  Substance and Sexual Activity   Alcohol use: Not Currently    Alcohol/week: 0.0 standard drinks of alcohol    Comment: quit 2 years ago   Drug use: No   Sexual activity: Not Currently    Partners: Male    Birth control/protection: Surgical    Comment: tubal  Other Topics Concern   Not on file  Social History Narrative   Not on file   Social Drivers of Health   Financial Resource Strain: Not on file  Food Insecurity: Not on file  Transportation Needs: Not on file  Physical Activity: Not on file  Stress: Not on file  Social Connections: Not on file    Allergies:  Allergies  Allergen Reactions   Other Nausea Only    Per patient Pain medications, any kind   Protonix  [Pantoprazole  Sodium]     Patient reported sore tongue after 2 days of pantoprazole .  No swelling reported.    Metabolic Disorder Labs: Lab Results  Component Value Date   HGBA1C 6.5 (H) 09/16/2022   No results found for: PROLACTIN Lab Results  Component Value Date   CHOL 161 02/08/2022   TRIG 239 (H) 02/08/2022   HDL 48 02/08/2022   CHOLHDL 3.4 02/08/2022   VLDL 37 (H) 05/01/2015   LDLCALC 74 02/08/2022    LDLCALC 171 (H) 11/27/2021   Lab Results  Component Value Date   TSH 0.697 11/27/2021   TSH 0.58 06/13/2020    Therapeutic Level Labs: No results found for: LITHIUM Lab Results  Component Value Date   VALPROATE 69.0 01/31/2020   VALPROATE 88 02/08/2018   No results found for: CBMZ  Current Medications: Current Outpatient Medications  Medication Sig Dispense Refill   QUEtiapine  (SEROQUEL ) 100 MG tablet Take 1 tablet (100 mg total) by mouth at bedtime. 30 tablet 2   rosuvastatin  (CRESTOR ) 10 MG tablet Take 1 tablet (10 mg total) by mouth daily. 90 tablet 0   umeclidinium-vilanterol (ANORO ELLIPTA ) 62.5-25 MCG/ACT AEPB Inhale one puff each morning for  breathing and wheezing 1 each 0   zolpidem  (AMBIEN  CR) 12.5 MG CR tablet Take 1 tablet (12.5 mg total) by mouth at bedtime as needed for sleep. 30 tablet 2   ALPRAZolam  (XANAX ) 1 MG tablet Take 1 tablet (1 mg total) by mouth 3 (three) times daily as needed for anxiety. 90 tablet 2   aspirin-acetaminophen -caffeine (EXCEDRIN MIGRAINE) 250-250-65 MG tablet Take 2 tablets by mouth every 8 (eight) hours as needed for headache.      cephALEXin  (KEFLEX ) 500 MG capsule Take 1 capsule (500 mg total) by mouth 2 (two) times daily. 14 capsule 0   dexlansoprazole  (DEXILANT ) 60 MG capsule Take 1 capsule (60 mg total) by mouth daily. 30 capsule 3   divalproex  (DEPAKOTE  ER) 500 MG 24 hr tablet Take 2 tablets (1,000 mg total) by mouth daily. 60 tablet 2   Omega-3 Fatty Acids (FISH OIL PO) Take by mouth.     No current facility-administered medications for this visit.     Musculoskeletal: Strength & Muscle Tone: within normal limits Gait & Station: normal Patient leans: N/A  Psychiatric Specialty Exam: Review of Systems  Psychiatric/Behavioral:  Positive for sleep disturbance.   All other systems reviewed and are negative.   Last menstrual period 04/13/2015.There is no height or weight on file to calculate BMI.  General Appearance: Casual  and Fairly Groomed  Eye Contact:  Good  Speech:  Clear and Coherent  Volume:  Normal  Mood:  Dysphoric  Affect:  Flat  Thought Process:  Goal Directed  Orientation:  Full (Time, Place, and Person)  Thought Content: Rumination   Suicidal Thoughts:  No  Homicidal Thoughts:  No  Memory:  Immediate;   Good Recent;   Good Remote;   Fair  Judgement:  Good  Insight:  Good  Psychomotor Activity:  Decreased  Concentration:  Concentration: Fair and Attention Span: Fair  Recall:  Good  Fund of Knowledge: Good  Language: Good  Akathisia:  No  Handed:  Right  AIMS (if indicated): not done  Assets:  Communication Skills Desire for Improvement Physical Health Resilience Social Support  ADL's:  Intact  Cognition: WNL  Sleep:  Poor   Screenings: GAD-7    Flowsheet Row Office Visit from 09/16/2022 in [facility] [place] [facility] Office Visit from 11/27/2021 in [facility] [place] [facility] Office Visit from 11/20/2021 in [facility] [place] [facility]  Total GAD-7 Score 21 14 18       PHQ2-9    Flowsheet Row Office Visit from 09/16/2022 in [facility] [place] [facility] Office Visit from 12/07/2021 in [facility] [place] [facility] Office Visit from 11/27/2021 in [facility] [place] [facility] Office Visit from 11/20/2021 in [facility] [place] [facility] Office Visit from 08/20/2021 in [facility] Total Score 6 0 4 1 1   PHQ-9 Total Score 22 -- 18 9 22       Flowsheet Row Office Visit from 08/20/2021 in [facility] [place] Video Visit from 05/18/2021 in [facility] at [place] Video Visit from 02/18/2021 in [facility] at [place]  C-SSRS RISK CATEGORY No Risk No Risk No Risk        Assessment and Plan: This patient is a 58 year old female with a has history of bipolar disorder anxiety and  insomnia.  She is not sleeping well with the Seroquel  and it seems to be causing appetite increase so we will cut  it back from 200 to 100 mg at bedtime and add Ambien  CR 12.5 mg to help with sleep.  For now she will continue Xanax  1 mg 3 times daily for anxiety and Depakote  ER 1000 mg at bedtime for mood stabilization.  She asked about getting off Depakote  but we will need to do all of this gradually.  She will return to see me in 2 months  Collaboration of Care: Collaboration of Care: Primary Care Provider AEB notes will be shared with PCP at patient's request  Patient/Guardian was advised Release of Information must be obtained prior to any record release in order to collaborate their care with an outside provider. Patient/Guardian was advised if they have not already done so to contact the registration department to sign all necessary forms in order for us  to release information regarding their care.   Consent: Patient/Guardian gives verbal consent for treatment and assignment of benefits for services provided during this visit. Patient/Guardian expressed understanding and agreed to proceed.    Barnie Gull, MD 01/27/2023, 4:34 PM

## 2023-03-11 ENCOUNTER — Other Ambulatory Visit: Payer: Self-pay | Admitting: Family Medicine

## 2023-03-23 ENCOUNTER — Telehealth (HOSPITAL_COMMUNITY): Payer: Self-pay | Admitting: Psychiatry

## 2023-03-23 ENCOUNTER — Encounter (HOSPITAL_COMMUNITY): Payer: Self-pay | Admitting: Psychiatry

## 2023-03-23 DIAGNOSIS — F3175 Bipolar disorder, in partial remission, most recent episode depressed: Secondary | ICD-10-CM

## 2023-03-23 MED ORDER — QUETIAPINE FUMARATE 100 MG PO TABS: 100.0000 mg | ORAL_TABLET | Freq: Every day | ORAL | 2 refills | Status: DC

## 2023-03-23 MED ORDER — ALPRAZOLAM 1 MG PO TABS: 1.0000 mg | ORAL_TABLET | Freq: Three times a day (TID) | ORAL | 2 refills | Status: DC | PRN

## 2023-03-23 MED ORDER — ZOLPIDEM TARTRATE ER 12.5 MG PO TBCR: 12.5000 mg | EXTENDED_RELEASE_TABLET | Freq: Every evening | ORAL | 2 refills | Status: DC | PRN

## 2023-03-23 MED ORDER — DIVALPROEX SODIUM ER 500 MG PO TB24: 1000.0000 mg | ORAL_TABLET | Freq: Every day | ORAL | 2 refills | Status: DC

## 2023-03-23 NOTE — Progress Notes (Signed)
 Virtual Visit via Video Note  I connected with Anne Logan on 03/23/23 at  4:20 PM EST by a video enabled telemedicine application and verified that I am speaking with the correct person using two identifiers.  Location: Patient: home Provider: office   I discussed the limitations of evaluation and management by telemedicine and the availability of in person appointments. The patient expressed understanding and agreed to proceed.      I discussed the assessment and treatment plan with the patient. The patient was provided an opportunity to ask questions and all were answered. The patient agreed with the plan and demonstrated an understanding of the instructions.   The patient was advised to call back or seek an in-person evaluation if the symptoms worsen or if the condition fails to improve as anticipated.  I provided 20 minutes of non-face-to-face time during this encounter.   Anne Ruder, MD  [facility] MD/PA/NP OP Progress Note  03/23/2023 4:24 PM Anne Logan  MRN:  [id number]  Chief Complaint:  Chief Complaint  Patient presents with   Anxiety   Depression   Follow-up   HPI: This patient is a 58 year old married white female lives with her husband in [place]. 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   The patient returns for follow-up after 3 months regarding her history of bipolar disorder depression and anxiety.  She had already been dealing with her husband's chronic illness including renal failure pancreatitis COPD.  Now however her mother just suffered a stroke and has lost speech as well as some motor function.  She and her siblings are helping to care for their father.  She is kind of going back and forth between her husband and her mother plus trying to do her job.  She states that she is barely handling all this but somehow is holding it together.  We did cut down on the Seroquel at bedtime last time because she  was gaining weight.  She is now on 100 mg and states that she is eating much less.  She is not sure if the Ambien CR is really helping her sleep that much because there is "so much going on in my life right now."  She would like to stay on it a little bit longer.  She is obviously very stressed but denies serious depression or thoughts of self-harm or suicide.  The Xanax continues to help with her anxiety Visit Diagnosis:    ICD-10-CM   1. Bipolar disorder, in partial remission, most recent episode depressed (HCC)  F31.75       Past Psychiatric History: Hospitalization many years ago.  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  Past Medical History:  Past Medical History:  Diagnosis Date   Anxiety    Depression    GERD (gastroesophageal reflux disease)     Past Surgical History:  Procedure Laterality Date   BALLOON DILATION N/A 10/28/2020   Procedure: BALLOON DILATION;  Surgeon: Lanelle Bal, DO;  [facility];  Service: Endoscopy;  Laterality: N/A;   BIOPSY  01/23/2019   Procedure: BIOPSY;  Surgeon: [place] Bali, MD;  [facility];  Service: Endoscopy;;  duodenum gastric   BIOPSY  10/28/2020   Procedure: BIOPSY;  Surgeon: Lanelle Bal, DO;  [facility];  Service: Endoscopy;;   COLONOSCOPY WITH PROPOFOL N/A 01/23/2019   Dr. Darrick Penna: Significant looping of the colon, internal  hemorrhoids, moderate diverticulosis with restricted mobility of the rectosigmoid colon.  Recommended 5-year follow-up colonoscopy due to prep, polyps less than 3 mm could have been missed.   ESOPHAGOGASTRODUODENOSCOPY (EGD) WITH PROPOFOL N/A 01/23/2019   Dr. Darrick Penna: Benign-appearing esophageal stricture status post dilation.  Gastritis, no H. pylori.  Small bowel biopsies benign, no celiac.   ESOPHAGOGASTRODUODENOSCOPY (EGD) WITH PROPOFOL N/A 10/28/2020   Surgeon: Lanelle Bal, DO;  mild Schatzki's ring s/p dilation, gastritis biopsied, normal examined  duodenum.  Pathology with antral and oxyntic mucosa with slight chronic inflammation, negative for H. pylori.   SAVORY DILATION N/A 01/23/2019   Procedure: SAVORY DILATION;  Surgeon: [place] Bali, MD;  [facility];  Service: Endoscopy;  Laterality: N/A;   TUBAL LIGATION Bilateral 2005    Family Psychiatric History: See below  Family History:  Family History  Problem Relation Age of Onset   Depression Mother    Cancer Mother        lymphoma   Alcohol abuse Father    Drug abuse Son    Colon cancer Neg Hx     Social History:  Social History   Socioeconomic History   Marital status: Married    Spouse name: Not on file   Number of children: Not on file   Years of education: Not on file   Highest education level: Not on file  Occupational History   Not on file  Tobacco Use   Smoking status: Every Day    Current packs/day: 0.50    Average packs/day: 0.5 packs/day for 20.0 years (10.0 ttl pk-yrs)    Types: Cigarettes   Smokeless tobacco: Never   Tobacco comments:    smokes 10 cig daily  Vaping Use   Vaping status: Never Used  Substance and Sexual Activity   Alcohol use: Not Currently    Alcohol/week: 0.0 standard drinks of alcohol    Comment: quit 2 years ago   Drug use: No   Sexual activity: Not Currently    Partners: Male    Birth control/protection: Surgical    Comment: tubal  Other Topics Concern   Not on file  Social History Narrative   Not on file   Social Drivers of Health   Financial Resource Strain: Not on file  Food Insecurity: Not on file  Transportation Needs: Not on file  Physical Activity: Not on file  Stress: Not on file  Social Connections: Not on file    Allergies:  Allergies  Allergen Reactions   Other Nausea Only    Per patient Pain medications, any kind   Protonix [Pantoprazole Sodium]     Patient reported sore tongue after 2 days of pantoprazole.  No swelling reported.    Metabolic Disorder Labs: Lab Results   Component Value Date   HGBA1C 6.5 (H) 09/16/2022   No results found for: "PROLACTIN" Lab Results  Component Value Date   CHOL 161 02/08/2022   TRIG 239 (H) 02/08/2022   HDL 48 02/08/2022   CHOLHDL 3.4 02/08/2022   VLDL 37 (H) 05/01/2015   LDLCALC 74 02/08/2022   LDLCALC 171 (H) 11/27/2021   Lab Results  Component Value Date   TSH 0.697 11/27/2021   TSH 0.58 06/13/2020    Therapeutic Level Labs: No results found for: "LITHIUM" Lab Results  Component Value Date   VALPROATE 69.0 01/31/2020   VALPROATE 88 02/08/2018   No results found for: "CBMZ"  Current Medications: Current Outpatient Medications  Medication Sig Dispense Refill  umeclidinium-vilanterol (ANORO ELLIPTA) 62.5-25 MCG/ACT AEPB Inhale one puff each morning for breathing and wheezing 1 each 0   ALPRAZolam (XANAX) 1 MG tablet Take 1 tablet (1 mg total) by mouth 3 (three) times daily as needed for anxiety. 90 tablet 2   aspirin-acetaminophen-caffeine (EXCEDRIN MIGRAINE) 250-250-65 MG tablet Take 2 tablets by mouth every 8 (eight) hours as needed for headache.      cephALEXin (KEFLEX) 500 MG capsule Take 1 capsule (500 mg total) by mouth 2 (two) times daily. 14 capsule 0   dexlansoprazole (DEXILANT) 60 MG capsule Take 1 capsule (60 mg total) by mouth daily. 30 capsule 3   divalproex (DEPAKOTE ER) 500 MG 24 hr tablet Take 2 tablets (1,000 mg total) by mouth daily. 60 tablet 2   Omega-3 Fatty Acids (FISH OIL PO) Take by mouth.     QUEtiapine (SEROQUEL) 100 MG tablet Take 1 tablet (100 mg total) by mouth at bedtime. 30 tablet 2   rosuvastatin (CRESTOR) 10 MG tablet Take 1 tablet (10 mg total) by mouth daily. 90 tablet 0   zolpidem (AMBIEN CR) 12.5 MG CR tablet Take 1 tablet (12.5 mg total) by mouth at bedtime as needed for sleep. 30 tablet 2   No current facility-administered medications for this visit.     Musculoskeletal: Strength & Muscle Tone: within normal limits Gait & Station: normal Patient leans:  N/A  Psychiatric Specialty Exam: Review of Systems  Constitutional:  Positive for fatigue.  All other systems reviewed and are negative.   Last menstrual period 04/13/2015.There is no height or weight on file to calculate BMI.  General Appearance: Casual and Fairly Groomed  Eye Contact:  Good  Speech:  Clear and Coherent  Volume:  Normal  Mood:  Anxious  Affect:  Congruent  Thought Process:  Goal Directed  Orientation:  Full (Time, Place, and Person)  Thought Content: WDL   Suicidal Thoughts:  No  Homicidal Thoughts:  No  Memory:  Immediate;   Good Recent;   Good Remote;   Fair  Judgement:  Good  Insight:  Good  Psychomotor Activity:  Normal  Concentration:  Concentration: Good and Attention Span: Good  Recall:  Good  Fund of Knowledge: Good  Language: Good  Akathisia:  No  Handed:  Right  AIMS (if indicated): not done  Assets:  Communication Skills Desire for Improvement Physical Health Resilience Social Support  ADL's:  Intact  Cognition: WNL  Sleep:  Fair   Screenings: GAD-7    Flowsheet Row Office Visit from 09/16/2022 in [facility] [place] [facility] Office Visit from 11/27/2021 in [facility] [place] [facility] Office Visit from 11/20/2021 in [facility] [place] [facility]  Total GAD-7 Score 21 14 18       PHQ2-9    Flowsheet Row Office Visit from 09/16/2022 in [facility] [place] [facility] Office Visit from 12/07/2021 in [facility] Office Visit from 11/27/2021 in [facility] [place] [facility] Office Visit from 11/20/2021 in [facility] Office Visit from 08/20/2021 in [facility] Total Score 6 0 4 1 1   PHQ-9 Total Score 22 -- 18 9 22       Flowsheet Row Office Visit from 08/20/2021 in [place] [facility] [place] Video Visit from 05/18/2021 in [facility] at  [place] Video Visit from 02/18/2021 in [facility] at [place]  C-SSRS RISK CATEGORY No Risk No Risk  No Risk        Assessment and Plan: This patient is a 58 year old female with a history of bipolar disorder anxiety and insomnia for now she thinks she is doing okay with her current regimen but she is obviously very stressed by caring for tubes critically ill adults.  For now she will continue Seroquel 100 mg at bedtime for mood stabilization, Ambien CR 12.5 mg at bedtime for sleep, Xanax 1 mg 3 times daily for anxiety and Depakote ER 1000 mg at bedtime for mood stabilization.  She will return to see me in 3 months  Collaboration of Care: Collaboration of Care: Primary Care Provider AEB notes will be shared with PCP at patient's request  Patient/Guardian was advised Release of Information must be obtained prior to any record release in order to collaborate their care with an outside provider. Patient/Guardian was advised if they have not already done so to contact the registration department to sign all necessary forms in order for [place] to release information regarding their care.   Consent: Patient/Guardian gives verbal consent for treatment and assignment of benefits for services provided during this visit. Patient/Guardian expressed understanding and agreed to proceed.    Anne Ruder, MD 03/23/2023, 4:24 PM

## 2023-03-27 ENCOUNTER — Other Ambulatory Visit: Payer: Self-pay | Admitting: Gastroenterology

## 2023-03-27 DIAGNOSIS — K219 Gastro-esophageal reflux disease without esophagitis: Secondary | ICD-10-CM

## 2023-04-14 ENCOUNTER — Other Ambulatory Visit (HOSPITAL_COMMUNITY): Payer: Self-pay | Admitting: Psychiatry

## 2023-04-26 ENCOUNTER — Other Ambulatory Visit (HOSPITAL_COMMUNITY): Payer: Self-pay | Admitting: Psychiatry

## 2023-06-13 ENCOUNTER — Other Ambulatory Visit (HOSPITAL_COMMUNITY): Payer: Self-pay | Admitting: Psychiatry

## 2023-06-23 ENCOUNTER — Telehealth (INDEPENDENT_AMBULATORY_CARE_PROVIDER_SITE_OTHER): Payer: Self-pay | Admitting: Psychiatry

## 2023-06-23 ENCOUNTER — Encounter (HOSPITAL_COMMUNITY): Payer: Self-pay | Admitting: Psychiatry

## 2023-06-23 DIAGNOSIS — F3175 Bipolar disorder, in partial remission, most recent episode depressed: Secondary | ICD-10-CM

## 2023-06-23 MED ORDER — DIVALPROEX SODIUM ER 500 MG PO TB24: 1000.0000 mg | ORAL_TABLET | Freq: Every day | ORAL | 2 refills | Status: DC

## 2023-06-23 MED ORDER — ALPRAZOLAM 1 MG PO TABS: 1.0000 mg | ORAL_TABLET | Freq: Three times a day (TID) | ORAL | 2 refills | Status: DC | PRN

## 2023-06-23 MED ORDER — QUETIAPINE FUMARATE 50 MG PO TABS
50.0000 mg | ORAL_TABLET | Freq: Two times a day (BID) | ORAL | 2 refills | Status: DC
Start: 2023-06-23 — End: 2023-09-27

## 2023-06-23 NOTE — Progress Notes (Signed)
 Virtual Visit via Video Note  I connected with Anne Logan on 06/23/23 at  4:20 PM EDT by a video enabled telemedicine application and verified that I am speaking with the correct person using two identifiers.  Location: Patient: home Provider: office   I discussed the limitations of evaluation and management by telemedicine and the availability of in person appointments. The patient expressed understanding and agreed to proceed.      I discussed the assessment and treatment plan with the patient. The patient was provided an opportunity to ask questions and all were answered. The patient agreed with the plan and demonstrated an understanding of the instructions.   The patient was advised to call back or seek an in-person evaluation if the symptoms worsen or if the condition fails to improve as anticipated.  I provided 20 minutes of non-face-to-face time during this encounter.   Alfredia Annas, MD  [facility] MD/PA/NP OP Progress Note  06/23/2023 4:38 PM Anne Logan  MRN:  [id number]  Chief Complaint:  Chief Complaint  Patient presents with   Depression   Anxiety   Follow-up   HPI:  This patient is a 58 year old married white female lives with her husband in [place]. 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   The patient returns for follow-up after 3 months regarding her history of bipolar disorder depression and anxiety.  She is still taking care of her husband who is chronically ill with renal failure pancreatitis and COPD as well as helping her siblings take care of her mother who suffered a stroke several months ago.  On top of this she is trying to do her full-time job at home.  She is chronically stressed but does not feel like she can take any time off to help herself.  The only thing she does is go for a walk every day.  She still states that she is gaining weight because she is getting up at night and eating.  We had cut  down the Seroquel  quite a bit but I think we need to keep cutting it back and she is in agreement.  She does not sleep well but a big part of this is that her husband is up most of the night with various problems relating to his illnesses.  She denies significant depression or anxiety but is obviously stressed but does not think she can change anything in her lifestyle right now. Visit Diagnosis:    ICD-10-CM   1. Bipolar disorder, in partial remission, most recent episode depressed (HCC)  F31.75       Past Psychiatric History: Hospitalization many years ago.  At that time she was abusing Xanax  and Valium  Past Medical History:  Past Medical History:  Diagnosis Date   Anxiety    Depression    GERD (gastroesophageal reflux disease)     Past Surgical History:  Procedure Laterality Date   BALLOON DILATION N/A 10/28/2020   Procedure: BALLOON DILATION;  Surgeon: Vinetta Greening, DO;  [facility];  Service: Endoscopy;  Laterality: N/A;   BIOPSY  01/23/2019   Procedure: BIOPSY;  Surgeon: Alyce Jubilee, MD;  [facility];  Service: Endoscopy;;  duodenum gastric   BIOPSY  10/28/2020   Procedure: BIOPSY;  Surgeon: Vinetta Greening, DO;  [facility];  Service: Endoscopy;;   COLONOSCOPY WITH PROPOFOL  N/A 01/23/2019   Dr. Nolene Baumgarten: Significant looping of the colon, internal  hemorrhoids, moderate diverticulosis with restricted mobility of the rectosigmoid colon.  Recommended 5-year follow-up colonoscopy due to prep, polyps less than 3 mm could have been missed.   ESOPHAGOGASTRODUODENOSCOPY (EGD) WITH PROPOFOL  N/A 01/23/2019   Dr. Nolene Baumgarten: Benign-appearing esophageal stricture status post dilation.  Gastritis, no H. pylori.  Small bowel biopsies benign, no celiac.   ESOPHAGOGASTRODUODENOSCOPY (EGD) WITH PROPOFOL  N/A 10/28/2020   Surgeon: Vinetta Greening, DO;  mild Schatzki's ring s/p dilation, gastritis biopsied, normal examined duodenum.  Pathology with antral  and oxyntic mucosa with slight chronic inflammation, negative for H. pylori.   SAVORY DILATION N/A 01/23/2019   Procedure: SAVORY DILATION;  Surgeon: Alyce Jubilee, MD;  [facility];  Service: Endoscopy;  Laterality: N/A;   TUBAL LIGATION Bilateral 2005    Family Psychiatric History: See below  Family History:  Family History  Problem Relation Age of Onset   Depression Mother    Cancer Mother        lymphoma   Alcohol abuse Father    Drug abuse Son    Colon cancer Neg Hx     Social History:  Social History   Socioeconomic History   Marital status: Married    Spouse name: Not on file   Number of children: Not on file   Years of education: Not on file   Highest education level: Not on file  Occupational History   Not on file  Tobacco Use   Smoking status: Every Day    Current packs/day: 0.50    Average packs/day: 0.5 packs/day for 20.0 years (10.0 ttl pk-yrs)    Types: Cigarettes   Smokeless tobacco: Never   Tobacco comments:    smokes 10 cig daily  Vaping Use   Vaping status: Never Used  Substance and Sexual Activity   Alcohol use: Not Currently    Alcohol/week: 0.0 standard drinks of alcohol    Comment: quit 2 years ago   Drug use: No   Sexual activity: Not Currently    Partners: Male    Birth control/protection: Surgical    Comment: tubal  Other Topics Concern   Not on file  Social History Narrative   Not on file   Social Drivers of Health   Financial Resource Strain: Not on file  Food Insecurity: Not on file  Transportation Needs: Not on file  Physical Activity: Not on file  Stress: Not on file  Social Connections: Not on file    Allergies:  Allergies  Allergen Reactions   Other Nausea Only    Per patient Pain medications, any kind   Protonix  [Pantoprazole  Sodium]     Patient reported sore tongue after 2 days of pantoprazole .  No swelling reported.    Metabolic Disorder Labs: Lab Results  Component Value Date   HGBA1C 6.5 (H)  09/16/2022   No results found for: "PROLACTIN" Lab Results  Component Value Date   CHOL 161 02/08/2022   TRIG 239 (H) 02/08/2022   HDL 48 02/08/2022   CHOLHDL 3.4 02/08/2022   VLDL 37 (H) 05/01/2015   LDLCALC 74 02/08/2022   LDLCALC 171 (H) 11/27/2021   Lab Results  Component Value Date   TSH 0.697 11/27/2021   TSH 0.58 06/13/2020    Therapeutic Level Labs: No results found for: "LITHIUM" Lab Results  Component Value Date   VALPROATE 69.0 01/31/2020   VALPROATE 88 02/08/2018   No results found for: "CBMZ"  Current Medications: Current Outpatient Medications  Medication Sig Dispense Refill  QUEtiapine  (SEROQUEL ) 50 MG tablet Take 1 tablet (50 mg total) by mouth 2 (two) times daily. 60 tablet 2   umeclidinium-vilanterol (ANORO ELLIPTA ) 62.5-25 MCG/ACT AEPB Inhale one puff each morning for breathing and wheezing 1 each 0   ALPRAZolam  (XANAX ) 1 MG tablet Take 1 tablet (1 mg total) by mouth 3 (three) times daily as needed for anxiety. 90 tablet 2   aspirin-acetaminophen -caffeine (EXCEDRIN MIGRAINE) 250-250-65 MG tablet Take 2 tablets by mouth every 8 (eight) hours as needed for headache.      cephALEXin  (KEFLEX ) 500 MG capsule Take 1 capsule (500 mg total) by mouth 2 (two) times daily. 14 capsule 0   dexlansoprazole  (DEXILANT ) 60 MG capsule TAKE ONE CAPSULE BY MOUTH EVERY DAY 30 capsule 5   divalproex  (DEPAKOTE  ER) 500 MG 24 hr tablet Take 2 tablets (1,000 mg total) by mouth daily. 60 tablet 2   Omega-3 Fatty Acids (FISH OIL PO) Take by mouth.     rosuvastatin  (CRESTOR ) 10 MG tablet Take 1 tablet (10 mg total) by mouth daily. 90 tablet 0   zolpidem  (AMBIEN  CR) 12.5 MG CR tablet Take 1 tablet (12.5 mg total) by mouth at bedtime as needed for sleep. 30 tablet 2   No current facility-administered medications for this visit.     Musculoskeletal: Strength & Muscle Tone: within normal limits Gait & Station: normal Patient leans: N/A  Psychiatric Specialty Exam: Review of  Systems  Psychiatric/Behavioral:  Positive for sleep disturbance.   All other systems reviewed and are negative.   Last menstrual period 04/13/2015.There is no height or weight on file to calculate BMI.  General Appearance: Casual and Fairly Groomed  Eye Contact:  Good  Speech:  Clear and Coherent  Volume:  Normal  Mood:  Dysphoric  Affect:  Flat  Thought Process:  Goal Directed  Orientation:  Full (Time, Place, and Person)  Thought Content: Rumination   Suicidal Thoughts:  No  Homicidal Thoughts:  No  Memory:  Immediate;   Good Recent;   Good Remote;   Good  Judgement:  Good  Insight:  Good  Psychomotor Activity:  Normal  Concentration:  Concentration: Good and Attention Span: Good  Recall:  Good  Fund of Knowledge: Good  Language: Good  Akathisia:  No  Handed:  Right  AIMS (if indicated): not done  Assets:  Communication Skills Desire for Improvement Physical Health Resilience Social Support  ADL's:  Intact  Cognition: WNL  Sleep:  Poor   Screenings: GAD-7    Flowsheet Row Office Visit from 09/16/2022 in [facility] Office Visit from 11/27/2021 in [facility] Office Visit from 11/20/2021 in [facility]  Total GAD-7 Score 21 14 18       PHQ2-9    Flowsheet Row Office Visit from 09/16/2022 in [facility] Office Visit from 12/07/2021 in [facility] Office Visit from 11/27/2021 in [facility] Office Visit from 11/20/2021 in [facility] Office Visit from 08/20/2021 in [facility] Total Score 6 0 4 1 1   PHQ-9 Total Score 22 -- 18 9 22       Flowsheet Row Office Visit from 08/20/2021 in [facility] [place] Video Visit from 05/18/2021 in [facility] at [place] Video Visit from  02/18/2021 in [facility] at [place]  C-SSRS RISK CATEGORY No Risk  No Risk No Risk        Assessment and Plan: This patient is a 58 year old female with a history of bipolar disorder anxiety insomnia.  She is obviously stressed between taking care of her mother and her husband and working.  I have urged her to try to take some time out for herself.  She is not sleeping well but has tried numerous medicines for sleep.  For now she wants to stay with the Ambien  CR 12.5 mg at bedtime for sleep.  Will cut down Seroquel  to 50 mg at bedtime because of her overeating and continue Xanax  1 mg 3 times daily for anxiety and Depakote  ER 1000 mg at bedtime for mood stabilization.  She will return to see me in 3 months  Collaboration of Care: Collaboration of Care: Primary Care Provider AEB notes to be shared with PCP at patient's request  Patient/Guardian was advised Release of Information must be obtained prior to any record release in order to collaborate their care with an outside provider. Patient/Guardian was advised if they have not already done so to contact the registration department to sign all necessary forms in order for us  to release information regarding their care.   Consent: Patient/Guardian gives verbal consent for treatment and assignment of benefits for services provided during this visit. Patient/Guardian expressed understanding and agreed to proceed.    Alfredia Annas, MD 06/23/2023, 4:38 PM

## 2023-06-27 ENCOUNTER — Telehealth (HOSPITAL_COMMUNITY): Payer: Self-pay | Admitting: *Deleted

## 2023-06-27 ENCOUNTER — Other Ambulatory Visit (HOSPITAL_COMMUNITY): Payer: Self-pay | Admitting: Psychiatry

## 2023-06-27 MED ORDER — QUETIAPINE FUMARATE 100 MG PO TABS: 100.0000 mg | ORAL_TABLET | Freq: Every day | ORAL | 2 refills | Status: DC

## 2023-06-27 NOTE — Telephone Encounter (Signed)
 Patient aware.

## 2023-06-27 NOTE — Telephone Encounter (Signed)
 Per pt she have not had no seep since her Seroquel  was dropped. Per pt she started it Saturday night. Per pt she don't know what else to do. Per pt she would like advise from provider.

## 2023-06-27 NOTE — Telephone Encounter (Signed)
 I sent the 100 mg back in, she can take 2 of the 50 mg to use them up

## 2023-07-14 ENCOUNTER — Other Ambulatory Visit (HOSPITAL_COMMUNITY): Payer: Self-pay | Admitting: Psychiatry

## 2023-07-26 ENCOUNTER — Other Ambulatory Visit (HOSPITAL_COMMUNITY): Payer: Self-pay | Admitting: Psychiatry

## 2023-08-08 ENCOUNTER — Ambulatory Visit: Payer: Self-pay

## 2023-08-08 NOTE — Telephone Encounter (Signed)
 Appt scheduled

## 2023-08-08 NOTE — Telephone Encounter (Signed)
 FYI Only or Action Required?: FYI only for provider.  Patient was last seen in primary care on 12/07/2022 by Cook, Jayce G, DO.  Called Nurse Triage reporting Back Pain.  Symptoms began several months ago.  Interventions attempted: OTC medications: Excedrin.  Symptoms are: bilateral flank pain constant and varies in severity gradually worsening x 4 weeks.  Triage Disposition: See Physician Within 24 Hours  Patient/caregiver understands and will follow disposition?: Yes             Copied from CRM [id number]. Topic: Clinical - Red Word Triage >> Aug 08, 2023 10:18 AM Avram MATSU wrote: Red Word that prompted transfer to Nurse Triage: back pain Reason for Disposition  MODERATE pain (e.g., interferes with normal activities or awakens from sleep)  Answer Assessment - Initial Assessment Questions 1. ONSET: When did the pain begin? (e.g., minutes, hours, days)     Worsened over the past 4 weeks.  2. LOCATION: Where does it hurt? (upper, mid or lower back)     She states all the way across, where my kidneys are. Just above hips.  3. SEVERITY: How bad is the pain?  (e.g., Scale 1-10; mild, moderate, or severe)     She states it varies. She states now it is a 7/10.  4. PATTERN: Is the pain constant? (e.g., yes, no; constant, intermittent)      Constant.  5. RADIATION: Does the pain shoot into your legs or somewhere else?     No.  6. CAUSE:  What do you think is causing the back pain?      Unsure.   7. BACK OVERUSE:  Any recent lifting of heavy objects, strenuous work or exercise?     She states her husband has been falling and she has to help pick him up which has made the back pain worse.  8. MEDICINES: What have you taken so far for the pain? (e.g., nothing, acetaminophen , NSAIDS)     Excedrin, she states not helping much.  9. NEUROLOGIC SYMPTOMS: Do you have any weakness, numbness, or problems with bowel/bladder control?     No numbness, weakness,  new bowel or bladder control issues.  10. OTHER SYMPTOMS: Do you have any other symptoms? (e.g., fever, abdomen pain, burning with urination, blood in urine)       Patient denies nausea, vomiting, fevers, urinary frequency, burning with urination, blood in urine.  11. PREGNANCY: Is there any chance you are pregnant? When was your last menstrual period?       N/A.  Protocols used: Back Pain-A-AH, Flank Pain-A-AH

## 2023-08-09 ENCOUNTER — Encounter: Payer: Self-pay | Admitting: Nurse Practitioner

## 2023-08-09 ENCOUNTER — Ambulatory Visit: Payer: PRIVATE HEALTH INSURANCE | Admitting: Nurse Practitioner

## 2023-08-09 VITALS — BP 134/78 | HR 89 | Temp 98.1°F | Ht 62.0 in | Wt 128.0 lb

## 2023-08-09 DIAGNOSIS — M545 Low back pain, unspecified: Secondary | ICD-10-CM

## 2023-08-09 DIAGNOSIS — M546 Pain in thoracic spine: Secondary | ICD-10-CM | POA: Diagnosis not present

## 2023-08-09 DIAGNOSIS — Z636 Dependent relative needing care at home: Secondary | ICD-10-CM | POA: Diagnosis not present

## 2023-08-09 DIAGNOSIS — G8929 Other chronic pain: Secondary | ICD-10-CM | POA: Diagnosis not present

## 2023-08-09 NOTE — Patient Instructions (Addendum)
 Anne Logan [phone] Lidocaine  patch Biofreeze Ice heat Low Back Sprain or Strain Rehab Ask your health care provider which exercises are safe for you. Do exercises exactly as told by your health care provider and adjust them as directed. It is normal to feel mild stretching, pulling, tightness, or discomfort as you do these exercises. Stop right away if you feel sudden pain or your pain gets worse. Do not begin these exercises until told by your health care provider. Stretching and range-of-motion exercises These exercises warm up your muscles and joints and improve the movement and flexibility of your back. These exercises also help to relieve pain, numbness, and tingling. Lumbar rotation  Lie on your back on a firm bed or the floor with your knees bent. Straighten your arms out to your sides so each arm forms a 90-degree angle (right angle) with a side of your body. Slowly move (rotate) both of your knees to one side of your body until you feel a stretch in your lower back (lumbar). Try not to let your shoulders lift off the floor. Hold this position for __________ seconds. Tense your abdominal muscles and slowly move your knees back to the starting position. Repeat this exercise on the other side of your body. Repeat __________ times. Complete this exercise __________ times a day. Single knee to chest  Lie on your back on a firm bed or the floor with both legs straight. Bend one of your knees. Use your hands to move your knee up toward your chest until you feel a gentle stretch in your lower back and buttock. Hold your leg in this position by holding on to the front of your knee. Keep your other leg as straight as possible. Hold this position for __________ seconds. Slowly return to the starting position. Repeat with your other leg. Repeat __________ times. Complete this exercise __________ times a day. Prone extension on elbows  Lie on your abdomen on a firm bed or the floor  (prone position). Prop yourself up on your elbows. Use your arms to help lift your chest up until you feel a gentle stretch in your abdomen and your lower back. This will place some of your body weight on your elbows. If this is uncomfortable, try stacking pillows under your chest. Your hips should stay down, against the surface that you are lying on. Keep your hip and back muscles relaxed. Hold this position for __________ seconds. Slowly relax your upper body and return to the starting position. Repeat __________ times. Complete this exercise __________ times a day. Strengthening exercises These exercises build strength and endurance in your back. Endurance is the ability to use your muscles for a long time, even after they get tired. Pelvic tilt This exercise strengthens the muscles that lie deep in the abdomen. Lie on your back on a firm bed or the floor with your legs extended. Bend your knees so they are pointing toward the ceiling and your feet are flat on the floor. Tighten your lower abdominal muscles to press your lower back against the floor. This motion will tilt your pelvis so your tailbone points up toward the ceiling instead of pointing to your feet or the floor. To help with this exercise, you may place a small towel under your lower back and try to push your back into the towel. Hold this position for __________ seconds. Let your muscles relax completely before you repeat this exercise. Repeat __________ times. Complete this exercise __________ times a day. Alternating arm  and leg raises  Get on your hands and knees on a firm surface. If you are on a hard floor, you may want to use padding, such as an exercise mat, to cushion your knees. Line up your arms and legs. Your hands should be directly below your shoulders, and your knees should be directly below your hips. Lift your left leg behind you. At the same time, raise your right arm and straighten it in front of you. Do not  lift your leg higher than your hip. Do not lift your arm higher than your shoulder. Keep your abdominal and back muscles tight. Keep your hips facing the ground. Do not arch your back. Keep your balance carefully, and do not hold your breath. Hold this position for __________ seconds. Slowly return to the starting position. Repeat with your right leg and your left arm. Repeat __________ times. Complete this exercise __________ times a day. Abdominal set with straight leg raise  Lie on your back on a firm bed or the floor. Bend one of your knees and keep your other leg straight. Tense your abdominal muscles and lift your straight leg up, 4-6 inches (10-15 cm) off the ground. Keep your abdominal muscles tight and hold this position for __________ seconds. Do not hold your breath. Do not arch your back. Keep it flat against the ground. Keep your abdominal muscles tense as you slowly lower your leg back to the starting position. Repeat with your other leg. Repeat __________ times. Complete this exercise __________ times a day. Single leg lower with bent knees Lie on your back on a firm bed or the floor. Tense your abdominal muscles and lift your feet off the floor, one foot at a time, so your knees and hips are bent in 90-degree angles (right angles). Your knees should be over your hips and your lower legs should be parallel to the floor. Keeping your abdominal muscles tense and your knee bent, slowly lower one of your legs so your toe touches the ground. Lift your leg back up to return to the starting position. Do not hold your breath. Do not let your back arch. Keep your back flat against the ground. Repeat with your other leg. Repeat __________ times. Complete this exercise __________ times a day. Posture and body mechanics Good posture and healthy body mechanics can help to relieve stress in your body's tissues and joints. Body mechanics refers to the movements and positions of your  body while you do your daily activities. Posture is part of body mechanics. Good posture means: Your spine is in its natural S-curve position (neutral). Your shoulders are pulled back slightly. Your head is not tipped forward (neutral). Follow these guidelines to improve your posture and body mechanics in your everyday activities. Standing  When standing, keep your spine neutral and your feet about hip-width apart. Keep a slight bend in your knees. Your ears, shoulders, and hips should line up. When you do a task in which you stand in one place for a long time, place one foot up on a stable object that is 2-4 inches (5-10 cm) high, such as a footstool. This helps keep your spine neutral. Sitting  When sitting, keep your spine neutral and keep your feet flat on the floor. Use a footrest, if necessary, and keep your thighs parallel to the floor. Avoid rounding your shoulders, and avoid tilting your head forward. When working at a desk or a computer, keep your desk at a height where your hands  are slightly lower than your elbows. Slide your chair under your desk so you are close enough to maintain good posture. When working at a computer, place your monitor at a height where you are looking straight ahead and you do not have to tilt your head forward or downward to look at the screen. Resting When lying down and resting, avoid positions that are most painful for you. If you have pain with activities such as sitting, bending, stooping, or squatting, lie in a position in which your body does not bend very much. For example, avoid curling up on your side with your arms and knees near your chest (fetal position). If you have pain with activities such as standing for a long time or reaching with your arms, lie with your spine in a neutral position and bend your knees slightly. Try the following positions: Lying on your side with a pillow between your knees. Lying on your back with a pillow under your  knees. Lifting  When lifting objects, keep your feet at least shoulder-width apart and tighten your abdominal muscles. Bend your knees and hips and keep your spine neutral. It is important to lift using the strength of your legs, not your back. Do not lock your knees straight out. Always ask for help to lift heavy or awkward objects. This information is not intended to replace advice given to you by your health care provider. Make sure you discuss any questions you have with your health care provider. Document Revised: 05/10/2022 Document Reviewed: 03/24/2020 Elsevier Patient Education  2024 [employer].

## 2023-08-12 ENCOUNTER — Encounter: Payer: Self-pay | Admitting: Nurse Practitioner

## 2023-08-12 DIAGNOSIS — G8929 Other chronic pain: Secondary | ICD-10-CM | POA: Insufficient documentation

## 2023-08-12 DIAGNOSIS — Z636 Dependent relative needing care at home: Secondary | ICD-10-CM | POA: Insufficient documentation

## 2023-08-12 NOTE — Progress Notes (Signed)
 Subjective:    Patient ID: Anne Logan, female    DOB: 04-Feb-1965, 58 y.o.   MRN: [id number]  HPI Presents for complaints of generalized back pain that has been going on for a long time, worse over the past 4 weeks.  Has been providing care for her husband.  Has had to help him get up from the floor after falling.  His care does require a lot of pulling and lifting.  No history of injury to the back.  No history of back surgery.  Denies any urinary symptoms.  Describes pain going down into the bilateral hip area into the thigh area at times.  No weakness or numbness. Stays under tremendous stress taking care of her husband.  Has limited assistance. Had to get a neighbor to help at night recently when he fell in the floor. He has refused to go to rehab for PT and OT. Patient gets regular follow-up with local mental health provider. No change in bowel or bladder habits.  Has not tried anything specifically for pain.  Is limited with on what she can take for pain due to her current drug regiment and history of GERD.   Review of Systems  Constitutional:  Positive for fatigue.  Respiratory:  Negative for cough, chest tightness and shortness of breath.   Cardiovascular:  Negative for chest pain.  Musculoskeletal:  Positive for arthralgias, back pain and gait problem.  Psychiatric/Behavioral:  Negative for suicidal ideas.       08/09/2023    3:53 PM  Depression screen PHQ 2/9  Decreased Interest 3  Down, Depressed, Hopeless 3  PHQ - 2 Score 6  Altered sleeping 3  Tired, decreased energy 3  Change in appetite 3  Feeling bad or failure about yourself  3  Trouble concentrating 0  Moving slowly or fidgety/restless 0  Suicidal thoughts 0  PHQ-9 Score 18  Difficult doing work/chores Very difficult      08/09/2023    3:53 PM 09/16/2022   10:12 AM 11/27/2021    9:30 AM 11/20/2021    2:48 PM  GAD 7 : Generalized Anxiety Score  Nervous, Anxious, on Edge 3 3 2 3   Control/stop worrying 3 3 2  3   Worry too much - different things 3 3 2 3   Trouble relaxing 3 3 2 3   Restless 3 3 2 3   Easily annoyed or irritable 3 3 2  0  Afraid - awful might happen 3 3 2 3   Total GAD 7 Score 21 21 14 18   Anxiety Difficulty Very difficult Very difficult Somewhat difficult     Social History   Tobacco Use   Smoking status: Every Day    Current packs/day: 0.50    Average packs/day: 0.5 packs/day for 20.0 years (10.0 ttl pk-yrs)    Types: Cigarettes   Smokeless tobacco: Never   Tobacco comments:    smokes 10 cig daily  Vaping Use   Vaping status: Never Used  Substance Use Topics   Alcohol use: Not Currently    Alcohol/week: 0.0 standard drinks of alcohol    Comment: quit 2 years ago   Drug use: No        Objective:   Physical Exam Vitals and nursing note reviewed.  Constitutional:      General: She is not in acute distress. Cardiovascular:     Rate and Rhythm: Normal rate and regular rhythm.  Pulmonary:     Effort: Pulmonary effort is normal.  Breath sounds: Normal breath sounds.  Neurological:     Mental Status: She is alert and oriented to person, place, and time.     Motor: No weakness.     Gait: Gait normal.     Comments: No spinal tenderness to percussion.  Minimal generalized tenderness with palpation.  SLR negative bilaterally.  Reflexes normal limit lower extremity.  Patient can get on and off exam table without difficulty.  Gets out of chair slowly but without assistance.  Psychiatric:        Mood and Affect: Mood normal.        Behavior: Behavior normal.        Thought Content: Thought content normal.        Judgment: Judgment normal.     Comments: Speech clear.  Making good eye contact.  Very anxious affect.  Dressed appropriately for the weather.    Today's Vitals   08/09/23 1551  BP: 134/78  Pulse: 89  Temp: 98.1 F (36.7 C)  SpO2: 97%  Weight: 128 lb (58.1 kg)  Height: 5' 2 (1.575 m)   Body mass index is 23.41 kg/m.         Assessment &  Plan:   Problem List Items Addressed This Visit       Other   Caregiver stress   Chronic bilateral low back pain without sciatica   Relevant Orders   DG Thoracic Spine 2 View   DG Lumbar Spine Complete   Chronic bilateral thoracic back pain - Primary   Relevant Orders   DG Thoracic Spine 2 View   DG Lumbar Spine Complete   Xrays of the back ordered. Recommend OTC topicals, ice/heat applications and stretching exercises. Consider PT is possible.  Note that patient decided to DC Rosuvastatin  and Anoro Ellipta  for now.  Follow up with mental health. Discussed the importance of stress reduction. Recommend physical this year and consider bone density test. Return if symptoms worsen or fail to improve.

## 2023-08-16 ENCOUNTER — Ambulatory Visit (HOSPITAL_COMMUNITY)
Admission: RE | Admit: 2023-08-16 | Discharge: 2023-08-16 | Disposition: A | Discharge status: Home / Self Care | Payer: PRIVATE HEALTH INSURANCE | Source: Ambulatory Visit | Attending: Nurse Practitioner | Admitting: Nurse Practitioner

## 2023-08-16 DIAGNOSIS — G8929 Other chronic pain: Secondary | ICD-10-CM | POA: Insufficient documentation

## 2023-08-16 DIAGNOSIS — M545 Low back pain, unspecified: Secondary | ICD-10-CM | POA: Diagnosis present

## 2023-08-16 DIAGNOSIS — M546 Pain in thoracic spine: Secondary | ICD-10-CM | POA: Insufficient documentation

## 2023-08-23 ENCOUNTER — Ambulatory Visit: Payer: Self-pay | Admitting: Nurse Practitioner

## 2023-09-06 ENCOUNTER — Encounter: Payer: Self-pay | Admitting: Nurse Practitioner

## 2023-09-06 ENCOUNTER — Ambulatory Visit: Payer: PRIVATE HEALTH INSURANCE | Admitting: Nurse Practitioner

## 2023-09-06 ENCOUNTER — Ambulatory Visit: Payer: Self-pay

## 2023-09-06 VITALS — BP 111/78 | HR 91 | Temp 97.9°F | Ht 62.0 in | Wt 127.0 lb

## 2023-09-06 DIAGNOSIS — N3001 Acute cystitis with hematuria: Secondary | ICD-10-CM

## 2023-09-06 DIAGNOSIS — R300 Dysuria: Secondary | ICD-10-CM

## 2023-09-06 DIAGNOSIS — N390 Urinary tract infection, site not specified: Secondary | ICD-10-CM

## 2023-09-06 LAB — POCT URINALYSIS DIP (CLINITEK)
Bilirubin, UA: NEGATIVE
Glucose, UA: NEGATIVE mg/dL
Ketones, POC UA: NEGATIVE mg/dL
Nitrite, UA: NEGATIVE
Spec Grav, UA: 1.01 (ref 1.010–1.025)
Urobilinogen, UA: 0.2 U/dL
pH, UA: 6.5 (ref 5.0–8.0)

## 2023-09-06 MED ORDER — CEPHALEXIN 500 MG PO CAPS: 500.0000 mg | ORAL_CAPSULE | Freq: Two times a day (BID) | ORAL | 0 refills | Status: DC

## 2023-09-06 NOTE — Telephone Encounter (Signed)
 FYI Only or Action Required?: Action required by provider: request for appointment.  Patient was last seen in primary care on 08/09/2023 by Mauro Elveria BROCKS, NP.  Called Nurse Triage reporting Urinary Tract Infection.  Symptoms began yesterday.  Interventions attempted: Nothing.  Symptoms are: gradually worsening.  Triage Disposition: See Physician Within 24 Hours  Patient/caregiver understands and will follow disposition?: YesCopied from CRM #[id number]. Topic: Clinical - Red Word Triage >> Sep 06, 2023  8:03 AM Precious C wrote: [facility] that prompted transfer to Nurse Triage: SEVERE PAIN  Pt called in due pain and pressure when urinatring, painn is 8/10 Reason for Disposition  Urinating more frequently than usual (i.e., frequency) OR new-onset of the feeling of an urgent need to urinate (i.e., urgency)  Answer Assessment - Initial Assessment Questions Ive gone every hour throughout the night.    1. SYMPTOM: What's the main symptom you're concerned about? (e.g., frequency, incontinence)     Pain/pressure 2. ONSET: When did the    start?     Last night  3. PAIN: Is there any pain? If Yes, ask: How bad is it? (Scale: 1-10; mild, moderate, severe)     8 4. CAUSE: What do you think is causing the symptoms?     Possible UTI 5. OTHER SYMPTOMS: Do you have any other symptoms? (e.g., blood in urine, fever, flank pain, pain with urination)     urgnecy  Protocols used: Urinary Symptoms-A-AH

## 2023-09-06 NOTE — Progress Notes (Unsigned)
 Subjective:    Patient ID: Anne Logan, female    DOB: 07/03/1965, 58 y.o.   MRN: [id number]  HPI SUBJECTIVE: Anne Logan is a 58 y.o. female who complains of urinary frequency, urgency and dysuria x 1 days, without flank pain, fever, chills, or abnormal vaginal discharge or bleeding. No new sexual partners. Last UTI 12/07/22. Taking fluids well.  Note: sees Dr. Okey for mental health   Review of Systems  Constitutional:  Negative for fever.  Respiratory:  Negative for cough, chest tightness and shortness of breath.   Cardiovascular:  Negative for chest pain.  Gastrointestinal:  Positive for abdominal pain. Negative for abdominal distention, nausea and vomiting.       Suprapubic pain and pressure with spasms before and while voiding  Genitourinary:  Positive for dysuria and frequency. Negative for flank pain, urgency and vaginal discharge.       Patient reports voiding every 30 minutes to 1 hour throughout night since 8/18. She states that she has not recognized an odor or color change.    Social History   Tobacco Use   Smoking status: Every Day    Current packs/day: 0.50    Average packs/day: 0.5 packs/day for 20.0 years (10.0 ttl pk-yrs)    Types: Cigarettes   Smokeless tobacco: Never   Tobacco comments:    smokes 10 cig daily  Vaping Use   Vaping status: Never Used  Substance Use Topics   Alcohol use: Not Currently    Alcohol/week: 0.0 standard drinks of alcohol    Comment: quit 2 years ago   Drug use: No        Objective:   Physical Exam Vitals and nursing note reviewed.  Constitutional:      General: She is not in acute distress.    Appearance: Normal appearance.  Cardiovascular:     Rate and Rhythm: Normal rate and regular rhythm.  Pulmonary:     Effort: Pulmonary effort is normal.     Breath sounds: Normal breath sounds.  Abdominal:     General: Abdomen is flat. There is no distension.     Palpations: Abdomen is soft.     Tenderness: There is  abdominal tenderness. There is no right CVA tenderness, left CVA tenderness, guarding or rebound.     Comments: Mild Suprapubic tenderness upon palpation  Skin:    General: Skin is warm and dry.  Neurological:     Mental Status: She is alert and oriented to person, place, and time.  Psychiatric:        Mood and Affect: Mood normal.        Behavior: Behavior normal.        Thought Content: Thought content normal.        Judgment: Judgment normal.    Vitals:   09/06/23 1015  BP: 111/78  Pulse: 91  Temp: 97.9 F (36.6 C)  Height: 5' 2 (1.575 m)  Weight: 57.6 kg  SpO2: 98%  BMI (Calculated): 23.22    Results for orders placed or performed in visit on 09/06/23  POCT URINALYSIS DIP (CLINITEK)   Collection Time: 09/06/23 10:23 AM  Result Value Ref Range   Color, UA yellow yellow   Clarity, UA cloudy (A) clear   Glucose, UA negative negative mg/dL   Bilirubin, UA negative negative   Ketones, POC UA negative negative mg/dL   Spec Grav, UA 8.989 8.989 - 1.025   Blood, UA large (A) negative   pH, UA 6.5 5.0 -  8.0   POC PROTEIN,UA trace negative, trace   Urobilinogen, UA 0.2 0.2 or 1.0 E.U./dL   Nitrite, UA Negative Negative   Leukocytes, UA Moderate (2+) (A) Negative          Assessment & Plan:   Problem List Items Addressed This Visit   None Visit Diagnoses       Dysuria    -  Primary   Relevant Orders   POCT URINALYSIS DIP (CLINITEK) (Completed)   Urine Culture     Urinary tract infection with hematuria, site unspecified          Meds ordered this encounter  Medications   cephALEXin  (KEFLEX ) 500 MG capsule    Sig: Take 1 capsule (500 mg total) by mouth 2 (two) times daily.    Dispense:  10 capsule    Refill:  0    Supervising Provider:   ALPHONSA HAMILTON A [[id number]]    PLAN: Increase fluids, may use Pyridium OTC as directed prn for no more than 48 hours. Start Keflex  as directed.  Return if symptoms worsen, do not improve or new symptoms develop such as n/v or  systemic symptoms such as fever or chills.  Further follow up based on culture report.   I have seen and examined this patient alongside the NP student. I have reviewed and verified the student note and agree with the assessment and plan.  Elveria Quarry, FNP

## 2023-09-08 ENCOUNTER — Encounter: Payer: Self-pay | Admitting: Nurse Practitioner

## 2023-09-08 ENCOUNTER — Ambulatory Visit: Payer: Self-pay | Admitting: Nurse Practitioner

## 2023-09-08 LAB — URINE CULTURE

## 2023-09-08 LAB — SPECIMEN STATUS REPORT

## 2023-09-18 ENCOUNTER — Other Ambulatory Visit (HOSPITAL_COMMUNITY): Payer: Self-pay | Admitting: Psychiatry

## 2023-09-27 ENCOUNTER — Ambulatory Visit (INDEPENDENT_AMBULATORY_CARE_PROVIDER_SITE_OTHER): Payer: PRIVATE HEALTH INSURANCE | Admitting: Psychiatry

## 2023-09-27 ENCOUNTER — Encounter (HOSPITAL_COMMUNITY): Payer: Self-pay | Admitting: Psychiatry

## 2023-09-27 VITALS — BP 127/84 | HR 118 | Ht 62.0 in | Wt 126.8 lb

## 2023-09-27 DIAGNOSIS — F3175 Bipolar disorder, in partial remission, most recent episode depressed: Secondary | ICD-10-CM

## 2023-09-27 MED ORDER — ALPRAZOLAM 1 MG PO TABS: 1.0000 mg | ORAL_TABLET | Freq: Three times a day (TID) | ORAL | 2 refills | Status: DC | PRN

## 2023-09-27 MED ORDER — DIVALPROEX SODIUM ER 500 MG PO TB24: 1000.0000 mg | ORAL_TABLET | Freq: Every day | ORAL | 2 refills | Status: DC

## 2023-09-27 MED ORDER — QUETIAPINE FUMARATE 100 MG PO TABS: 100.0000 mg | ORAL_TABLET | Freq: Every day | ORAL | 2 refills | Status: DC

## 2023-09-27 MED ORDER — ZOLPIDEM TARTRATE ER 12.5 MG PO TBCR: 12.5000 mg | EXTENDED_RELEASE_TABLET | Freq: Every evening | ORAL | 2 refills | Status: DC | PRN

## 2023-09-27 NOTE — Progress Notes (Signed)
 [facility] MD/PA/NP OP Progress Note  09/27/2023 4:19 PM Anne Logan  MRN:  [id number]  Chief Complaint:  Chief Complaint  Patient presents with   Anxiety   Depression   Follow-up   HPI: This patient is a 58 year old married white female lives with her husband in [place]. 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   The patient returns for follow-up after 3 months regarding her history of bipolar disorder depression and anxiety.  She does seem tired and stressed.  She is still taking care of her chronically ill husband who has renal failure pancreatitis and COPD.  Her mother has dementia and she helps her sister care for her as well.  Her son has addiction problems and legal issues.  She seems somewhat overwhelmed but she is handling it so far.  I strongly urged her to take some time off and just go somewhere for few days and rest but she claims that she cannot.  Last time we cut down her Seroquel  because she claims she was gaining weight.  However today she is only 126 pounds so she is actually lost weight.  She states that she felt better with the 100 mg Seroquel  at night because she slept better and I think that we should reinstate this. Visit Diagnosis:    ICD-10-CM   1. Bipolar disorder, in partial remission, most recent episode depressed (HCC)  F31.75       Past Psychiatric History: Hospitalization many years ago. At that time she was abusing Xanax  and Valium   Past Medical History:  Past Medical History:  Diagnosis Date   Anxiety    Depression    GERD (gastroesophageal reflux disease)     Past Surgical History:  Procedure Laterality Date   BALLOON DILATION N/A 10/28/2020   Procedure: BALLOON DILATION;  Surgeon: Cindie Carlin POUR, DO;  [facility];  Service: Endoscopy;  Laterality: N/A;   BIOPSY  01/23/2019   Procedure: BIOPSY;  Surgeon: Harvey Margo CROME, MD;  [facility];  Service: Endoscopy;;   duodenum gastric   BIOPSY  10/28/2020   Procedure: BIOPSY;  Surgeon: Cindie Carlin POUR, DO;  [facility];  Service: Endoscopy;;   COLONOSCOPY WITH PROPOFOL  N/A 01/23/2019   Dr. Harvey: Significant looping of the colon, internal hemorrhoids, moderate diverticulosis with restricted mobility of the rectosigmoid colon.  Recommended 5-year follow-up colonoscopy due to prep, polyps less than 3 mm could have been missed.   ESOPHAGOGASTRODUODENOSCOPY (EGD) WITH PROPOFOL  N/A 01/23/2019   Dr. Harvey: Benign-appearing esophageal stricture status post dilation.  Gastritis, no H. pylori.  Small bowel biopsies benign, no celiac.   ESOPHAGOGASTRODUODENOSCOPY (EGD) WITH PROPOFOL  N/A 10/28/2020   Surgeon: Cindie Carlin POUR, DO;  mild Schatzki's ring s/p dilation, gastritis biopsied, normal examined duodenum.  Pathology with antral and oxyntic mucosa with slight chronic inflammation, negative for H. pylori.   SAVORY DILATION N/A 01/23/2019   Procedure: SAVORY DILATION;  Surgeon: Harvey Margo CROME, MD;  [facility];  Service: Endoscopy;  Laterality: N/A;   TUBAL LIGATION Bilateral 2005    Family Psychiatric History: See below  Family History:  Family History  Problem Relation Age of Onset   Depression Mother    Cancer Mother        lymphoma   Alcohol abuse Father    Drug abuse Son    Colon cancer Neg Hx     Social History:  Social History  Socioeconomic History   Marital status: Married    Spouse name: Not on file   Number of children: Not on file   Years of education: Not on file   Highest education level: Not on file  Occupational History   Not on file  Tobacco Use   Smoking status: Every Day    Current packs/day: 0.50    Average packs/day: 0.5 packs/day for 20.0 years (10.0 ttl pk-yrs)    Types: Cigarettes   Smokeless tobacco: Never   Tobacco comments:    smokes 10 cig daily  Vaping Use   Vaping status: Never Used  Substance and Sexual Activity   Alcohol use:  Not Currently    Alcohol/week: 0.0 standard drinks of alcohol    Comment: quit 2 years ago   Drug use: No   Sexual activity: Not Currently    Partners: Male    Birth control/protection: Surgical    Comment: tubal  Other Topics Concern   Not on file  Social History Narrative   Not on file   Social Drivers of Health   Financial Resource Strain: Not on file  Food Insecurity: Not on file  Transportation Needs: Not on file  Physical Activity: Not on file  Stress: Not on file  Social Connections: Not on file    Allergies:  Allergies  Allergen Reactions   Other Nausea Only    Per patient Pain medications, any kind   Protonix  [Pantoprazole  Sodium]     Patient reported sore tongue after 2 days of pantoprazole .  No swelling reported.    Metabolic Disorder Labs: Lab Results  Component Value Date   HGBA1C 6.5 (H) 09/16/2022   No results found for: PROLACTIN Lab Results  Component Value Date   CHOL 161 02/08/2022   TRIG 239 (H) 02/08/2022   HDL 48 02/08/2022   CHOLHDL 3.4 02/08/2022   VLDL 37 (H) 05/01/2015   LDLCALC 74 02/08/2022   LDLCALC 171 (H) 11/27/2021   Lab Results  Component Value Date   TSH 0.697 11/27/2021   TSH 0.58 06/13/2020    Therapeutic Level Labs: No results found for: LITHIUM Lab Results  Component Value Date   VALPROATE 69.0 01/31/2020   VALPROATE 88 02/08/2018   No results found for: CBMZ  Current Medications: Current Outpatient Medications  Medication Sig Dispense Refill   aspirin-acetaminophen -caffeine (EXCEDRIN MIGRAINE) 250-250-65 MG tablet Take 2 tablets by mouth every 8 (eight) hours as needed for headache.      cephALEXin  (KEFLEX ) 500 MG capsule Take 1 capsule (500 mg total) by mouth 2 (two) times daily. 10 capsule 0   dexlansoprazole  (DEXILANT ) 60 MG capsule TAKE ONE CAPSULE BY MOUTH EVERY DAY 30 capsule 5   Omega-3 Fatty Acids (FISH OIL PO) Take by mouth.     QUEtiapine  (SEROQUEL ) 100 MG tablet Take 1 tablet (100 mg total)  by mouth at bedtime. 30 tablet 2   ALPRAZolam  (XANAX ) 1 MG tablet Take 1 tablet (1 mg total) by mouth 3 (three) times daily as needed for anxiety. 90 tablet 2   divalproex  (DEPAKOTE  ER) 500 MG 24 hr tablet Take 2 tablets (1,000 mg total) by mouth daily. 60 tablet 2   zolpidem  (AMBIEN  CR) 12.5 MG CR tablet Take 1 tablet (12.5 mg total) by mouth at bedtime as needed for sleep. 30 tablet 2   No current facility-administered medications for this visit.     Musculoskeletal: Strength & Muscle Tone: within normal limits Gait & Station: normal Patient leans: N/A  Psychiatric  Specialty Exam: Review of Systems  Psychiatric/Behavioral:  Positive for dysphoric mood. The patient is nervous/anxious.   All other systems reviewed and are negative.   Blood pressure 127/84, pulse (!) 118, height 5' 2 (1.575 m), weight 126 lb 12.8 oz (57.5 kg), last menstrual period 04/13/2015, SpO2 95%.Body mass index is 23.19 kg/m.  General Appearance: Casual and Fairly Groomed  Eye Contact:  Good  Speech:  Clear and Coherent  Volume:  Normal  Mood:  Anxious and Dysphoric  Affect:  Congruent  Thought Process:  Goal Directed  Orientation:  Full (Time, Place, and Person)  Thought Content: Rumination   Suicidal Thoughts:  No  Homicidal Thoughts:  No  Memory:  Immediate;   Good Recent;   Good Remote;   Good  Judgement:  Good  Insight:  Fair  Psychomotor Activity:  Normal  Concentration:  Concentration: Good and Attention Span: Good  Recall:  Good  Fund of Knowledge: Good  Language: Good  Akathisia:  No  Handed:  Right  AIMS (if indicated): not done  Assets:  Communication Skills Desire for Improvement Physical Health Resilience Social Support  ADL's:  Intact  Cognition: WNL  Sleep:  Poor   Screenings: GAD-7    Flowsheet Row Office Visit from 08/09/2023 in [facility] [place] Office Visit from 09/16/2022 in [facility] [place] [facility] Office Visit from 11/27/2021 in  [facility] Office Visit from 11/20/2021 in [facility]  Total GAD-7 Score 21 21 14 18    PHQ2-9    Flowsheet Row Office Visit from 09/06/2023 in [facility] Office Visit from 08/09/2023 in [facility] Office Visit from 09/16/2022 in [facility] Office Visit from 12/07/2021 in [facility] [place] [facility] Office Visit from 11/27/2021 in [facility]  PHQ-2 Total Score 0 6 6 0 4  PHQ-9 Total Score 0 18 22 -- 18   Flowsheet Row Office Visit from 08/20/2021 in [facility] [place] Video Visit from 05/18/2021 in [facility] [place] Video Visit from 02/18/2021 in [facility] [place]  C-SSRS RISK CATEGORY No Risk No Risk No Risk     Assessment and Plan: This patient is a 58 year old female with a history of bipolar disorder anxiety and insomnia.  She is extremely stressed taking care of so many people who are dependent on her.  I urged her to take time out but she does not seem willing to do so.  She states that she slept better with the higher trazodone  so we will increase it to 100 mg at bedtime.  She wants to stay with the Ambien  CR 12.5 mg at bedtime for sleep.  She will continue Xanax  1 mg 3 times daily for anxiety and Depakote  ER 1000 mg at bedtime for mood stabilization.  Collaboration of Care: Collaboration of Care: Primary Care Provider AEB notes will be shared with PCP at patient's request  Patient/Guardian was advised Release of Information must be obtained prior to any record release in order to collaborate their care with an outside provider. Patient/Guardian was advised if they have not already done so to contact the registration department to sign all necessary forms in order for us  to release information regarding their care.    Consent: Patient/Guardian gives verbal consent for treatment and assignment of benefits for services provided during this visit. Patient/Guardian expressed understanding and  agreed to proceed.    Barnie Gull, MD 09/27/2023, 4:19 PM

## 2023-10-04 ENCOUNTER — Telehealth: Payer: Self-pay | Admitting: *Deleted

## 2023-10-04 NOTE — Telephone Encounter (Signed)
 Received a medication request for dexlansoprazole . Pt last OV 11/05/2021

## 2023-10-05 NOTE — Telephone Encounter (Signed)
 Forward to front office to make OV.

## 2023-10-19 ENCOUNTER — Other Ambulatory Visit: Payer: Self-pay | Admitting: Gastroenterology

## 2023-10-19 ENCOUNTER — Telehealth: Payer: Self-pay

## 2023-10-19 DIAGNOSIS — K219 Gastro-esophageal reflux disease without esophagitis: Secondary | ICD-10-CM

## 2023-10-19 MED ORDER — DEXLANSOPRAZOLE 60 MG PO CPDR: 1.0000 | DELAYED_RELEASE_CAPSULE | Freq: Every day | ORAL | 0 refills | Status: DC

## 2023-10-19 NOTE — Telephone Encounter (Signed)
 Pt called stating that she has an appt scheduled fro 11/15/2023 and is wanting to know if she can get a refill to last to her appt.

## 2023-10-20 ENCOUNTER — Other Ambulatory Visit (HOSPITAL_COMMUNITY): Payer: Self-pay | Admitting: Family Medicine

## 2023-10-20 DIAGNOSIS — Z1231 Encounter for screening mammogram for malignant neoplasm of breast: Secondary | ICD-10-CM

## 2023-10-20 NOTE — Telephone Encounter (Signed)
 Pt was made aware and verbalized understanding.

## 2023-11-15 ENCOUNTER — Encounter: Payer: Self-pay | Admitting: Gastroenterology

## 2023-11-15 ENCOUNTER — Ambulatory Visit: Payer: PRIVATE HEALTH INSURANCE | Admitting: Gastroenterology

## 2023-11-15 VITALS — BP 132/84 | HR 82 | Temp 98.6°F | Ht 62.0 in | Wt 133.0 lb

## 2023-11-15 DIAGNOSIS — R1310 Dysphagia, unspecified: Secondary | ICD-10-CM | POA: Diagnosis not present

## 2023-11-15 DIAGNOSIS — Z1211 Encounter for screening for malignant neoplasm of colon: Secondary | ICD-10-CM

## 2023-11-15 DIAGNOSIS — K5900 Constipation, unspecified: Secondary | ICD-10-CM | POA: Diagnosis not present

## 2023-11-15 DIAGNOSIS — K219 Gastro-esophageal reflux disease without esophagitis: Secondary | ICD-10-CM | POA: Diagnosis not present

## 2023-11-15 NOTE — Patient Instructions (Signed)
 Continue dexilant  60 mg daily before breakfast for acid reflux.  You will be due colonoscopy in 01/2024. You will receive a reminder letter. Let us  know when you are ready to schedule.   Monitor your abdominal bloating and discomfort. If you have increasing issues, please let me know.   Continue your miralax daily to keep bowels moving.

## 2023-11-15 NOTE — Progress Notes (Signed)
 GI Office Note    Referring Provider: Alphonsa Glendia LABOR, MD Primary Care Physician:  Alphonsa Glendia LABOR, MD  Primary Gastroenterologist: Carlin POUR. Cindie, DO   Chief Complaint   Chief Complaint  Patient presents with   Follow-up    Pt here for refills. Follow up for constipation    History of Present Illness   Anne Logan is a 57 y.o. female presenting today for follow up of constipation and GERD. She is due for refills of Dexilant .    Doing well from GI standpoint.  She takes Dexilant  60 mg daily before breakfast.  Reflux is well-controlled.  Denies abdominal pain.  No vomiting.  No dysphagia to solids or liquids.  Occasionally has difficulty with pills but does not desire any further workup for this.  Weight has been stable.  She maintains fairly regular stools with Miralax. Some mild bloating at times. No melena, brbpr. She is due for colonoscopy in 01/2024, not ready to schedule at this time.    Prior Data   EGD October 2022: - Mild Schatzki ring status post dilation - Gastritis, slight chronic inflammation, negative for H. pylori - Normal duodenal bulb, first portion of duodenum and second portion duodenum  Colonoscopy January 2021: - Good prep with extensive lavage.  Polyps less than 3 mm would be missed. - Significant looping of the colon - Internal hemorrhoids - Moderate diverticulosis with restricted mobility of the rectosigmoid colon - Repeat colonoscopy in 5 years for surveillance due to prep quality   Medications   Current Outpatient Medications  Medication Sig Dispense Refill   ALPRAZolam  (XANAX ) 1 MG tablet Take 1 tablet (1 mg total) by mouth 3 (three) times daily as needed for anxiety. 90 tablet 2   aspirin-acetaminophen -caffeine (EXCEDRIN MIGRAINE) 250-250-65 MG tablet Take 2 tablets by mouth every 8 (eight) hours as needed for headache.      dexlansoprazole  (DEXILANT ) 60 MG capsule Take 1 capsule (60 mg total) by mouth daily. 30 capsule 0    divalproex  (DEPAKOTE  ER) 500 MG 24 hr tablet Take 2 tablets (1,000 mg total) by mouth daily. 60 tablet 2   Omega-3 Fatty Acids (FISH OIL PO) Take by mouth.     QUEtiapine  (SEROQUEL ) 100 MG tablet Take 1 tablet (100 mg total) by mouth at bedtime. 30 tablet 2   zolpidem  (AMBIEN  CR) 12.5 MG CR tablet Take 1 tablet (12.5 mg total) by mouth at bedtime as needed for sleep. 30 tablet 2   No current facility-administered medications for this visit.    Allergies   Allergies as of 11/15/2023 - Review Complete 11/15/2023  Allergen Reaction Noted   Other Nausea Only 01/02/2019   Protonix  [pantoprazole  sodium]  12/18/2018        Review of Systems   General: Negative for anorexia, weight loss, fever, chills, fatigue, weakness. ENT: Negative for hoarseness, difficulty swallowing , nasal congestion. CV: Negative for chest pain, angina, palpitations, dyspnea on exertion, peripheral edema.  Respiratory: Negative for dyspnea at rest, dyspnea on exertion, cough, sputum, wheezing.  GI: See history of present illness. GU:  Negative for dysuria, hematuria, urinary incontinence, urinary frequency, nocturnal urination.  Endo: Negative for unusual weight change.     Physical Exam   BP 132/84   Pulse 82   Temp 98.6 F (37 C)   Ht 5' 2 (1.575 m)   Wt 133 lb (60.3 kg)   LMP 04/13/2015   BMI 24.33 kg/m    General: Well-nourished, well-developed in no  acute distress.  Eyes: No icterus. Mouth: Oropharyngeal mucosa moist and pink   Lungs: Clear to auscultation bilaterally.  Heart: Regular rate and rhythm, no murmurs rubs or gallops.  Abdomen: Bowel sounds are normal, nontender, nondistended, no hepatosplenomegaly or masses,  no abdominal bruits or hernia , no rebound or guarding.  Rectal: not performed Extremities: No lower extremity edema. No clubbing or deformities. Neuro: Alert and oriented x 4   Skin: Warm and dry, no jaundice.   Psych: Alert and cooperative, normal mood and affect.  Labs    Lab Results  Component Value Date   HGBA1C 6.5 (H) 09/16/2022   Lab Results  Component Value Date   ALT 9 02/08/2022   AST 12 02/08/2022   ALKPHOS 61 02/08/2022   BILITOT <0.2 02/08/2022   Lab Results  Component Value Date   WBC 4.8 11/27/2021   HGB 13.6 11/27/2021   HCT 41.1 11/27/2021   MCV 91 11/27/2021   PLT 271 11/27/2021   Lab Results  Component Value Date   NA 142 11/27/2021   CL 104 11/27/2021   K 4.3 11/27/2021   CO2 25 11/27/2021   BUN 16 11/27/2021   CREATININE 0.60 11/27/2021   EGFR 105 11/27/2021   CALCIUM  9.4 11/27/2021   ALBUMIN 4.5 02/08/2022   GLUCOSE 117 (H) 11/27/2021    Imaging Studies   No results found.  Assessment/Plan:   GERD: -Well-controlled on Dexilant  60 mg daily. -Continue anti-reflux measures    Dysphagia: Intermittent pill dysphagia, otherwise no food or liquid dysphagia.  Last EGD October 2022 with mild Schatzki's ring s/p dilation.  Patient does not feel she needs any further evaluation/management at this time. Continue to monitor.  Constipation: -continue miralax 1-2 capfuls daily to keep BMs regular.  Colon cancer screening/History of compromised bowel prep: -due for colonoscopy 01/2024 -she will need good bowel prep, likely two day -make sure bowels moving well prior to colonoscopy prep    Sonny RAMAN. Ezzard, MHS, PA-C [facility]

## 2023-11-21 ENCOUNTER — Encounter: Payer: Self-pay | Admitting: Gastroenterology

## 2023-11-21 ENCOUNTER — Telehealth: Payer: Self-pay | Admitting: Gastroenterology

## 2023-11-21 DIAGNOSIS — Z1211 Encounter for screening for malignant neoplasm of colon: Secondary | ICD-10-CM | POA: Insufficient documentation

## 2023-11-21 DIAGNOSIS — K219 Gastro-esophageal reflux disease without esophagitis: Secondary | ICD-10-CM

## 2023-11-21 DIAGNOSIS — K5900 Constipation, unspecified: Secondary | ICD-10-CM | POA: Insufficient documentation

## 2023-11-21 NOTE — Telephone Encounter (Signed)
 Patient called and said she was seen last week and was under the impression a medication was going to be called in but it isn't at the drugstore.  She said the drugstore was going to fax you something.... she is completely out.  I did not get the name of the medication, sorry.

## 2023-11-21 NOTE — Telephone Encounter (Signed)
 Pt is needing refills on dexlansoprazole  sent to [facility].

## 2023-11-22 MED ORDER — DEXLANSOPRAZOLE 60 MG PO CPDR: 1.0000 | DELAYED_RELEASE_CAPSULE | Freq: Every day | ORAL | 3 refills | Status: AC

## 2023-11-28 ENCOUNTER — Ambulatory Visit (HOSPITAL_COMMUNITY)
Admission: RE | Admit: 2023-11-28 | Discharge: 2023-11-28 | Disposition: A | Discharge status: Home / Self Care | Payer: PRIVATE HEALTH INSURANCE | Source: Ambulatory Visit | Attending: Family Medicine | Admitting: Family Medicine

## 2023-11-28 ENCOUNTER — Encounter (HOSPITAL_COMMUNITY): Payer: Self-pay

## 2023-11-28 DIAGNOSIS — Z1231 Encounter for screening mammogram for malignant neoplasm of breast: Secondary | ICD-10-CM | POA: Insufficient documentation

## 2023-12-21 ENCOUNTER — Encounter (INDEPENDENT_AMBULATORY_CARE_PROVIDER_SITE_OTHER): Payer: Self-pay | Admitting: *Deleted

## 2023-12-23 ENCOUNTER — Other Ambulatory Visit (HOSPITAL_COMMUNITY): Payer: Self-pay | Admitting: Psychiatry

## 2023-12-27 ENCOUNTER — Encounter (HOSPITAL_COMMUNITY): Payer: Self-pay | Admitting: Psychiatry

## 2023-12-27 ENCOUNTER — Telehealth (HOSPITAL_COMMUNITY): Payer: PRIVATE HEALTH INSURANCE | Admitting: Psychiatry

## 2023-12-27 DIAGNOSIS — F3175 Bipolar disorder, in partial remission, most recent episode depressed: Secondary | ICD-10-CM

## 2023-12-27 MED ORDER — ZOLPIDEM TARTRATE ER 12.5 MG PO TBCR: 12.5000 mg | EXTENDED_RELEASE_TABLET | Freq: Every evening | ORAL | 2 refills | Status: AC | PRN

## 2023-12-27 MED ORDER — DIVALPROEX SODIUM ER 500 MG PO TB24: 1000.0000 mg | ORAL_TABLET | Freq: Every day | ORAL | 2 refills | Status: AC

## 2023-12-27 MED ORDER — QUETIAPINE FUMARATE 100 MG PO TABS: 100.0000 mg | ORAL_TABLET | Freq: Every day | ORAL | 2 refills | Status: AC

## 2023-12-27 MED ORDER — ALPRAZOLAM 1 MG PO TABS: 1.0000 mg | ORAL_TABLET | Freq: Three times a day (TID) | ORAL | 2 refills | Status: AC | PRN

## 2023-12-27 NOTE — Progress Notes (Signed)
 Virtual Visit via Video Note  I connected with Anne Logan on 12/27/23 at  4:20 PM EST by a video enabled telemedicine application and verified that I am speaking with the correct person using two identifiers.  Location: Patient: home Provider: office   I discussed the limitations of evaluation and management by telemedicine and the availability of in person appointments. The patient expressed understanding and agreed to proceed.      I discussed the assessment and treatment plan with the patient. The patient was provided an opportunity to ask questions and all were answered. The patient agreed with the plan and demonstrated an understanding of the instructions.   The patient was advised to call back or seek an in-person evaluation if the symptoms worsen or if the condition fails to improve as anticipated.  I provided 20 minutes of non-face-to-face time during this encounter.   Barnie Gull, MD  [facility] MD/PA/NP OP Progress Note  12/27/2023 4:35 PM Anne Logan  MRN:  [id number]  Chief Complaint:  Chief Complaint  Patient presents with   Anxiety   Depression   Follow-up   HPI:  This patient is a 58 year old married white female lives with her husband in [place]. 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   The patient returns for follow-up after 3 months regarding her history of bipolar disorder depression and anxiety.  She again seems tired and stressed.  She is working from home but her husband is home with his renal failure pancreatitis and COPD and constantly asked for her assistance.  She is also trying to help her mother who has dementia.  She does not take much time out for herself.  She was not sleeping well and last time I added back the Seroquel  at her request.  She is sleeping a little better now but still not enough.  She does not really want to change her medications at this point.  She still feels that they are  helpful for her depression and anxiety. Visit Diagnosis:    ICD-10-CM   1. Bipolar disorder, in partial remission, most recent episode depressed (HCC)  F31.75       Past Psychiatric History: Hospitalization many years ago.  At that time she was abusing Xanax  and Valium  Past Medical History:  Past Medical History:  Diagnosis Date   Anxiety    Depression    GERD (gastroesophageal reflux disease)     Past Surgical History:  Procedure Laterality Date   BALLOON DILATION N/A 10/28/2020   Procedure: BALLOON DILATION;  Surgeon: Cindie Carlin POUR, DO;  [facility];  Service: Endoscopy;  Laterality: N/A;   BIOPSY  01/23/2019   Procedure: BIOPSY;  Surgeon: Harvey Margo CROME, MD;  [facility];  Service: Endoscopy;;  duodenum gastric   BIOPSY  10/28/2020   Procedure: BIOPSY;  Surgeon: Cindie Carlin POUR, DO;  [facility];  Service: Endoscopy;;   COLONOSCOPY WITH PROPOFOL  N/A 01/23/2019   Dr. Harvey: Significant looping of the colon, internal hemorrhoids, moderate diverticulosis with restricted mobility of the rectosigmoid colon.  Recommended 5-year follow-up colonoscopy due to prep, polyps less than 3 mm could have been missed.   ESOPHAGOGASTRODUODENOSCOPY (EGD) WITH PROPOFOL  N/A 01/23/2019   Dr. Harvey: Benign-appearing esophageal stricture status post dilation.  Gastritis, no H. pylori.  Small bowel biopsies benign, no celiac.   ESOPHAGOGASTRODUODENOSCOPY (EGD) WITH PROPOFOL  N/A 10/28/2020   Surgeon: Cindie Carlin POUR, DO;  mild Schatzki's ring s/p dilation, gastritis biopsied, normal examined duodenum.  Pathology with antral and oxyntic mucosa with slight chronic inflammation, negative for H. pylori.   SAVORY DILATION N/A 01/23/2019   Procedure: SAVORY DILATION;  Surgeon: Harvey Margo CROME, MD;  [facility];  Service: Endoscopy;  Laterality: N/A;   TUBAL LIGATION Bilateral 2005    Family Psychiatric History: See below  Family History:  Family  History  Problem Relation Age of Onset   Depression Mother    Cancer Mother        lymphoma   Alcohol abuse Father    Drug abuse Son    Colon cancer Neg Hx     Social History:  Social History   Socioeconomic History   Marital status: Married    Spouse name: Not on file   Number of children: Not on file   Years of education: Not on file   Highest education level: Not on file  Occupational History   Not on file  Tobacco Use   Smoking status: Every Day    Current packs/day: 0.50    Average packs/day: 0.5 packs/day for 20.0 years (10.0 ttl pk-yrs)    Types: Cigarettes   Smokeless tobacco: Never   Tobacco comments:    smokes 10 cig daily  Vaping Use   Vaping status: Never Used  Substance and Sexual Activity   Alcohol use: Not Currently    Alcohol/week: 0.0 standard drinks of alcohol    Comment: quit 2 years ago   Drug use: No   Sexual activity: Not Currently    Partners: Male    Birth control/protection: Surgical    Comment: tubal  Other Topics Concern   Not on file  Social History Narrative   Not on file   Social Drivers of Health   Financial Resource Strain: Not on file  Food Insecurity: Not on file  Transportation Needs: Not on file  Physical Activity: Not on file  Stress: Not on file  Social Connections: Not on file    Allergies:  Allergies  Allergen Reactions   Other Nausea Only    Per patient Pain medications, any kind   Protonix  [Pantoprazole  Sodium]     Patient reported sore tongue after 2 days of pantoprazole .  No swelling reported.    Metabolic Disorder Labs: Lab Results  Component Value Date   HGBA1C 6.5 (H) 09/16/2022   No results found for: PROLACTIN Lab Results  Component Value Date   CHOL 161 02/08/2022   TRIG 239 (H) 02/08/2022   HDL 48 02/08/2022   CHOLHDL 3.4 02/08/2022   VLDL 37 (H) 05/01/2015   LDLCALC 74 02/08/2022   LDLCALC 171 (H) 11/27/2021   Lab Results  Component Value Date   TSH 0.697 11/27/2021   TSH 0.58  06/13/2020    Therapeutic Level Labs: No results found for: LITHIUM Lab Results  Component Value Date   VALPROATE 69.0 01/31/2020   VALPROATE 88 02/08/2018   No results found for: CBMZ  Current Medications: Current Outpatient Medications  Medication Sig Dispense Refill   ALPRAZolam  (XANAX ) 1 MG tablet Take 1 tablet (1 mg total) by mouth 3 (three) times daily as needed for anxiety. 90 tablet 2   aspirin-acetaminophen -caffeine (EXCEDRIN MIGRAINE) 250-250-65 MG tablet Take 2 tablets by mouth every 8 (eight) hours as needed for headache.      dexlansoprazole  (DEXILANT ) 60 MG capsule Take 1 capsule (60 mg total) by mouth daily. 90 capsule 3   divalproex  (DEPAKOTE  ER) 500  MG 24 hr tablet Take 2 tablets (1,000 mg total) by mouth daily. 60 tablet 2   Omega-3 Fatty Acids (FISH OIL PO) Take by mouth.     QUEtiapine  (SEROQUEL ) 100 MG tablet Take 1 tablet (100 mg total) by mouth at bedtime. 30 tablet 2   zolpidem  (AMBIEN  CR) 12.5 MG CR tablet Take 1 tablet (12.5 mg total) by mouth at bedtime as needed for sleep. 30 tablet 2   No current facility-administered medications for this visit.     Musculoskeletal: Strength & Muscle Tone: within normal limits Gait & Station: normal Patient leans: N/A  Psychiatric Specialty Exam: Review of Systems  Constitutional:  Positive for fatigue.  Psychiatric/Behavioral:  Positive for sleep disturbance.   All other systems reviewed and are negative.   Last menstrual period 04/13/2015.There is no height or weight on file to calculate BMI.  General Appearance: Casual and Fairly Groomed  Eye Contact:  Good  Speech:  Clear and Coherent  Volume:  Normal  Mood:  Dysphoric  Affect:  Flat  Thought Process:  Goal Directed  Orientation:  Full (Time, Place, and Person)  Thought Content: Rumination   Suicidal Thoughts:  No  Homicidal Thoughts:  No  Memory:  Immediate;   Good Recent;   Good Remote;   Good  Judgement:  Good  Insight:  Fair  Psychomotor  Activity:  Normal  Concentration:  Concentration: Good and Attention Span: Good  Recall:  Good  Fund of Knowledge: Good  Language: Good  Akathisia:  No  Handed:  Right  AIMS (if indicated): not done  Assets:  Communication Skills Desire for Improvement Resilience Social Support  ADL's:  Intact  Cognition: WNL  Sleep:  Fair   Screenings: GAD-7    Flowsheet Row Office Visit from 08/09/2023 in [facility] Office Visit from 09/16/2022 in [facility] Office Visit from 11/27/2021 in [facility] [place] [facility] Office Visit from 11/20/2021 in [facility]  Total GAD-7 Score 21 21 14 18    PHQ2-9    Flowsheet Row Office Visit from 09/06/2023 in [facility] [place] [facility] Office Visit from 08/09/2023 in [facility] [place] [facility] Office Visit from 09/16/2022 in [facility] [place] [facility] Office Visit from 12/07/2021 in [facility] Office Visit from 11/27/2021 in [facility] [place] [facility]  PHQ-2 Total Score 0 6 6 0 4  PHQ-9 Total Score 0 18 22 -- 18   Flowsheet Row Office Visit from 08/20/2021 in [facility] [place] Video Visit from 05/18/2021 in [facility] [place] Video Visit from 02/18/2021 in [facility] [place]  C-SSRS RISK CATEGORY No Risk No Risk No Risk     Assessment and Plan: This patient is a 58 year old female with a history of bipolar disorder generalized anxiety and insomnia.  She is again overextending herself by taking care of too many people but she does not seem to want to make changes.  She does think her medications are helpful so she will continue Seroquel  100 mg at bedtime for mood stabilization and sleep, Ambien  CR 12.5 mg at bedtime for sleep, Depakote  ER 1000 mg at bedtime for mood stabilization and Xanax  1 mg 3  times daily for generalized anxiety.  She will return to see me in 3 months  Collaboration of Care: Collaboration of Care: Primary Care Provider AEB notes will be shared with PCP at  patient's request  Patient/Guardian was advised Release of Information must be obtained prior to any record release in order to collaborate their care with an outside provider. Patient/Guardian was advised if they have not already done so to contact the registration department to sign all necessary forms in order for us  to release information regarding their care.   Consent: Patient/Guardian gives verbal consent for treatment and assignment of benefits for services provided during this visit. Patient/Guardian expressed understanding and agreed to proceed.    Barnie Gull, MD 12/27/2023, 4:35 PM

## 2024-01-14 ENCOUNTER — Other Ambulatory Visit (HOSPITAL_COMMUNITY): Payer: Self-pay | Admitting: Psychiatry

## 2024-03-26 ENCOUNTER — Telehealth (HOSPITAL_COMMUNITY): Payer: PRIVATE HEALTH INSURANCE | Admitting: Psychiatry
